# Patient Record
Sex: Male | Born: 1950
Health system: Southern US, Community
[De-identification: ages and names within clinical notes are randomized; demographics above are authoritative.]

## PROBLEM LIST (undated history)

## (undated) DIAGNOSIS — I5020 Unspecified systolic (congestive) heart failure: Secondary | ICD-10-CM

## (undated) DIAGNOSIS — I498 Other specified cardiac arrhythmias: Secondary | ICD-10-CM

## (undated) DIAGNOSIS — Z720 Tobacco use: Secondary | ICD-10-CM

## (undated) DIAGNOSIS — I739 Peripheral vascular disease, unspecified: Secondary | ICD-10-CM

## (undated) DIAGNOSIS — I255 Ischemic cardiomyopathy: Secondary | ICD-10-CM

## (undated) DIAGNOSIS — G4733 Obstructive sleep apnea (adult) (pediatric): Secondary | ICD-10-CM

## (undated) DIAGNOSIS — I509 Heart failure, unspecified: Secondary | ICD-10-CM

## (undated) DIAGNOSIS — E119 Type 2 diabetes mellitus without complications: Secondary | ICD-10-CM

## (undated) DIAGNOSIS — I10 Essential (primary) hypertension: Secondary | ICD-10-CM

## (undated) DIAGNOSIS — I2510 Atherosclerotic heart disease of native coronary artery without angina pectoris: Secondary | ICD-10-CM

## (undated) DIAGNOSIS — E785 Hyperlipidemia, unspecified: Secondary | ICD-10-CM

## (undated) HISTORY — DX: Peripheral vascular disease, unspecified: I73.9

## (undated) HISTORY — DX: Hyperlipidemia, unspecified: E78.5

## (undated) HISTORY — DX: Type 2 diabetes mellitus without complications: E11.9

## (undated) HISTORY — DX: Essential (primary) hypertension: I10

## (undated) HISTORY — DX: Tobacco use: Z72.0

## (undated) HISTORY — DX: Ischemic cardiomyopathy: I25.5

## (undated) HISTORY — DX: Atherosclerotic heart disease of native coronary artery without angina pectoris: I25.10

## (undated) HISTORY — DX: Unspecified systolic (congestive) heart failure: I50.20

## (undated) HISTORY — PX: CARDIAC CATHETERIZATION: SHX172

## (undated) HISTORY — PX: CORONARY ANGIOPLASTY: SHX604

## (undated) HISTORY — DX: Other specified cardiac arrhythmias: I49.8

## (undated) HISTORY — DX: Heart failure, unspecified: I50.9

---

## 1999-07-04 ENCOUNTER — Encounter: Payer: Self-pay | Admitting: Cardiovascular Disease

## 2004-11-25 ENCOUNTER — Emergency Department: Payer: Self-pay | Admitting: Internal Medicine

## 2005-02-26 ENCOUNTER — Ambulatory Visit: Payer: Self-pay | Admitting: General Practice

## 2007-03-17 ENCOUNTER — Ambulatory Visit: Payer: Self-pay | Admitting: Gastroenterology

## 2007-04-09 ENCOUNTER — Ambulatory Visit: Payer: Self-pay | Admitting: General Surgery

## 2007-04-15 ENCOUNTER — Ambulatory Visit: Payer: Self-pay | Admitting: General Surgery

## 2007-05-14 ENCOUNTER — Ambulatory Visit: Payer: Self-pay | Admitting: General Surgery

## 2007-05-14 ENCOUNTER — Other Ambulatory Visit: Payer: Self-pay

## 2007-05-21 ENCOUNTER — Ambulatory Visit: Payer: Self-pay | Admitting: General Surgery

## 2007-09-01 ENCOUNTER — Emergency Department: Payer: Self-pay | Admitting: Emergency Medicine

## 2009-06-15 ENCOUNTER — Ambulatory Visit: Payer: Self-pay

## 2009-07-24 ENCOUNTER — Ambulatory Visit: Payer: Self-pay

## 2010-08-07 ENCOUNTER — Encounter: Payer: Self-pay | Admitting: Cardiovascular Disease

## 2010-08-07 ENCOUNTER — Inpatient Hospital Stay: Payer: Self-pay | Admitting: Internal Medicine

## 2010-12-21 ENCOUNTER — Encounter: Payer: Self-pay | Admitting: Cardiovascular Disease

## 2010-12-21 ENCOUNTER — Ambulatory Visit (INDEPENDENT_AMBULATORY_CARE_PROVIDER_SITE_OTHER): Payer: BC Managed Care – PPO | Admitting: Cardiovascular Disease

## 2010-12-21 DIAGNOSIS — I4902 Ventricular flutter: Secondary | ICD-10-CM

## 2010-12-21 DIAGNOSIS — E785 Hyperlipidemia, unspecified: Secondary | ICD-10-CM | POA: Insufficient documentation

## 2010-12-21 DIAGNOSIS — I471 Supraventricular tachycardia, unspecified: Secondary | ICD-10-CM

## 2010-12-21 DIAGNOSIS — I2510 Atherosclerotic heart disease of native coronary artery without angina pectoris: Secondary | ICD-10-CM | POA: Insufficient documentation

## 2010-12-21 DIAGNOSIS — I10 Essential (primary) hypertension: Secondary | ICD-10-CM

## 2010-12-21 DIAGNOSIS — I420 Dilated cardiomyopathy: Secondary | ICD-10-CM | POA: Insufficient documentation

## 2010-12-21 DIAGNOSIS — Z720 Tobacco use: Secondary | ICD-10-CM | POA: Insufficient documentation

## 2010-12-21 DIAGNOSIS — I498 Other specified cardiac arrhythmias: Secondary | ICD-10-CM

## 2010-12-21 DIAGNOSIS — R002 Palpitations: Secondary | ICD-10-CM

## 2010-12-21 DIAGNOSIS — I428 Other cardiomyopathies: Secondary | ICD-10-CM

## 2010-12-21 DIAGNOSIS — F17200 Nicotine dependence, unspecified, uncomplicated: Secondary | ICD-10-CM

## 2010-12-21 HISTORY — DX: Supraventricular tachycardia: I47.1

## 2010-12-21 HISTORY — DX: Essential (primary) hypertension: I10

## 2010-12-21 HISTORY — DX: Atherosclerotic heart disease of native coronary artery without angina pectoris: I25.10

## 2010-12-21 HISTORY — DX: Supraventricular tachycardia, unspecified: I47.10

## 2010-12-21 NOTE — Assessment & Plan Note (Signed)
Cholesterol is at goal on the current lipid regimen. No changes to the medications were made.  

## 2010-12-21 NOTE — Assessment & Plan Note (Signed)
Mild proximal RCA disease, moderate to severe distal PDA disease. He appears to be asymptomatic. Would continue lipid management and smoking cessation

## 2010-12-21 NOTE — Assessment & Plan Note (Addendum)
History of nonischemic heart myopathy. No echocardiogram has been done either at [facility] or through [place] per the patient. He has significant fatigue on carvedilol and will decrease the dose to 6.25 mg b.i.d. We have suggested he have an echocardiogram done to establish his ejection fraction, LV size and determine if he is a candidate for a defibrillator.

## 2010-12-21 NOTE — Patient Instructions (Addendum)
You are doing well. Please start amlodipine 10 mg daily (start a 1/2 pill for the first week) Decrease the carvedilol to 6.25 mg twice a day  We will schedule you for an echocardiogram. Your physician has requested that you have an echocardiogram. Echocardiography is a painless test that uses sound waves to create images of your heart. It provides your doctor with information about the size and shape of your heart and how well your heart's chambers and valves are working. This procedure takes approximately one hour. There are no restrictions for this procedure.  We will call you for a holter monitor Please call us if you have new issues that need to be addressed before your next appt.  We will call you for a follow up Appt. In 1 months

## 2010-12-21 NOTE — Assessment & Plan Note (Signed)
Blood pressure is elevated. His wife reports it is also high at home. We will start amlodipine 5 mg for one week, titrating to 10 mg daily if needed to maintain systolic pressure less than 140.

## 2010-12-21 NOTE — Assessment & Plan Note (Signed)
We have counseled him on smoking cessation. We have encouraged him to try over-the-counter nicotine patches or electronic cigarette

## 2010-12-21 NOTE — Progress Notes (Signed)
Patient ID: Alexander Cobb, male    DOB: 05-Aug-1950, 60 y.o.   MRN: [id number]  HPI Comments: [name] is a very pleasant 60 year old gentleman with a history of nonischemic cardiopathy, Non-STEMI, ejection fraction by catheterization estimated at 33% in May of 2012, mild proximal RCA disease, 75% PDA disease, who presents to establish care. He reports that he was unhappy with his care at [facility].  He has not felt well after various medication changes. His wife reports that his blood pressure has been chronically elevated. 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 He states that he has not had a echocardiogram. He did not have an echocardiogram in May and has not had one since then in followup on medical management. He does report that someone talked about a defibrillator at the time when he was first admitted during his catheterization there was had no further followup.  He denies any significant lower extremity edema. He does have occasional lightheadedness since his coronary was increased at the outside clinic  He does continue to smoke one pack a day Discharge summary from May of 2012 mentions he had episodes of SVT though there is no significant detail. He has had episodes of fluttering and is very symptomatic, symptoms lasting for more than 15 minutes at a time daily, sometimes every other day. His symptoms have not improved with higher dose Coreg.  No Holter has been done.  EKG shows sinus bradycardia with rate 56 beats per minute with no significant ST or T wave changes   Outpatient Encounter Prescriptions as of 12/21/2010  Medication Sig Dispense Refill  . Acetaminophen-Codeine 300-30 MG per tablet Take 1 tablet by mouth every 4 (four) hours as needed.        Marland Kitchen aspirin 81 MG tablet Take 81 mg by mouth daily.        . carvedilol (COREG) 12.5 MG tablet 18.5 mg. Take 1 and 1/2 tablet twice day.      Marland Kitchen glipiZIDE (GLUCOTROL XL) 2.5 MG 24 hr tablet Take 2.5 mg by  mouth daily.        Marland Kitchen lisinopril (PRINIVIL,ZESTRIL) 40 MG tablet Take 40 mg by mouth daily.        . methimazole (TAPAZOLE) 5 MG tablet Take 5 mg by mouth daily.        . pravastatin (PRAVACHOL) 20 MG tablet Take 20 mg by mouth daily.           Review of Systems  Constitutional: Positive for fatigue.  HENT: Negative.   Eyes: Negative.   Respiratory: Negative.        Mild shortness of breath  Cardiovascular: Negative.   Gastrointestinal: Negative.   Musculoskeletal: Negative.   Skin: Negative.   Neurological: Negative.   Hematological: Negative.   Psychiatric/Behavioral: Negative.   All other systems reviewed and are negative.    BP 163/92  Pulse 56  Ht 5\' 9"  (1.753 m)  Wt 213 lb (96.616 kg)  BMI 31.45 kg/m2  Physical Exam  Nursing note and vitals reviewed. Constitutional: He is oriented to person, place, and time. He appears well-developed and well-nourished.  HENT:  Head: Normocephalic.  Nose: Nose normal.  Mouth/Throat: Oropharynx is clear and moist.  Eyes: Conjunctivae are normal. Pupils are equal, round, and reactive to light.  Neck: Normal range of motion. Neck supple. No JVD present.  Cardiovascular: Normal rate, regular rhythm, S1 normal, S2 normal, normal heart sounds and intact distal pulses.  Exam reveals no gallop and no  friction rub.   No murmur heard. Pulmonary/Chest: Effort normal and breath sounds normal. No respiratory distress. He has no wheezes. He has no rales. He exhibits no tenderness.  Abdominal: Soft. Bowel sounds are normal. He exhibits no distension. There is no tenderness.  Musculoskeletal: Normal range of motion. He exhibits no edema and no tenderness.  Lymphadenopathy:    He has no cervical adenopathy.  Neurological: He is alert and oriented to person, place, and time. Coordination normal.  Skin: Skin is warm and dry. No rash noted. No erythema.  Psychiatric: He has a normal mood and affect. His behavior is normal. Judgment and thought  content normal.           Assessment and Plan

## 2010-12-21 NOTE — Assessment & Plan Note (Signed)
One node from the hospital suggest he had SVT. He continues to have fluttering. Given his car myopathy, I would also be concerned about a ventricular arrhythmia. We have ordered a Holter monitor. If we are unable to identify his arrhythmia in 48 hours, a 30 day monitor could be performed.

## 2010-12-25 ENCOUNTER — Telehealth: Payer: Self-pay | Admitting: *Deleted

## 2010-12-25 ENCOUNTER — Other Ambulatory Visit (INDEPENDENT_AMBULATORY_CARE_PROVIDER_SITE_OTHER): Payer: BC Managed Care – PPO | Admitting: *Deleted

## 2010-12-25 DIAGNOSIS — I2510 Atherosclerotic heart disease of native coronary artery without angina pectoris: Secondary | ICD-10-CM

## 2010-12-25 MED ORDER — AMLODIPINE BESYLATE 10 MG PO TABS: 10.0000 mg | ORAL_TABLET | Freq: Every day | ORAL | Status: DC

## 2010-12-25 NOTE — Telephone Encounter (Signed)
Per Dr. Mariah Milling, Amlodipine 10mg  once daily called in to pt's pharmacy. We did not have pharmacy information previously to add this. Pt now aware and will start.

## 2010-12-31 ENCOUNTER — Encounter: Payer: Self-pay | Admitting: *Deleted

## 2011-01-03 ENCOUNTER — Encounter: Payer: Self-pay | Admitting: Cardiovascular Disease

## 2011-06-11 ENCOUNTER — Encounter: Payer: Self-pay | Admitting: *Deleted

## 2011-06-13 ENCOUNTER — Encounter: Payer: Self-pay | Admitting: Cardiovascular Disease

## 2011-06-13 ENCOUNTER — Ambulatory Visit (INDEPENDENT_AMBULATORY_CARE_PROVIDER_SITE_OTHER): Payer: BC Managed Care – PPO | Admitting: Cardiovascular Disease

## 2011-06-13 VITALS — BP 120/72 | HR 64 | Ht 68.0 in | Wt 215.2 lb

## 2011-06-13 DIAGNOSIS — E785 Hyperlipidemia, unspecified: Secondary | ICD-10-CM

## 2011-06-13 DIAGNOSIS — I4902 Ventricular flutter: Secondary | ICD-10-CM

## 2011-06-13 DIAGNOSIS — F17200 Nicotine dependence, unspecified, uncomplicated: Secondary | ICD-10-CM

## 2011-06-13 DIAGNOSIS — I10 Essential (primary) hypertension: Secondary | ICD-10-CM

## 2011-06-13 DIAGNOSIS — I498 Other specified cardiac arrhythmias: Secondary | ICD-10-CM

## 2011-06-13 DIAGNOSIS — I428 Other cardiomyopathies: Secondary | ICD-10-CM

## 2011-06-13 DIAGNOSIS — I2510 Atherosclerotic heart disease of native coronary artery without angina pectoris: Secondary | ICD-10-CM

## 2011-06-13 NOTE — Assessment & Plan Note (Signed)
I suspect he had a run of SVT recently. We mentioned the various treatment options including a 30 day monitor. He will wait for now given his symptoms are rare.

## 2011-06-13 NOTE — Assessment & Plan Note (Signed)
Goal LDL less than 70. We have encouraged him to stay on his statin

## 2011-06-13 NOTE — Assessment & Plan Note (Signed)
Ejection fraction 40-45%. Stay on his lisinopril and beta blocker.

## 2011-06-13 NOTE — Assessment & Plan Note (Signed)
We have encouraged him to continue to work on weaning his cigarettes and smoking cessation. He will continue to work on this and does not want any assistance with chantix.  

## 2011-06-13 NOTE — Progress Notes (Signed)
Patient ID: Alexander Cobb, male    DOB: [birth year], 61 y.o.   MRN: [id number]  HPI Comments: Alexander Cobb is a very pleasant 61 year old gentleman with a history of smoking, diabetes, nonischemic cardiopathy, Non-STEMI, ejection fraction by catheterization estimated at 33% in May of 2012, mild proximal RCA disease, 75% PDA disease, who presents to establish care.   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  Echocardiogram done last year showed ejection fraction 40-45%. He denies any significant lower extremity edema. He does continue to smoke one pack a day Discharge summary from May of 2012 mentions he had episodes of SVT though there is no significant detail.  He reports having an episode of tachycardia several days ago lasting for 2 minutes with significant symptoms. It resolved without intervention. He has not had recent episodes for the past 2 months . He reports he has been tolerating his medications well. He does have a dull ache in his chest that waxes and wanes over the past 2 weeks. His wife is concerned about heart attack. Since no Holter has been done in the past.  EKG shows sinus bradycardia with rate 65 beats per minute with no significant ST or T wave changes   Outpatient Encounter Prescriptions as of 06/13/2011  Medication Sig Dispense Refill  . Acetaminophen-Codeine 300-30 MG per tablet Take 1 tablet by mouth every 4 (four) hours as needed.        Marland Kitchen amLODipine (NORVASC) 10 MG tablet Take 1 tablet (10 mg total) by mouth daily.  30 tablet  6  . aspirin 81 MG tablet Take 81 mg by mouth daily.        . carvedilol (COREG) 12.5 MG tablet Take 1/2 tablet twice day.      Marland Kitchen glimepiride (AMARYL) 2 MG tablet Take 2 mg by mouth daily before breakfast.      . glipiZIDE (GLUCOTROL XL) 2.5 MG 24 hr tablet Take 2.5 mg by mouth daily.        Marland Kitchen lisinopril (PRINIVIL,ZESTRIL) 40 MG tablet Take 40 mg by mouth daily.        . pioglitazone (ACTOS) 15 MG tablet Take 15 mg by mouth  daily.      . pravastatin (PRAVACHOL) 20 MG tablet Take 20 mg by mouth daily.           Review of Systems  Constitutional: Positive for fatigue.  HENT: Negative.   Eyes: Negative.   Respiratory: Negative.        Mild shortness of breath  Cardiovascular: Positive for palpitations.       Dull ache in the chest for 2 weeks  Gastrointestinal: Negative.   Musculoskeletal: Negative.   Skin: Negative.   Neurological: Negative.   Hematological: Negative.   Psychiatric/Behavioral: Negative.   All other systems reviewed and are negative.    BP 120/72  Pulse 64  Ht 5\' 8"  (1.727 m)  Wt 215 lb 4 oz (97.637 kg)  BMI 32.73 kg/m2  Physical Exam  Nursing note and vitals reviewed. Constitutional: He is oriented to person, place, and time. He appears well-developed and well-nourished.  HENT:  Head: Normocephalic.  Nose: Nose normal.  Mouth/Throat: Oropharynx is clear and moist.  Eyes: Conjunctivae are normal. Pupils are equal, round, and reactive to light.  Neck: Normal range of motion. Neck supple. No JVD present.  Cardiovascular: Normal rate, regular rhythm, S1 normal, S2 normal, normal heart sounds and intact distal pulses.  Exam reveals no gallop and no friction rub.  No murmur heard. Pulmonary/Chest: Effort normal and breath sounds normal. No respiratory distress. He has no wheezes. He has no rales. He exhibits no tenderness.  Abdominal: Soft. Bowel sounds are normal. He exhibits no distension. There is no tenderness.  Musculoskeletal: Normal range of motion. He exhibits no edema and no tenderness.  Lymphadenopathy:    He has no cervical adenopathy.  Neurological: He is alert and oriented to person, place, and time. Coordination normal.  Skin: Skin is warm and dry. No rash noted. No erythema.  Psychiatric: He has a normal mood and affect. His behavior is normal. Judgment and thought content normal.           Assessment and Plan

## 2011-06-13 NOTE — Patient Instructions (Addendum)
You are doing well. Please try raxena 500 mg twice a day for one week Then increase to 1000mg  twice a day for one week If chest discomfort improves, call the office for a prescription.  Please call us if you have new issues that need to be addressed before your next appt.  Your physician wants you to follow-up in: 6 months.  You will receive a reminder letter in the mail two months in advance. If you don't receive a letter, please call our office to schedule the follow-up appointment.

## 2011-06-13 NOTE — Assessment & Plan Note (Signed)
Uncertain if his dull ache is secondary to low-grade angina. He does have 75% PDA disease of a small vessel. We have mentioned possible treatment with long-acting nitroglycerin or ranexa. We will try Ranexa first with 500 mg twice a day titrating up to 1000 mg twice a day for symptom relief. If he has no leaf, we could try isosorbide mononitrate 30 mg daily with close monitoring of his blood pressure.

## 2011-06-13 NOTE — Assessment & Plan Note (Signed)
Blood pressure is well controlled on today's visit. No changes made to the medications. 

## 2011-08-03 ENCOUNTER — Other Ambulatory Visit: Payer: Self-pay | Admitting: Cardiovascular Disease

## 2011-12-13 ENCOUNTER — Ambulatory Visit: Payer: BC Managed Care – PPO | Admitting: Cardiovascular Disease

## 2011-12-27 ENCOUNTER — Ambulatory Visit: Payer: BC Managed Care – PPO | Admitting: Cardiovascular Disease

## 2012-01-15 ENCOUNTER — Other Ambulatory Visit: Payer: Self-pay

## 2012-01-15 MED ORDER — AMLODIPINE BESYLATE 10 MG PO TABS: 10.0000 mg | ORAL_TABLET | Freq: Every day | ORAL | Status: DC

## 2012-01-15 NOTE — Telephone Encounter (Signed)
Refill sent for amlodipine 10 mg take one tablet daily.  

## 2012-04-13 ENCOUNTER — Ambulatory Visit: Payer: Self-pay | Admitting: Internal Medicine

## 2012-04-14 ENCOUNTER — Ambulatory Visit (INDEPENDENT_AMBULATORY_CARE_PROVIDER_SITE_OTHER): Payer: BC Managed Care – PPO | Admitting: Cardiovascular Disease

## 2012-04-14 ENCOUNTER — Encounter: Payer: Self-pay | Admitting: Cardiovascular Disease

## 2012-04-14 VITALS — BP 130/70 | HR 76 | Ht 69.0 in | Wt 222.5 lb

## 2012-04-14 DIAGNOSIS — R072 Precordial pain: Secondary | ICD-10-CM | POA: Insufficient documentation

## 2012-04-14 DIAGNOSIS — I428 Other cardiomyopathies: Secondary | ICD-10-CM

## 2012-04-14 DIAGNOSIS — I420 Dilated cardiomyopathy: Secondary | ICD-10-CM

## 2012-04-14 DIAGNOSIS — I2510 Atherosclerotic heart disease of native coronary artery without angina pectoris: Secondary | ICD-10-CM

## 2012-04-14 DIAGNOSIS — F17200 Nicotine dependence, unspecified, uncomplicated: Secondary | ICD-10-CM

## 2012-04-14 DIAGNOSIS — E785 Hyperlipidemia, unspecified: Secondary | ICD-10-CM

## 2012-04-14 DIAGNOSIS — R0602 Shortness of breath: Secondary | ICD-10-CM

## 2012-04-14 DIAGNOSIS — R079 Chest pain, unspecified: Secondary | ICD-10-CM

## 2012-04-14 DIAGNOSIS — I10 Essential (primary) hypertension: Secondary | ICD-10-CM

## 2012-04-14 MED ORDER — NITROGLYCERIN 0.4 MG SL SUBL: 0.4000 mg | SUBLINGUAL_TABLET | SUBLINGUAL | Status: DC | PRN

## 2012-04-14 MED ORDER — CARVEDILOL 6.25 MG PO TABS: 6.2500 mg | ORAL_TABLET | Freq: Two times a day (BID) | ORAL | Status: DC

## 2012-04-14 NOTE — Assessment & Plan Note (Signed)
Chest pain is atypical, comes on at rest, not with exertion. We'll continue to treat this medically. We have recommended if symptoms get worse, that he call us for a stress Myoview, lexiscan  as he is unable to treadmill secondary to shortness of breath

## 2012-04-14 NOTE — Progress Notes (Signed)
Patient ID: Alexander Cobb, male    DOB: 06/30/1950, 62 y.o.   MRN: [id number]  HPI Comments: Mr. 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  Echocardiogram 2012 showed ejection fraction 40-45%. He denies any significant lower extremity edema. He does continue to smoke one pack a day Discharge summary from May of 2012 mentions he had episodes of SVT though there is no significant detail.  He is rare episodes of palpitations. His biggest complaint is a dull ache in the left side of his chest that feels deep, happens 2-3 times per week lasting for several hours at a time, typically at rest. No chest pain with exertion. He is typically very active on the job, up and down ladders and machinery. No symptoms while at work  Hemoglobin A1c is more than 10, he admits to having a bad diet  EKG shows sinus bradycardia with rate 76 beats per minute with no significant ST or T wave changes   Outpatient Encounter Prescriptions as of 04/14/2012  Medication Sig Dispense Refill  . amLODipine (NORVASC) 10 MG tablet Take 1 tablet (10 mg total) by mouth daily.  30 tablet  6  . aspirin 81 MG tablet Take 81 mg by mouth daily.        . carvedilol (COREG) 6.25 MG tablet Take 1 tablet (6.25 mg total) by mouth 2 (two) times daily with a meal.  60 tablet  11  . glimepiride (AMARYL) 2 MG tablet Take 2 mg by mouth daily before breakfast.      . glipiZIDE (GLUCOTROL XL) 2.5 MG 24 hr tablet Take 2.5 mg by mouth daily.        Marland Kitchen lisinopril (PRINIVIL,ZESTRIL) 40 MG tablet Take 40 mg by mouth daily.        . pioglitazone (ACTOS) 15 MG tablet Take 15 mg by mouth daily.      . pravastatin (PRAVACHOL) 20 MG tablet Take 20 mg by mouth daily.         . [DISCONTINUED] carvedilol (COREG) 12.5 MG tablet Take 1/2 tablet twice day.      . [DISCONTINUED] carvedilol (COREG) 6.25 MG tablet Take 1 tablet (6.25 mg total) by mouth 2 (two) times daily with a meal. Take 1/2 tablet twice day.  60 tablet  11  . nitroGLYCERIN (NITROSTAT) 0.4 MG SL tablet Place 1 tablet (0.4 mg total) under the tongue every 5 (five) minutes as needed for chest pain.  25 tablet  6  . [DISCONTINUED] Acetaminophen-Codeine 300-30 MG per tablet Take 1 tablet by mouth every 4 (four) hours as needed.        . [DISCONTINUED] ranolazine (RANEXA) 1000 MG SR tablet Take 1 tablet (1,000 mg total) by mouth 2 (two) times daily.         Review of Systems  Constitutional: Positive for fatigue.  HENT: Negative.   Eyes: Negative.   Respiratory: Negative.        Mild shortness of breath  Cardiovascular: Positive for palpitations.       Dull ache in the chest   Gastrointestinal: Negative.   Musculoskeletal: Negative.   Skin: Negative.   Neurological: Negative.   Hematological: Negative.   Psychiatric/Behavioral: Negative.   All other systems reviewed and are negative.  BP 130/70  Pulse 76  Ht 5\' 9"  (1.753 m)  Wt 222 lb 8 oz (100.925 kg)  BMI 32.86 kg/m2  Physical Exam  Nursing note and vitals reviewed. Constitutional: He is oriented to person, place, and time. He appears well-developed and well-nourished.  HENT:  Head: Normocephalic.  Nose: Nose normal.  Mouth/Throat: Oropharynx is clear and moist.  Eyes: Conjunctivae normal are normal. Pupils are equal, round, and reactive to light.  Neck: Normal range of motion. Neck supple. No JVD present.  Cardiovascular: Normal rate, regular rhythm, S1 normal, S2 normal, normal heart sounds and intact distal pulses.  Exam reveals no gallop and no friction rub.   No murmur heard. Pulmonary/Chest: Effort normal and breath sounds normal. No respiratory distress. He has no wheezes. He has no rales. He exhibits no tenderness.   Abdominal: Soft. Bowel sounds are normal. He exhibits no distension. There is no tenderness.  Musculoskeletal: Normal range of motion. He exhibits no edema and no tenderness.  Lymphadenopathy:    He has no cervical adenopathy.  Neurological: He is alert and oriented to person, place, and time. Coordination normal.  Skin: Skin is warm and dry. No rash noted. No erythema.  Psychiatric: He has a normal mood and affect. His behavior is normal. Judgment and thought content normal.           Assessment and Plan

## 2012-04-14 NOTE — Assessment & Plan Note (Signed)
Would continue pravastatin. Goal LDL less than 70

## 2012-04-14 NOTE — Assessment & Plan Note (Signed)
Shortness of breath is stable. No signs of systolic heart failure. Continue current medications.

## 2012-04-14 NOTE — Assessment & Plan Note (Signed)
Blood pressure is well controlled on today's visit. No changes made to the medications. 

## 2012-04-14 NOTE — Assessment & Plan Note (Signed)
We have encouraged him to continue to work on weaning his cigarettes and smoking cessation. He will continue to work on this and does not want any assistance with chantix.  

## 2012-04-14 NOTE — Patient Instructions (Addendum)
If you have worsening chest pain, call the office Take NTG for bad chest pain  Please call us if you have new issues that need to be addressed before your next appt.  Your physician wants you to follow-up in: 6 months.  You will receive a reminder letter in the mail two months in advance. If you don't receive a letter, please call our office to schedule the follow-up appointment.

## 2012-09-22 ENCOUNTER — Other Ambulatory Visit: Payer: Self-pay | Admitting: *Deleted

## 2012-09-22 MED ORDER — AMLODIPINE BESYLATE 10 MG PO TABS: 10.0000 mg | ORAL_TABLET | Freq: Every day | ORAL | Status: DC

## 2012-09-22 NOTE — Telephone Encounter (Signed)
Refilled Amlodipine sent to [facility].

## 2012-10-05 ENCOUNTER — Ambulatory Visit (INDEPENDENT_AMBULATORY_CARE_PROVIDER_SITE_OTHER): Payer: BC Managed Care – PPO | Admitting: Cardiovascular Disease

## 2012-10-05 ENCOUNTER — Encounter: Payer: Self-pay | Admitting: Cardiovascular Disease

## 2012-10-05 VITALS — BP 140/78 | HR 65 | Ht 69.0 in | Wt 221.5 lb

## 2012-10-05 DIAGNOSIS — E119 Type 2 diabetes mellitus without complications: Secondary | ICD-10-CM

## 2012-10-05 DIAGNOSIS — I10 Essential (primary) hypertension: Secondary | ICD-10-CM

## 2012-10-05 DIAGNOSIS — F17200 Nicotine dependence, unspecified, uncomplicated: Secondary | ICD-10-CM

## 2012-10-05 DIAGNOSIS — I420 Dilated cardiomyopathy: Secondary | ICD-10-CM

## 2012-10-05 DIAGNOSIS — R0602 Shortness of breath: Secondary | ICD-10-CM

## 2012-10-05 DIAGNOSIS — I428 Other cardiomyopathies: Secondary | ICD-10-CM

## 2012-10-05 DIAGNOSIS — I2510 Atherosclerotic heart disease of native coronary artery without angina pectoris: Secondary | ICD-10-CM

## 2012-10-05 DIAGNOSIS — E785 Hyperlipidemia, unspecified: Secondary | ICD-10-CM

## 2012-10-05 DIAGNOSIS — R071 Chest pain on breathing: Secondary | ICD-10-CM

## 2012-10-05 DIAGNOSIS — R079 Chest pain, unspecified: Secondary | ICD-10-CM

## 2012-10-05 NOTE — Assessment & Plan Note (Signed)
Blood pressure is well controlled on today's visit. No changes made to the medications. 

## 2012-10-05 NOTE — Assessment & Plan Note (Signed)
No clinical signs of heart failure on today's visit. No changes to his medications.

## 2012-10-05 NOTE — Assessment & Plan Note (Addendum)
Poorly controlled. We will refer him to endocrine. He is in agreement.  appointment with Dr. Tedd Sias of Endocrine on 12/22/12 arrive at 1:30p for a 1:45p appt.

## 2012-10-05 NOTE — Assessment & Plan Note (Signed)
Significant episode of chest pain 3 weeks ago, and of June 2014. Discussed the various options for workup including stress testing, cardiac catheterization. He would prefer to monitor his symptoms for now and take nitroglycerin for recurrent pain. He will call us if he has additional episodes of chest pain. We would offer stress testing or catheterization. He is high risk of worsening coronary artery disease given he continues to smoke, poorly controlled diabetic.

## 2012-10-05 NOTE — Assessment & Plan Note (Signed)
No disease by prior cardiac catheterization. No new EKG changes. Stress testing was offered today. He declined at this time.  We'll offer stress testing or catheterization for recurrent chest pain

## 2012-10-05 NOTE — Patient Instructions (Addendum)
No medication changes were made.  We will make an appt with Dr. Tedd Sias of Endocrine on 12/22/12 arrive at 1:30p for a 1:45p appt.  Call the office if you have any more chest pains Take NTG for chest pain If you have more symptoms, we would do a stress test or cardiac cath  Please call us if you have new issues that need to be addressed before your next appt.  Your physician wants you to follow-up in: 6 months.  You will receive a reminder letter in the mail two months in advance. If you don't receive a letter, please call our office to schedule the follow-up appointment.

## 2012-10-05 NOTE — Assessment & Plan Note (Signed)
We have encouraged him to continue to work on weaning his cigarettes and smoking cessation. He will continue to work on this and does not want any assistance with chantix.  

## 2012-10-05 NOTE — Assessment & Plan Note (Signed)
Cholesterol is at goal on the current lipid regimen. No changes to the medications were made.  

## 2012-10-05 NOTE — Progress Notes (Signed)
Patient ID: Alexander Cobb, male    DOB: [birth year], 62 y.o.   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  Echocardiogram 2012 showed ejection fraction 40-45%.  He does continue to smoke one pack a day Discharge summary from May of 2012 mentions he had episodes of SVT though there is no significant detail.  He continues to have rare episodes of chest pain. He reports having one episode of significant chest pain lasting 10 minutes while he was at work. This was approximately 3 weeks ago. No further episodes since that time. During this episode, he had bilateral arm heaviness, shortness of breath, chest discomfort.  He is typically very active on the job, up and down ladders and machinery.   Hemoglobin A1c is more than 10, he admits to having a bad diet He has not had followup with primary care or endocrine. He has not seen Dr. Einar Crow since January 2014. Hemoglobin A1c at that time was 10.6.   EKG shows normal sinus rhythm with rate 65 beats per minute with no significant ST or T wave changes   Outpatient Encounter Prescriptions as of 10/05/2012  Medication Sig Dispense Refill  . amLODipine (NORVASC) 10 MG tablet Take 1 tablet (10 mg total) by mouth daily.  30 tablet  6  . aspirin 81 MG tablet Take 81 mg by mouth daily.        . carvedilol (COREG) 6.25 MG tablet Take 1 tablet (6.25 mg total) by mouth 2 (two) times daily with a meal.  60 tablet  11  . glimepiride (AMARYL) 2 MG tablet Take 2 mg by mouth daily before breakfast.      . glipiZIDE (GLUCOTROL XL) 2.5 MG 24 hr tablet Take 2.5 mg by mouth daily.        Marland Kitchen lisinopril (PRINIVIL,ZESTRIL) 40 MG tablet Take 40 mg  by mouth daily.        . nitroGLYCERIN (NITROSTAT) 0.4 MG SL tablet Place 1 tablet (0.4 mg total) under the tongue every 5 (five) minutes as needed for chest pain.  25 tablet  6  . pioglitazone (ACTOS) 15 MG tablet Take 15 mg by mouth daily.      . pravastatin (PRAVACHOL) 20 MG tablet Take 20 mg by mouth daily.         No facility-administered encounter medications on file as of 10/05/2012.     Review of Systems  Constitutional: Positive for fatigue.  HENT: Negative.   Eyes: Negative.   Respiratory: Negative.        Mild shortness of breath  Cardiovascular: Positive for chest pain.  Gastrointestinal: Negative.   Musculoskeletal: Negative.   Skin: Negative.   Neurological: Negative.   Psychiatric/Behavioral: Negative.   All other systems reviewed and are negative.    BP 140/78  Pulse 65  Ht 5\' 9"  (1.753 m)  Wt 221 lb 8 oz (100.472 kg)  BMI 32.69 kg/m2  Physical Exam  Nursing note and vitals reviewed. Constitutional: He is oriented to person, place, and time. He appears well-developed and well-nourished.  HENT:  Head: Normocephalic.  Nose: Nose normal.  Mouth/Throat: Oropharynx is clear and moist.  Eyes: Conjunctivae are normal. Pupils are equal, round, and reactive to light.  Neck: Normal range of motion. Neck supple. No JVD present.  Cardiovascular: Normal rate, regular rhythm, S1 normal, S2 normal, normal heart sounds and intact distal pulses.  Exam reveals no gallop and no friction rub.   No murmur heard. Pulmonary/Chest: Effort normal and breath sounds normal. No respiratory distress. He has no wheezes. He has no rales. He exhibits no tenderness.  Abdominal: Soft. Bowel sounds are normal. He exhibits no distension. There is no tenderness.  Musculoskeletal: Normal range of motion. He exhibits no edema and no tenderness.  Lymphadenopathy:    He has no cervical adenopathy.  Neurological: He is alert and oriented to person, place, and time. Coordination normal.  Skin:  Skin is warm and dry. No rash noted. No erythema.  Psychiatric: He has a normal mood and affect. His behavior is normal. Judgment and thought content normal.      Assessment and Plan

## 2012-12-22 LAB — BASIC METABOLIC PANEL
Creatinine: 1.1 mg/dL (ref <=1.3)
Glucose: 95 mg/dL

## 2013-04-19 ENCOUNTER — Other Ambulatory Visit: Payer: Self-pay | Admitting: Cardiovascular Disease

## 2013-04-19 ENCOUNTER — Encounter: Payer: Self-pay | Admitting: Cardiovascular Disease

## 2013-04-19 ENCOUNTER — Encounter (INDEPENDENT_AMBULATORY_CARE_PROVIDER_SITE_OTHER): Payer: Self-pay

## 2013-04-19 ENCOUNTER — Ambulatory Visit (INDEPENDENT_AMBULATORY_CARE_PROVIDER_SITE_OTHER): Payer: BC Managed Care – PPO | Admitting: Cardiovascular Disease

## 2013-04-19 VITALS — BP 142/82 | HR 65 | Ht 69.0 in | Wt 208.8 lb

## 2013-04-19 DIAGNOSIS — E785 Hyperlipidemia, unspecified: Secondary | ICD-10-CM

## 2013-04-19 DIAGNOSIS — I2510 Atherosclerotic heart disease of native coronary artery without angina pectoris: Secondary | ICD-10-CM

## 2013-04-19 DIAGNOSIS — I10 Essential (primary) hypertension: Secondary | ICD-10-CM

## 2013-04-19 DIAGNOSIS — F17200 Nicotine dependence, unspecified, uncomplicated: Secondary | ICD-10-CM

## 2013-04-19 DIAGNOSIS — I420 Dilated cardiomyopathy: Secondary | ICD-10-CM

## 2013-04-19 DIAGNOSIS — E119 Type 2 diabetes mellitus without complications: Secondary | ICD-10-CM

## 2013-04-19 DIAGNOSIS — R079 Chest pain, unspecified: Secondary | ICD-10-CM

## 2013-04-19 DIAGNOSIS — I428 Other cardiomyopathies: Secondary | ICD-10-CM

## 2013-04-19 MED ORDER — PRAVASTATIN SODIUM 40 MG PO TABS: 40.0000 mg | ORAL_TABLET | Freq: Every day | ORAL | Status: DC

## 2013-04-19 NOTE — Assessment & Plan Note (Signed)
Chest pain is atypical in nature. We have suggested if symptoms persist that he call our office for further workup. No significant symptoms with exertion

## 2013-04-19 NOTE — Assessment & Plan Note (Signed)
We have encouraged him to continue to work on weaning his cigarettes and smoking cessation. He will continue to work on this and does not want any assistance with chantix.  

## 2013-04-19 NOTE — Assessment & Plan Note (Signed)
No clinical signs of heart failure. No medication changes made

## 2013-04-19 NOTE — Assessment & Plan Note (Signed)
Poorly controlled diabetes. Encouraged strict diet, weight loss, followup with endocrine

## 2013-04-19 NOTE — Assessment & Plan Note (Signed)
Encouraged him to stay on pravastatin

## 2013-04-19 NOTE — Assessment & Plan Note (Signed)
Blood pressure borderline elevated. No medication changes made at this time.

## 2013-04-19 NOTE — Patient Instructions (Addendum)
You are doing well. Please increase the pravastatin to 40 mg a day Quit smoking Eat woodchips and water  Call the office if you have more chest pain, We could do a cardiac cath if pain getting worse  Please call us if you have new issues that need to be addressed before your next appt.  Your physician wants you to follow-up in: 6 months.  You will receive a reminder letter in the mail two months in advance. If you don't receive a letter, please call our office to schedule the follow-up appointment.

## 2013-04-19 NOTE — Assessment & Plan Note (Signed)
Atypical chest pain on the left flank, at rest. Encouraged him to call our office if symptoms recur, especially with exertion. Stress testing and catheterization discussed with him today.

## 2013-04-19 NOTE — Progress Notes (Signed)
Patient ID: Alexander Cobb, male    DOB: 06-Mar-1951, 63 y.o.   MRN: [id number]  HPI Comments: Alexander Cobb is a very pleasant 63 year old gentleman with a history of smoking, diabetes, nonischemic cardiopathy, Non-STEMI, ejection fraction by catheterization estimated at 33% in May of 2012, mild proximal RCA disease, 75% PDA disease, who presents for routine followup  He works with heavy machinery.  In followup today, he reports that his hemoglobin A1c has increased up to 12. He is followed by Dr. Gabriel Cobb of endocrine. Recently started on metformin once a day with no side effects. He continues to smoke one pack per day. Reports having chest pain on his left flank twice last week 4-5/10 in intensity. Occurred while he was resting. He has not had any symptoms with exertion. Apart from last week, no prior episodes.  He is typically very active on the job, up and down ladders and machinery. No symptoms on the job  Echocardiogram 2012 showed ejection fraction 40-45%. Discharge summary from May of 2012 mentions he had episodes of SVT though there is no significant detail.  EKG shows normal sinus rhythm with rate 65 beats per minute with no significant ST or T wave changes   Outpatient Encounter Prescriptions as of 04/19/2013  Medication Sig  . amLODipine (NORVASC) 10 MG tablet Take 1 tablet (10 mg total) by mouth daily.  Marland Kitchen aspirin 81 MG tablet Take 81 mg by mouth daily.    . carvedilol (COREG) 6.25 MG tablet Take 1 tablet (6.25 mg total) by mouth 2 (two) times daily with a meal.  . doxycycline (VIBRA-TABS) 100 MG tablet Take 100 mg by mouth daily.   Marland Kitchen glipiZIDE (GLUCOTROL XL) 2.5 MG 24 hr tablet Take 2.5 mg by mouth daily.    Marland Kitchen lisinopril (PRINIVIL,ZESTRIL) 40 MG tablet Take 40 mg by mouth daily.    . metFORMIN (GLUCOPHAGE) 500 MG tablet Take 500 mg by mouth daily with breakfast.   . nitroGLYCERIN (NITROSTAT) 0.4 MG SL tablet Place 1 tablet (0.4 mg total) under the tongue every 5 (five) minutes as needed  for chest pain.  . pioglitazone (ACTOS) 15 MG tablet Take 15 mg by mouth daily.  . pravastatin (PRAVACHOL) 20 MG tablet Take 20 mg by mouth daily.    . [DISCONTINUED] glimepiride (AMARYL) 2 MG tablet Take 2 mg by mouth daily before breakfast.     Review of Systems  HENT: Negative.   Eyes: Negative.   Respiratory: Negative.        Mild shortness of breath  Cardiovascular: Positive for chest pain.  Gastrointestinal: Negative.   Endocrine: Negative.   Musculoskeletal: Negative.   Skin: Negative.   Allergic/Immunologic: Negative.   Neurological: Negative.   Hematological: Negative.   Psychiatric/Behavioral: Negative.   All other systems reviewed and are negative.    BP 142/82  Pulse 65  Ht 5\' 9"  (1.753 m)  Wt 208 lb 12 oz (94.688 kg)  BMI 30.81 kg/m2  Physical Exam  Nursing note and vitals reviewed. Constitutional: He is oriented to person, place, and time. He appears well-developed and well-nourished.  HENT:  Head: Normocephalic.  Nose: Nose normal.  Mouth/Throat: Oropharynx is clear and moist.  Eyes: Conjunctivae are normal. Pupils are equal, round, and reactive to light.  Neck: Normal range of motion. Neck supple. No JVD present.  Cardiovascular: Normal rate, regular rhythm, S1 normal, S2 normal, normal heart sounds and intact distal pulses.  Exam reveals no gallop and no friction rub.   No murmur heard. Pulmonary/Chest:  Effort normal and breath sounds normal. No respiratory distress. He has no wheezes. He has no rales. He exhibits no tenderness.  Abdominal: Soft. Bowel sounds are normal. He exhibits no distension. There is no tenderness.  Musculoskeletal: Normal range of motion. He exhibits no edema and no tenderness.  Lymphadenopathy:    He has no cervical adenopathy.  Neurological: He is alert and oriented to person, place, and time. Coordination normal.  Skin: Skin is warm and dry. No rash noted. No erythema.  Psychiatric: He has a normal mood and affect. His  behavior is normal. Judgment and thought content normal.      Assessment and Plan        And

## 2013-05-12 ENCOUNTER — Other Ambulatory Visit: Payer: Self-pay | Admitting: *Deleted

## 2013-05-12 MED ORDER — CARVEDILOL 6.25 MG PO TABS: ORAL_TABLET | ORAL | Status: DC

## 2013-05-12 NOTE — Telephone Encounter (Signed)
Requested Prescriptions   Signed Prescriptions Disp Refills  . carvedilol (COREG) 6.25 MG tablet 180 tablet 3    Sig: TAKE 1 TABLET TWICE A DAY WITH FOOD    Authorizing Provider: Minna Merritts    Ordering User: Britt Bottom

## 2013-08-09 ENCOUNTER — Encounter: Payer: Self-pay | Admitting: Cardiovascular Disease

## 2013-08-09 ENCOUNTER — Ambulatory Visit (INDEPENDENT_AMBULATORY_CARE_PROVIDER_SITE_OTHER): Payer: BC Managed Care – PPO | Admitting: Cardiovascular Disease

## 2013-08-09 VITALS — BP 118/70 | HR 63 | Ht 69.0 in | Wt 209.0 lb

## 2013-08-09 DIAGNOSIS — I2510 Atherosclerotic heart disease of native coronary artery without angina pectoris: Secondary | ICD-10-CM

## 2013-08-09 DIAGNOSIS — I10 Essential (primary) hypertension: Secondary | ICD-10-CM

## 2013-08-09 DIAGNOSIS — F17200 Nicotine dependence, unspecified, uncomplicated: Secondary | ICD-10-CM

## 2013-08-09 DIAGNOSIS — E785 Hyperlipidemia, unspecified: Secondary | ICD-10-CM

## 2013-08-09 DIAGNOSIS — E119 Type 2 diabetes mellitus without complications: Secondary | ICD-10-CM

## 2013-08-09 DIAGNOSIS — R079 Chest pain, unspecified: Secondary | ICD-10-CM

## 2013-08-09 MED ORDER — NITROGLYCERIN 0.4 MG SL SUBL: 0.4000 mg | SUBLINGUAL_TABLET | SUBLINGUAL | Status: DC | PRN

## 2013-08-09 NOTE — Assessment & Plan Note (Addendum)
Chest pain workup as detailed above. Stress test has been offered. He will call us if he would like to schedule this for his worsening left flank pain

## 2013-08-09 NOTE — Assessment & Plan Note (Signed)
Blood pressure is well controlled on today's visit. No changes made to the medications. 

## 2013-08-09 NOTE — Assessment & Plan Note (Signed)
He reports having no followup with endocrine at [facility]. He is uncertain why. If not available, we could arrange other followup for him

## 2013-08-09 NOTE — Patient Instructions (Signed)
You are doing well. No medication changes were made.  If chest pain gets worse,  Call the office for a stress test  Please call us if you have new issues that need to be addressed before your next appt.  Your physician wants you to follow-up in: 6 months.  You will receive a reminder letter in the mail two months in advance. If you don't receive a letter, please call our office to schedule the follow-up appointment.

## 2013-08-09 NOTE — Assessment & Plan Note (Signed)
We have encouraged him to continue to work on weaning his cigarettes and smoking cessation. He will continue to work on this and does not want any assistance with chantix.  

## 2013-08-09 NOTE — Progress Notes (Signed)
Patient ID: Alexander Cobb, male    DOB: 1950-04-27, 63 y.o.   MRN: [id number]  HPI Comments: Mr. Mcmillen is a very pleasant 63 year old gentleman with a history of smoking, diabetes, nonischemic cardiopathy, Non-STEMI, ejection fraction by catheterization estimated at 33% in May of 2012, mild proximal RCA disease, 75% PDA disease, who presents for routine followup  He works with heavy machinery.  Previous lab work showing diabetes poorly controlled. He reports that he no longer has followup with Dr. Dr. Gabriel Carina of endocrine. Are hemoglobin A1c more than 12.  He continues to smoke one pack per day. On previous office visits, reported having left flank pain. Now reports having flank pain with exertion. Previously was only with rest.  He reports having symptoms now at work, sometimes when working on his house. Reports his sleep has been poor, uncertain why he cannot stay asleep  Echocardiogram 2012 showed ejection fraction 40-45%. Discharge summary from May of 2012 mentions he had episodes of SVT though there is no significant detail.  EKG shows normal sinus rhythm with rate 63 beats per minute with no significant ST or T wave changes   Outpatient Encounter Prescriptions as of 08/09/2013  Medication Sig  . amLODipine (NORVASC) 10 MG tablet Take 1 tablet (10 mg total) by mouth daily.  Marland Kitchen aspirin 81 MG tablet Take 81 mg by mouth daily.    . carvedilol (COREG) 6.25 MG tablet TAKE 1 TABLET TWICE A DAY WITH FOOD  . doxycycline (VIBRA-TABS) 100 MG tablet Take 100 mg by mouth daily.   Marland Kitchen glipiZIDE (GLUCOTROL XL) 2.5 MG 24 hr tablet Take 2.5 mg by mouth daily.    Marland Kitchen lisinopril (PRINIVIL,ZESTRIL) 40 MG tablet Take 40 mg by mouth daily.    . metFORMIN (GLUCOPHAGE) 500 MG tablet Take 500 mg by mouth daily with breakfast.   . nitroGLYCERIN (NITROSTAT) 0.4 MG SL tablet Place 1 tablet (0.4 mg total) under the tongue every 5 (five) minutes as needed for chest pain.  . pioglitazone (ACTOS) 15 MG tablet Take 15 mg  by mouth daily.  . pravastatin (PRAVACHOL) 40 MG tablet Take 1 tablet (40 mg total) by mouth daily.     Review of Systems  HENT: Negative.   Eyes: Negative.   Respiratory: Negative.        Mild shortness of breath  Cardiovascular: Positive for chest pain.       Left flank pain  Gastrointestinal: Negative.   Endocrine: Negative.   Musculoskeletal: Negative.   Skin: Negative.   Allergic/Immunologic: Negative.   Neurological: Negative.   Hematological: Negative.   Psychiatric/Behavioral: Negative.   All other systems reviewed and are negative.   BP 118/70  Pulse 63  Ht 5\' 9"  (1.753 m)  Wt 209 lb (94.802 kg)  BMI 30.85 kg/m2  Physical Exam  Nursing note and vitals reviewed. Constitutional: He is oriented to person, place, and time. He appears well-developed and well-nourished.  HENT:  Head: Normocephalic.  Nose: Nose normal.  Mouth/Throat: Oropharynx is clear and moist.  Eyes: Conjunctivae are normal. Pupils are equal, round, and reactive to light.  Neck: Normal range of motion. Neck supple. No JVD present.  Cardiovascular: Normal rate, regular rhythm, S1 normal, S2 normal, normal heart sounds and intact distal pulses.  Exam reveals no gallop and no friction rub.   No murmur heard. Pulmonary/Chest: Effort normal and breath sounds normal. No respiratory distress. He has no wheezes. He has no rales. He exhibits no tenderness.  Abdominal: Soft. Bowel sounds are normal.  He exhibits no distension. There is no tenderness.  Musculoskeletal: Normal range of motion. He exhibits no edema and no tenderness.  Lymphadenopathy:    He has no cervical adenopathy.  Neurological: He is alert and oriented to person, place, and time. Coordination normal.  Skin: Skin is warm and dry. No rash noted. No erythema.  Psychiatric: He has a normal mood and affect. His behavior is normal. Judgment and thought content normal.      Assessment and Plan        And

## 2013-08-09 NOTE — Assessment & Plan Note (Signed)
Atypical left flank pain. We have offered a stress test. He has declined at this time. He'll call his back if symptoms get worse, particularly with exertion

## 2013-08-09 NOTE — Assessment & Plan Note (Signed)
We attempted to obtain his prior lipid panel. Will try to call Alexander Cobb again as we were unsuccessful.

## 2013-08-10 ENCOUNTER — Encounter: Payer: Self-pay | Admitting: Cardiovascular Disease

## 2014-04-23 ENCOUNTER — Other Ambulatory Visit: Payer: Self-pay | Admitting: Cardiovascular Disease

## 2014-06-10 ENCOUNTER — Ambulatory Visit (INDEPENDENT_AMBULATORY_CARE_PROVIDER_SITE_OTHER): Payer: BLUE CROSS/BLUE SHIELD | Admitting: Cardiovascular Disease

## 2014-06-10 ENCOUNTER — Encounter: Payer: Self-pay | Admitting: Cardiovascular Disease

## 2014-06-10 VITALS — BP 140/82 | HR 66 | Ht 69.0 in | Wt 202.5 lb

## 2014-06-10 DIAGNOSIS — IMO0001 Reserved for inherently not codable concepts without codable children: Secondary | ICD-10-CM

## 2014-06-10 DIAGNOSIS — I10 Essential (primary) hypertension: Secondary | ICD-10-CM

## 2014-06-10 DIAGNOSIS — R0789 Other chest pain: Secondary | ICD-10-CM | POA: Diagnosis not present

## 2014-06-10 DIAGNOSIS — E1165 Type 2 diabetes mellitus with hyperglycemia: Secondary | ICD-10-CM

## 2014-06-10 DIAGNOSIS — I2510 Atherosclerotic heart disease of native coronary artery without angina pectoris: Secondary | ICD-10-CM | POA: Diagnosis not present

## 2014-06-10 DIAGNOSIS — E785 Hyperlipidemia, unspecified: Secondary | ICD-10-CM | POA: Diagnosis not present

## 2014-06-10 DIAGNOSIS — F17200 Nicotine dependence, unspecified, uncomplicated: Secondary | ICD-10-CM

## 2014-06-10 DIAGNOSIS — Z720 Tobacco use: Secondary | ICD-10-CM

## 2014-06-10 DIAGNOSIS — R079 Chest pain, unspecified: Secondary | ICD-10-CM

## 2014-06-10 MED ORDER — PRAVASTATIN SODIUM 40 MG PO TABS: 40.0000 mg | ORAL_TABLET | Freq: Every day | ORAL | Status: DC

## 2014-06-10 NOTE — Assessment & Plan Note (Signed)
Atypical chest pain. Recommended if symptoms become more frequent, get worse, especially with exertion that he call the office. We'll restart cholesterol medication Lab work ordered today

## 2014-06-10 NOTE — Assessment & Plan Note (Signed)
We have drawn a hemoglobin A1c today. We have not refilled his medications today, will wait until lab work has returned next week before refilling We have scheduled a appointment with endocrine

## 2014-06-10 NOTE — Assessment & Plan Note (Signed)
We have encouraged her to continue to work on weaning her cigarettes and smoking cessation. She will continue to work on this and does not want any assistance with chantix.  

## 2014-06-10 NOTE — Assessment & Plan Note (Signed)
We have recommended that he restart his pravastatin Lipids drawn today on no medications, fasting

## 2014-06-10 NOTE — Assessment & Plan Note (Signed)
Blood pressure high normal. We will not restart his medications at this time. Recommended he monitor his blood pressure at home Improved blood pressure likely from 20 pound weight loss

## 2014-06-10 NOTE — Assessment & Plan Note (Signed)
High risk of coronary artery disease. Atypical features on today's visit Recommended he call us if symptoms get worse, especially with exertion. Stress testing or catheterization would be ordered

## 2014-06-10 NOTE — Progress Notes (Signed)
Patient ID: Alexander Cobb, male    DOB: [birth year], 64 y.o.   MRN: [id number]  HPI Comments: Alexander Cobb is a very pleasant 64 year old gentleman with a history of smoking, who continues to smoke, poorly controlled diabetes, nonischemic cardiopathy, Non-STEMI, ejection fraction by catheterization estimated at 33% in May of 2012, mild proximal RCA disease, 75% PDA disease, who presents for routine followup of his coronary artery disease  He works with heavy machinery.  In follow-up today, he reports that he ran out of his medications and currently only taking aspirin He has lost 20 pounds from his prior clinic visit. Used to weight 220 pounds per the patient, now down to 202 Denies excessive urination, polydipsia. Not drinking sodas anymore, now drinking water Denies any shortness of breath with exertion. Rare episodes of atypical chest pain, occasionally left flank lasting for several minutes while at rest, rare episodes of discomfort in his chest central area, left mediastinum, again typically at rest. On previous visits also had rare atypical left flank pain Otherwise works hard at his job  EKG on today's visit shows normal sinus rhythm with rate 66 bpm, no significant ST or T-wave changes  Other past medical history Previous lab work showing diabetes poorly controlled. He reports that he no longer has followup with Dr. Dr. Gabriel Carina of endocrine. hemoglobin A1c previously more than 12. He continues to smoke one pack per day. Previous problems with sleep  Echocardiogram 2012 showed ejection fraction 40-45%. Discharge summary from May of 2012 mentions he had episodes of SVT though there is no significant detail.   Allergies  Allergen Reactions  . Sulfa Drugs Cross Reactors Hives    Hives     Outpatient Encounter Prescriptions as of 06/10/2014  Medication Sig  . amLODipine (NORVASC) 10 MG tablet Take 1 tablet (10 mg total) by mouth daily.  Marland Kitchen aspirin 81 MG tablet Take 81 mg by mouth daily.     . carvedilol (COREG) 6.25 MG tablet TAKE 1 TABLET TWICE A DAY WITH FOOD  . doxycycline (VIBRA-TABS) 100 MG tablet Take 100 mg by mouth daily.   Marland Kitchen glipiZIDE (GLUCOTROL XL) 2.5 MG 24 hr tablet Take 2.5 mg by mouth daily.    Marland Kitchen lisinopril (PRINIVIL,ZESTRIL) 40 MG tablet Take 40 mg by mouth daily.    . metFORMIN (GLUCOPHAGE) 500 MG tablet Take 500 mg by mouth daily with breakfast.   . nitroGLYCERIN (NITROSTAT) 0.4 MG SL tablet Place 1 tablet (0.4 mg total) under the tongue every 5 (five) minutes as needed for chest pain.  . pioglitazone (ACTOS) 15 MG tablet Take 15 mg by mouth daily.  . pravastatin (PRAVACHOL) 40 MG tablet Take 1 tablet (40 mg total) by mouth daily.  . [DISCONTINUED] pravastatin (PRAVACHOL) 40 MG tablet Take 1 tablet (40 mg total) by mouth daily.    Past Medical History  Diagnosis Date  . Hyperlipidemia   . Cardiomyopathy   . CAD (coronary artery disease) 12/21/2010  . HTN (hypertension) 12/21/2010  . Fluttering heart 12/21/2010    Past Surgical History  Procedure Laterality Date  . Cardiac catheterization      Social History  reports that he has been smoking Cigarettes.  He has a 40 pack-year smoking history. He does not have any smokeless tobacco history on file. He reports that he does not drink alcohol or use illicit drugs.  Family History family history includes COPD in his father.   Review of Systems  HENT: Negative.   Eyes: Negative.   Respiratory:  Negative.        Mild shortness of breath  Cardiovascular: Positive for chest pain.       Left flank pain  Gastrointestinal: Negative.   Musculoskeletal: Negative.   Skin: Negative.   Neurological: Negative.   Hematological: Negative.   Psychiatric/Behavioral: Negative.   All other systems reviewed and are negative.   BP 140/82 mmHg  Pulse 66  Ht 5\' 9"  (1.753 m)  Wt 202 lb 8 oz (91.853 kg)  BMI 29.89 kg/m2  Physical Exam  Constitutional: He is oriented to person, place, and time. He appears  well-developed and well-nourished.  HENT:  Head: Normocephalic.  Nose: Nose normal.  Mouth/Throat: Oropharynx is clear and moist.  Eyes: Conjunctivae are normal. Pupils are equal, round, and reactive to light.  Neck: Normal range of motion. Neck supple. No JVD present.  Cardiovascular: Normal rate, regular rhythm, S1 normal, S2 normal, normal heart sounds and intact distal pulses.  Exam reveals no gallop and no friction rub.   No murmur heard. Pulmonary/Chest: Effort normal and breath sounds normal. No respiratory distress. He has no wheezes. He has no rales. He exhibits no tenderness.  Abdominal: Soft. Bowel sounds are normal. He exhibits no distension. There is no tenderness.  Musculoskeletal: Normal range of motion. He exhibits no edema or tenderness.  Lymphadenopathy:    He has no cervical adenopathy.  Neurological: He is alert and oriented to person, place, and time. Coordination normal.  Skin: Skin is warm and dry. No rash noted. No erythema.  Psychiatric: He has a normal mood and affect. His behavior is normal. Judgment and thought content normal.      Assessment and Plan   Nursing note and vitals reviewed.      And

## 2014-06-10 NOTE — Patient Instructions (Signed)
Please restart pravastatin (cholesterol)  We will check HBA1C, lipids, BMP, lft  Please call us if you have new issues that need to be addressed before your next appt.  Your physician wants you to follow-up in: 6 months.  You will receive a reminder letter in the mail two months in advance. If you don't receive a letter, please call our office to schedule the follow-up appointment.

## 2014-06-11 LAB — BASIC METABOLIC PANEL
BUN/Creatinine Ratio: 13 (ref 10–22)
BUN: 14 mg/dL (ref 8–27)
CO2: 19 mmol/L (ref 18–29)
Calcium: 9 mg/dL (ref 8.6–10.2)
Chloride: 96 mmol/L — ABNORMAL LOW (ref 97–108)
Creatinine, Ser: 1.1 mg/dL (ref 0.76–1.27)
GFR calc Af Amer: 82 mL/min/{1.73_m2} (ref >59)
GFR calc non Af Amer: 71 mL/min/{1.73_m2} (ref >59)
Glucose: 330 mg/dL — ABNORMAL HIGH (ref 65–99)
Potassium: 4.7 mmol/L (ref 3.5–5.2)
Sodium: 133 mmol/L — ABNORMAL LOW (ref 134–144)

## 2014-06-11 LAB — HEMOGLOBIN A1C
Est. average glucose Bld gHb Est-mCnc: 321 mg/dL
Hgb A1c MFr Bld: 12.8 % — ABNORMAL HIGH (ref 4.8–5.6)

## 2014-06-11 LAB — LIPID PANEL
Chol/HDL Ratio: 4.2 ratio units (ref 0.0–5.0)
Cholesterol, Total: 177 mg/dL (ref 100–199)
HDL: 42 mg/dL (ref >39)
LDL Calculated: 118 mg/dL — ABNORMAL HIGH (ref 0–99)
Triglycerides: 86 mg/dL (ref 0–149)
VLDL Cholesterol Cal: 17 mg/dL (ref 5–40)

## 2014-06-11 LAB — HEPATIC FUNCTION PANEL
ALT: 10 IU/L (ref 0–44)
AST: 7 IU/L (ref 0–40)
Albumin: 4.4 g/dL (ref 3.6–4.8)
Alkaline Phosphatase: 58 IU/L (ref 39–117)
Bilirubin Total: 0.5 mg/dL (ref 0.0–1.2)
Bilirubin, Direct: 0.14 mg/dL (ref 0.00–0.40)
Total Protein: 6.6 g/dL (ref 6.0–8.5)

## 2014-07-19 ENCOUNTER — Ambulatory Visit (INDEPENDENT_AMBULATORY_CARE_PROVIDER_SITE_OTHER): Payer: BLUE CROSS/BLUE SHIELD | Admitting: Endocrinology

## 2014-07-19 ENCOUNTER — Encounter: Payer: Self-pay | Admitting: Endocrinology

## 2014-07-19 VITALS — BP 138/84 | HR 74 | Resp 14 | Ht 69.0 in | Wt 199.2 lb

## 2014-07-19 DIAGNOSIS — E785 Hyperlipidemia, unspecified: Secondary | ICD-10-CM

## 2014-07-19 DIAGNOSIS — I10 Essential (primary) hypertension: Secondary | ICD-10-CM

## 2014-07-19 DIAGNOSIS — E119 Type 2 diabetes mellitus without complications: Secondary | ICD-10-CM

## 2014-07-19 DIAGNOSIS — F17200 Nicotine dependence, unspecified, uncomplicated: Secondary | ICD-10-CM

## 2014-07-19 DIAGNOSIS — Z720 Tobacco use: Secondary | ICD-10-CM | POA: Diagnosis not present

## 2014-07-19 DIAGNOSIS — Z8639 Personal history of other endocrine, nutritional and metabolic disease: Secondary | ICD-10-CM

## 2014-07-19 MED ORDER — GLUCOSE BLOOD VI STRP: ORAL_STRIP | Status: DC

## 2014-07-19 MED ORDER — INSULIN PEN NEEDLE 32G X 4 MM MISC: Status: DC

## 2014-07-19 MED ORDER — INSULIN ASPART PROT & ASPART (70-30 MIX) 100 UNIT/ML PEN: 11.0000 [IU] | PEN_INJECTOR | Freq: Two times a day (BID) | SUBCUTANEOUS | Status: DC

## 2014-07-19 MED ORDER — ONETOUCH DELICA LANCETS 33G MISC: Status: DC

## 2014-07-19 NOTE — Progress Notes (Signed)
Reason for visit-  Alexander Cobb is a 64 y.o.-year-old male, referred by his cardiologist, Dr [name]. His PCP is  [name], MD. Referred for management of Type 2 diabetes, uncontrolled, without complications. Associated hx of CAD,CMP. Was seeing [name] for past year, last seen her over 1 year ago. Dismissed from their practice Has been following up with cards for past 2-3 years and recently was detected with high sugars and A1c and referred here.    HPI- Patient has been diagnosed with diabetes in ~2006. Recalls being initially on lifestyle modifications.  Tried  Metformin, Glipizide/Glimeperide, Actos, Victoza. he has not been on insulin before.    -Tried metformin, before, "my Rx expired 6 months ago" -Was also on actos 15 mg daily, script expired  -Victoza taken off by Dr [name]. ? Cause not known - Glimeperide expired script as well   Pt is currently on a regimen of: - n/a   Last hemoglobin A1c was: Lab Results  Component Value Date   HGBA1C 12.8* 06/10/2014     Pt checks his sugars 0 a day . Uses no glucometer. By recall/meter download/meter review they are:  PREMEAL Breakfast Lunch Dinner Bedtime Overall  Glucose range:     n/a  Mean/median:        POST-MEAL PC Breakfast PC Lunch PC Dinner  Glucose range:     Mean/median:       Hypoglycemia-  No lows. Lowest sugar was n/a; he has hypoglycemia awareness at 70.   Dietary habits- eats three times daily. 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 No known neuropathy.  Associated history - Has CAD and congested CMP . No prior stroke. No hypothyroidism. his last TSH was  Lab Results  Component Value Date   TSH 1.320 07/19/2014    Hyperactive thyroid, completed course for methimazole, was treated by [name] at [facility], and asked to come off treatment few years back?  Hyperlipidemia-  his last set of lipids were- Currently on pravachol 40. Tolerating well.   Lab Results  Component Value Date   CHOL 177 06/10/2014   HDL 42 06/10/2014   LDLCALC 118* 06/10/2014   TRIG 86 06/10/2014   CHOLHDL 4.2 06/10/2014    Blood Pressure/HTN- Patient's blood pressure is well controlled today on current regimen.  Pt has FH of DM in mother. Still smoking cigs daily Is a heavy machine operator  I have reviewed the patient's past medical history, family and social history, surgical history, medications and allergies.  Past Medical History  Diagnosis Date  . Hyperlipidemia   . Cardiomyopathy   . CAD (coronary artery disease) 12/21/2010  . HTN (hypertension) 12/21/2010  . Fluttering heart 12/21/2010  . Diabetes mellitus without complication    Past Surgical History  Procedure Laterality Date  . Cardiac catheterization     Family History  Problem Relation Age of Onset  . COPD Father  History   Social History  . Marital Status: Married    Spouse Name: N/A  . Number of Children: N/A  . Years of Education: N/A   Occupational History  . Not on file.   Social History Main Topics  . Smoking status: Current Every Day Smoker -- 1.00 packs/day for 40 years    Types: Cigarettes  . Smokeless tobacco: Not on file  . Alcohol Use: No  . Drug Use: No  . Sexual Activity: Not on file   Other Topics Concern  . Not on file   Social History Narrative   Current Outpatient Prescriptions on File Prior to Visit  Medication Sig Dispense Refill  .  amLODipine (NORVASC) 10 MG tablet Take 1 tablet (10 mg total) by mouth daily. 30 tablet 6  . aspirin 81 MG tablet Take 81 mg by mouth daily.      . carvedilol (COREG) 6.25 MG tablet TAKE 1 TABLET TWICE A DAY WITH FOOD 180 tablet 3  . nitroGLYCERIN (NITROSTAT) 0.4 MG SL tablet Place 1 tablet (0.4 mg total) under the tongue every 5 (five) minutes as needed for chest pain. 25 tablet 6  . pravastatin (PRAVACHOL) 40 MG tablet Take 1 tablet (40 mg total) by mouth daily. 90 tablet 3   No current facility-administered medications on file prior to visit.   Allergies  Allergen Reactions  . Sulfa Drugs Cross Reactors Hives    Hives      Review of Systems: [x]  complains of  [  ] denies General:   [ x ] Recent weight change [  ] Fatigue  [  ] Loss of appetite Eyes: [  ]  Vision Difficulty [  ]  Eye pain ENT: [  ]  Hearing difficulty [  ]  Difficulty Swallowing CVS: [  ] Chest pain [  ]  Palpitations/Irregular Heart beat [  ]  Shortness of breath lying flat [  ] Swelling of legs Resp: [ x ] Frequent Cough [  ] Shortness of Breath  [ x ]  Wheezing GI: [  ] Heartburn  [  ] Nausea or Vomiting  [  ] Diarrhea [  ] Constipation  [  ] Abdominal Pain GU: [  ]  Polyuria  [  ]  nocturia Bones/joints:  [  ]  Muscle aches  [  ] Joint Pain  [  ] Bone pain Skin/Hair/Nails: [  ]  Rash  [  ] New stretch marks [  ]  Itching [  ] Hair loss [  ]  Excessive hair growth Reproduction: [  ] Low sexual desire , [  ]  Women: Menstrual cycle problems [  ]  Women: Breast Discharge [  ] Men: Difficulty with erections [  ]  Men: Enlarged Breasts CNS: [  ] Frequent Headaches [  ] Blurry vision [  ] Tremors [  ] Seizures [  ] Loss of consciousness [  ] Localized weakness Endocrine: [  ]  Excess thirst [  ]  Feeling excessively hot [  ]  Feeling excessively cold Heme: [ x ]  Easy bruising [  ]  Enlarged glands or lumps in neck Allergy: [  ]  Food allergies [ x ] Environmental allergies  PE: BP 138/84 mmHg  Pulse 74  Resp 14   Ht 5\' 9"  (1.753 m)  Wt 199 lb 4 oz (90.379 kg)  BMI 29.41 kg/m2  SpO2 96% Wt Readings from Last 3 Encounters:  07/19/14 199 lb 4 oz (90.379 kg)  06/10/14 202 lb 8 oz (91.853 kg)  08/09/13 209 lb (94.802 kg)   GENERAL: No acute distress, well developed HEENT:  Eye exam shows normal external appearance. Oral exam shows normal mucosa .  NECK:   Neck exam shows no lymphadenopathy. No Carotids bruits. Thyroid is not enlarged and no nodules felt.  no acanthosis nigricans LUNGS:         Chest is symmetrical. Lungs are clear to auscultation.Marland Kitchen   HEART:         Heart sounds:  S1 and S2 are normal. No murmurs or clicks heard. ABDOMEN:  No Distention present. Liver and spleen are not palpable. No other mass or tenderness present.  EXTREMITIES:     There is no edema. 2+ DP pulses  NEUROLOGICAL:     Grossly intact.            MUSCULOSKELETAL:       There is no enlargement or gross deformity of the joints.  SKIN:       No rash  ASSESSMENT AND PLAN: Problem List Items Addressed This Visit      Cardiovascular and Mediastinum   HTN (hypertension)    BP at target on current therapy. Update urine MA at next visits.         Endocrine   Diabetes - Primary    Discussed with the patient regarding goal sugars and A1c, risks of long term complications of uncontrolled DM and acute hyperglycemia.  Given One touch meter and testing supplies.  Discussed lifestyle changes and low carb options.  Discussed physical activity. Discussed medication regimens with insulin including basal/bolus or premixed.  He has elected to try Novolog mix 70/30 insulin. Insulin teaching given.  Patient Instructions  Check sugars 2 x daily ( before breakfast and before supper).  Record them in a log book and bring that/meter to next appointment.  Occasionally also check at bedtime ( weekends).   Work on dietary changes.   Start Novolog mix insulin at 11 units with breakfast and 11 units with supper.  If sugars are still over  200 in 3 days, then could increase insulin dose by 2 units once every 3 days till sugars are below 200 mostly.   Thyroid labs today. Get prior thyroid workup from dr Sollum/   Make appt with eye doctor in the next month  Please come back for a follow-up appointment in 2 weeks          Relevant Medications   insulin aspart protamine - aspart (NOVOLOG MIX 70/30 FLEXPEN) (70-30) 100 UNIT/ML FlexPen     Other   Smoking    Smoking cessation urged.      Hyperlipidemia    On statin now. Recent levels show uncontrolled LDL.      History of hyperthyroidism    Obtain notes from [name] regarding prior workup.  Clinically euthyroid today without significant goiter.  Check thyroid levels today.        Relevant Orders   T3, free (Completed)   Thyroid Profile II (Completed)   T4, free (Completed)        - Return to clinic in 2 weeks with sugar log/meter.  Kandise [facility] 07/22/2014 9:51 AM

## 2014-07-19 NOTE — Patient Instructions (Addendum)
Check sugars 2 x daily ( before breakfast and before supper).  Record them in a log book and bring that/meter to next appointment.  Occasionally also check at bedtime ( weekends).   Work on dietary changes.   Start Novolog mix insulin at 11 units with breakfast and 11 units with supper.  If sugars are still over 200 in 3 days, then could increase insulin dose by 2 units once every 3 days till sugars are below 200 mostly.   Thyroid labs today. Get prior thyroid workup from dr Sollum/   Make appt with eye doctor in the next month  Please come back for a follow-up appointment in 2 weeks

## 2014-07-19 NOTE — Progress Notes (Signed)
Pre visit review using our clinic review tool, if applicable. No additional management support is needed unless otherwise documented below in the visit note. 

## 2014-07-20 LAB — THYROID PROFILE II
Free Thyroxine Index: 2.2 (ref 1.2–4.9)
T3 Uptake Ratio: 25 % (ref 24–39)
T3, Total: 133 ng/dL (ref 71–180)
T4, Total: 8.9 ug/dL (ref 4.5–12.0)
TSH: 1.32 u[IU]/mL (ref 0.450–4.500)

## 2014-07-20 LAB — T4, FREE: Free T4: 0.84 ng/dL (ref 0.60–1.60)

## 2014-07-20 LAB — T3, FREE: T3, Free: 3.3 pg/mL (ref 2.3–4.2)

## 2014-07-22 NOTE — Assessment & Plan Note (Signed)
Obtain notes from [name] regarding prior workup.  Clinically euthyroid today without significant goiter.  Check thyroid levels today.

## 2014-07-22 NOTE — Assessment & Plan Note (Signed)
BP at target on current therapy. Update urine MA at next visits.

## 2014-07-22 NOTE — Assessment & Plan Note (Signed)
On statin now. Recent levels show uncontrolled LDL.

## 2014-07-22 NOTE — Assessment & Plan Note (Signed)
Smoking cessation urged.

## 2014-07-22 NOTE — Assessment & Plan Note (Signed)
Discussed with the patient regarding goal sugars and A1c, risks of long term complications of uncontrolled DM and acute hyperglycemia.  Given One touch meter and testing supplies.  Discussed lifestyle changes and low carb options.  Discussed physical activity. Discussed medication regimens with insulin including basal/bolus or premixed.  He has elected to try Novolog mix 70/30 insulin. Insulin teaching given.  Patient Instructions  Check sugars 2 x daily ( before breakfast and before supper).  Record them in a log book and bring that/meter to next appointment.  Occasionally also check at bedtime ( weekends).   Work on dietary changes.   Start Novolog mix insulin at 11 units with breakfast and 11 units with supper.  If sugars are still over 200 in 3 days, then could increase insulin dose by 2 units once every 3 days till sugars are below 200 mostly.   Thyroid labs today. Get prior thyroid workup from dr Sollum/   Make appt with eye doctor in the next month  Please come back for a follow-up appointment in 2 weeks

## 2014-08-03 ENCOUNTER — Ambulatory Visit (INDEPENDENT_AMBULATORY_CARE_PROVIDER_SITE_OTHER): Payer: BLUE CROSS/BLUE SHIELD | Admitting: Endocrinology

## 2014-08-03 ENCOUNTER — Encounter: Payer: Self-pay | Admitting: Endocrinology

## 2014-08-03 ENCOUNTER — Telehealth: Payer: Self-pay

## 2014-08-03 VITALS — BP 138/72 | HR 72 | Resp 14 | Ht 69.0 in | Wt 198.0 lb

## 2014-08-03 DIAGNOSIS — Z8639 Personal history of other endocrine, nutritional and metabolic disease: Secondary | ICD-10-CM

## 2014-08-03 DIAGNOSIS — E785 Hyperlipidemia, unspecified: Secondary | ICD-10-CM | POA: Diagnosis not present

## 2014-08-03 DIAGNOSIS — I10 Essential (primary) hypertension: Secondary | ICD-10-CM | POA: Diagnosis not present

## 2014-08-03 DIAGNOSIS — E119 Type 2 diabetes mellitus without complications: Secondary | ICD-10-CM

## 2014-08-03 NOTE — Telephone Encounter (Signed)
The patient was curious to know if his strips for his Free Style Lite meter were called in. Pharmacy - [facility] on [address]

## 2014-08-03 NOTE — Progress Notes (Signed)
Reason for visit-  Alexander Cobb is a 64 y.o.-year-old male, for follow up management of Type 2 diabetes, uncontrolled, without complications. Associated hx of CAD,CMP. Was seeing [name] for past year, last seen her over 1 year ago. Dismissed from their practice Has been following up with cards for past 2-3 years and recently was detected with high sugars and A1c and referred here.    HPI- Patient has been diagnosed with diabetes in ~2006. Recalls being initially on lifestyle modifications.  Tried  Metformin, Glipizide/Glimeperide, Actos, Victoza. he has not been on insulin before.    -Tried metformin, before, "my Rx expired 6 months ago" -Was also on actos 15 mg daily, script expired  -Victoza taken off by Dr [name]. ? Cause not known - Glimeperide expired script as well   Pt is currently on a regimen of: - Novolog mix 70/30 at 13 units [place] BID ( when he went upto 15 units BID, felt symptoms of low)-start May 2016   Last hemoglobin A1c was: Lab Results  Component Value Date   HGBA1C 12.8* 06/10/2014     Pt checks his sugars 3 a day . Uses One Touch ( but not covered) glucometer. By recall/meter download/meter review they are:  PREMEAL Breakfast Lunch Dinner Bedtime Overall  Glucose range: 156-210  134-292 167-302   Mean/median:        POST-MEAL PC Breakfast PC Lunch PC Dinner  Glucose range:     Mean/median:       Hypoglycemia-  No lows. Lowest sugar was n/a; he has hypoglycemia awareness at 70.   Dietary habits- eats three times daily. 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 No known neuropathy.  Associated history - Has CAD and congested CMP . No prior stroke. No hypothyroidism. his last TSH was  Lab Results  Component Value Date   TSH 1.320 07/19/2014    Hyperactive thyroid, completed course for methimazole, was treated by [name] at [facility], and asked to come off treatment few years back? Recent levels normal.   Hyperlipidemia-  his last set of lipids were- Currently on pravachol 40. Tolerating well.   Lab Results  Component Value Date   CHOL 177 06/10/2014   HDL 42 06/10/2014   LDLCALC 118* 06/10/2014   TRIG 86 06/10/2014   CHOLHDL 4.2 06/10/2014    Blood Pressure/HTN- Patient's blood pressure is well controlled today on current regimen.  Pt has FH of DM in mother. 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   Current Outpatient Prescriptions on File Prior to Visit  Medication Sig Dispense Refill  . amLODipine (NORVASC) 10 MG tablet Take 1 tablet (10 mg total) by mouth daily. 30 tablet 6  . aspirin 81 MG tablet Take 81 mg by mouth daily.      . carvedilol (COREG) 6.25 MG tablet  TAKE 1 TABLET TWICE A DAY WITH FOOD 180 tablet 3  . glucose blood (ONETOUCH VERIO) test strip Test sugars three times daily 100 each 12  . insulin aspart protamine - aspart (NOVOLOG MIX 70/30 FLEXPEN) (70-30) 100 UNIT/ML FlexPen Inject 0.11 mLs (11 Units total) into the skin 2 (two) times daily. 15 mL 3  . Insulin Pen Needle (BD PEN NEEDLE NANO U/F) 32G X 4 MM MISC Use for insulin injection twice daily 60 each 11  . nitroGLYCERIN (NITROSTAT) 0.4 MG SL tablet Place 1 tablet (0.4 mg total) under the tongue every 5 (five) minutes as needed for chest pain. 25 tablet 6  . ONETOUCH DELICA LANCETS 16X MISC Test  sugars three times daily 100 each 11  . pravastatin (PRAVACHOL) 40 MG tablet Take 1 tablet (40 mg total) by mouth daily. 90 tablet 3   No current facility-administered medications on file prior to visit.   Allergies  Allergen Reactions  . Sulfa Drugs Cross Reactors Hives    Hives      Review of Systems- [ x ]  Complains of    [  ]  denies [  ] Recent weight change [  ]  Fatigue [  ] polydipsia [  ] polyuria [  ]  nocturia [  ]  vision difficulty [  ] chest pain [  ] shortness of breath [  ] leg swelling [  ] cough [  ] nausea/vomiting [  ] diarrhea [  ] constipation [  ] abdominal pain [  ]  tingling/numbness in extremities [  ]  concern with feet ( wounds/sores)   PE: BP 138/72 mmHg  Pulse 72  Resp 14  Ht 5\' 9"  (1.753 m)  Wt 198 lb (89.812 kg)  BMI 29.23 kg/m2 Wt Readings from Last 3 Encounters:  08/03/14 198 lb (89.812 kg)  07/19/14 199 lb 4 oz (90.379 kg)  06/10/14 202 lb 8 oz (91.853 kg)   Exam: deferred  ASSESSMENT AND PLAN: Problem List Items Addressed This Visit      Cardiovascular and Mediastinum   HTN (hypertension)    BP at target on current therapy. Update urine MA at next visits.           Endocrine   Diabetes - Primary    Sugars are getting better and symptoms of low could be associated with him having more normal range numbers. Asked him to check sugars when symptoms and not treat reflexively.    Patient Instructions  Check sugars 2 x daily ( before breakfast and before supper).  Record them in a log book and bring that/meter to next appointment.   Change to Freestyle meter and testing supplies If this is not covered- then let us know which one is so that a new script can be sent accordingly.   Stay hydrated and make good choices with your food.  Continue Novolog mix 70/30 at 13 units twice daily. If sugars are still going up above 180 range, then increase insulin to 14 units twice daily.  Please come back for a follow-up appointment in 1  month.              Other   Hyperlipidemia    On statin now. Recent levels show uncontrolled LDL.        History of hyperthyroidism    Obtain notes from [name] regarding prior workup.  Clinically euthyroid today without significant goiter.  Recent thyroid levels normal.               -  Return to clinic in 4 weeks with sugar log/meter.  Patt Steinhardt [facility] 08/07/2014 2:26 PM

## 2014-08-03 NOTE — Progress Notes (Signed)
Pre visit review using our clinic review tool, if applicable. No additional management support is needed unless otherwise documented below in the visit note. 

## 2014-08-03 NOTE — Telephone Encounter (Signed)
Spoke with patient and he is going to check with his insurance to see if this brand is covered prior to sending in to the pharmacy.  Pt will call back and let us know.

## 2014-08-03 NOTE — Patient Instructions (Signed)
Check sugars 2 x daily ( before breakfast and before supper).  Record them in a log book and bring that/meter to next appointment.   Change to Freestyle meter and testing supplies If this is not covered- then let us know which one is so that a new script can be sent accordingly.   Stay hydrated and make good choices with your food.  Continue Novolog mix 70/30 at 13 units twice daily. If sugars are still going up above 180 range, then increase insulin to 14 units twice daily.  Please come back for a follow-up appointment in 1 month.

## 2014-08-07 NOTE — Assessment & Plan Note (Signed)
BP at target on current therapy. Update urine MA at next visits.

## 2014-08-07 NOTE — Assessment & Plan Note (Signed)
Sugars are getting better and symptoms of low could be associated with him having more normal range numbers. Asked him to check sugars when symptoms and not treat reflexively.    Patient Instructions  Check sugars 2 x daily ( before breakfast and before supper).  Record them in a log book and bring that/meter to next appointment.   Change to Freestyle meter and testing supplies If this is not covered- then let us know which one is so that a new script can be sent accordingly.   Stay hydrated and make good choices with your food.  Continue Novolog mix 70/30 at 13 units twice daily. If sugars are still going up above 180 range, then increase insulin to 14 units twice daily.  Please come back for a follow-up appointment in 1 month.

## 2014-08-07 NOTE — Assessment & Plan Note (Signed)
On statin now. Recent levels show uncontrolled LDL.

## 2014-08-07 NOTE — Assessment & Plan Note (Signed)
Obtain notes from [name] regarding prior workup.  Clinically euthyroid today without significant goiter.  Recent thyroid levels normal.

## 2014-09-07 ENCOUNTER — Ambulatory Visit: Payer: BLUE CROSS/BLUE SHIELD | Admitting: Endocrinology

## 2014-12-07 ENCOUNTER — Ambulatory Visit (INDEPENDENT_AMBULATORY_CARE_PROVIDER_SITE_OTHER): Payer: BLUE CROSS/BLUE SHIELD | Admitting: Cardiovascular Disease

## 2014-12-07 ENCOUNTER — Encounter: Payer: Self-pay | Admitting: Cardiovascular Disease

## 2014-12-07 ENCOUNTER — Ambulatory Visit: Payer: BLUE CROSS/BLUE SHIELD | Admitting: Cardiovascular Disease

## 2014-12-07 VITALS — BP 140/70 | HR 70 | Ht 69.0 in | Wt 202.0 lb

## 2014-12-07 DIAGNOSIS — R002 Palpitations: Secondary | ICD-10-CM

## 2014-12-07 DIAGNOSIS — I4901 Ventricular fibrillation: Secondary | ICD-10-CM

## 2014-12-07 DIAGNOSIS — Z720 Tobacco use: Secondary | ICD-10-CM

## 2014-12-07 DIAGNOSIS — R079 Chest pain, unspecified: Secondary | ICD-10-CM

## 2014-12-07 DIAGNOSIS — E785 Hyperlipidemia, unspecified: Secondary | ICD-10-CM

## 2014-12-07 DIAGNOSIS — F17200 Nicotine dependence, unspecified, uncomplicated: Secondary | ICD-10-CM

## 2014-12-07 DIAGNOSIS — I498 Other specified cardiac arrhythmias: Secondary | ICD-10-CM

## 2014-12-07 DIAGNOSIS — E119 Type 2 diabetes mellitus without complications: Secondary | ICD-10-CM

## 2014-12-07 DIAGNOSIS — R42 Dizziness and giddiness: Secondary | ICD-10-CM | POA: Diagnosis not present

## 2014-12-07 DIAGNOSIS — I10 Essential (primary) hypertension: Secondary | ICD-10-CM

## 2014-12-07 DIAGNOSIS — I2510 Atherosclerotic heart disease of native coronary artery without angina pectoris: Secondary | ICD-10-CM

## 2014-12-07 NOTE — Patient Instructions (Addendum)
You are doing well. No medication changes were made.  We have ordered a 30 day monitor for tachycardia, palpitations, and lightheadedness  Please call us if you have new issues that need to be addressed before your next appt.  Your physician wants you to follow-up in: 6 months.  You will receive a reminder letter in the mail two months in advance. If you don't receive a letter, please call our office to schedule the follow-up appointment.  Cardiac Event Monitoring A cardiac event monitor is a small recording device used to help detect abnormal heart rhythms (arrhythmias). The monitor is used to record heart rhythm when noticeable symptoms such as the following occur:  Fast heartbeats (palpitations), such as heart racing or fluttering.  Dizziness.  Fainting or light-headedness.  Unexplained weakness. The monitor is wired to two electrodes placed on your chest. Electrodes are flat, sticky disks that attach to your skin. The monitor can be worn for up to 30 days. You will wear the monitor at all times, except when bathing.  HOW TO USE YOUR CARDIAC EVENT MONITOR A technician will prepare your chest for the electrode placement. The technician will show you how to place the electrodes, how to work the monitor, and how to replace the batteries. Take time to practice using the monitor before you leave the office. Make sure you understand how to send the information from the monitor to your health care provider. This requires a telephone with a landline, not a cell phone. You need to:  Wear your monitor at all times, except when you are in water:  Do not get the monitor wet.  Take the monitor off when bathing. Do not swim or use a hot tub with it on.  Keep your skin clean. Do not put body lotion or moisturizer on your chest.  Change the electrodes daily or any time they stop sticking to your skin. You might need to use tape to keep them on.  It is possible that your skin under the electrodes  could become irritated. To keep this from happening, try to put the electrodes in slightly different places on your chest. However, they must remain in the area under your left breast and in the upper right section of your chest.  Make sure the monitor is safely clipped to your clothing or in a location close to your body that your health care provider recommends.  Press the button to record when you feel symptoms of heart trouble, such as dizziness, weakness, light-headedness, palpitations, thumping, shortness of breath, unexplained weakness, or a fluttering or racing heart. The monitor is always on and records what happened slightly before you pressed the button, so do not worry about being too late to get good information.  Keep a diary of your activities, such as walking, doing chores, and taking medicine. It is especially important to note what you were doing when you pushed the button to record your symptoms. This will help your health care provider determine what might be contributing to your symptoms. The information stored in your monitor will be reviewed by your health care provider alongside your diary entries.  Send the recorded information as recommended by your health care provider. It is important to understand that it will take some time for your health care provider to process the results.  Change the batteries as recommended by your health care provider. SEEK IMMEDIATE MEDICAL CARE IF:   You have chest pain.  You have extreme difficulty breathing or shortness of  breath.  You develop a very fast heartbeat that persists.  You develop dizziness that does not go away.  You faint or constantly feel you are about to faint. Document Released: 12/05/2007 Document Revised: 07/12/2013 Document Reviewed: 08/24/2012 [facility] Patient Information 2015 [place], [place]. This information is not intended to replace advice given to you by your health care provider. Make sure you discuss any  questions you have with your health care provider.

## 2014-12-07 NOTE — Assessment & Plan Note (Signed)
We have encouraged continued exercise, careful diet management   

## 2014-12-07 NOTE — Progress Notes (Signed)
Patient ID: Alexander Cobb, male    DOB: August 14, 1950, 64 y.o.   MRN: [id number]  HPI Comments: Alexander Cobb is a very pleasant 64 year old gentleman with a history of smoking, who continues to smoke, poorly controlled diabetes, nonischemic cardiopathy, Non-STEMI, ejection fraction by catheterization estimated at 33% in May of 2012, mild proximal RCA disease, 75% PDA disease, who presents for routine followup of his coronary artery disease  He works with heavy machinery.  In follow-up today, he reports having episode of chest pain in August. Had discomfort down his left arm, into his fingers. Reported that his flank underneath his arm in his armpit was hurting. Denied any heavy lifting. Unclear. It was reproducible or hurt with palpation. He took nitroglycerin 3 with improvement of his symptoms. He have recurrent symptoms the next day and took nitroglycerin 1 Since then has had no further symptoms over the past month  Currently does not take carvedilol. Reports this was held on his last clinic visit.   For the month of September he has had increasing lightheadedness associated with chest fluttering/arrhythmia. The symptoms make him feel weak,. Symptoms can go up to 5-10 minutes, on average 2-3 minutes at a time. Symptoms typically bad the past 2 days. He is requesting a event monitor to track his rhythm  EKG on today's visit shows normal sinus rhythm with rate 71 bpm, no significant ST or T-wave changes  Other past medical history Previous lab work showing diabetes poorly controlled. He reports that he no longer has followup with Dr. Dr. Gabriel Carina of endocrine. hemoglobin A1c previously more than 12. He continues to smoke one pack per day. Previous problems with sleep  Echocardiogram 2012 showed ejection fraction 40-45%. Discharge summary from May of 2012 mentions he had episodes of SVT though there is no significant detail.   Allergies  Allergen Reactions  . Sulfa Drugs Cross Reactors Hives     Hives     Outpatient Encounter Prescriptions as of 12/07/2014  Medication Sig  . amLODipine (NORVASC) 10 MG tablet Take 1 tablet (10 mg total) by mouth daily.  Marland Kitchen aspirin 81 MG tablet Take 81 mg by mouth daily.    . budesonide-formoterol (SYMBICORT) 160-4.5 MCG/ACT inhaler Inhale 2 puffs into the lungs 2 (two) times daily.  Marland Kitchen FARXIGA 10 MG TABS tablet Take 10 mg by mouth daily.  Marland Kitchen glucose blood (ONETOUCH VERIO) test strip Test sugars three times daily  . LANTUS SOLOSTAR 100 UNIT/ML Solostar Pen INJECT 20 UNITS SUBCUTANEOUSLY EVERY DAY  . lisinopril (PRINIVIL,ZESTRIL) 10 MG tablet Take 10 mg by mouth daily.  . nitroGLYCERIN (NITROSTAT) 0.4 MG SL tablet Place 1 tablet (0.4 mg total) under the tongue every 5 (five) minutes as needed for chest pain.  Glory Rosebush DELICA LANCETS 83M MISC Test sugars three times daily  . pravastatin (PRAVACHOL) 40 MG tablet Take 1 tablet (40 mg total) by mouth daily.  Marland Kitchen tiotropium (SPIRIVA) 18 MCG inhalation capsule Place 18 mcg into inhaler and inhale daily.  . [DISCONTINUED] carvedilol (COREG) 6.25 MG tablet TAKE 1 TABLET TWICE A DAY WITH FOOD (Patient not taking: Reported on 12/07/2014)  . [DISCONTINUED] insulin aspart protamine - aspart (NOVOLOG MIX 70/30 FLEXPEN) (70-30) 100 UNIT/ML FlexPen Inject 0.11 mLs (11 Units total) into the skin 2 (two) times daily. (Patient not taking: Reported on 12/07/2014)  . [DISCONTINUED] Insulin Pen Needle (BD PEN NEEDLE NANO U/F) 32G X 4 MM MISC Use for insulin injection twice daily (Patient not taking: Reported on 12/07/2014)   No  facility-administered encounter medications on file as of 12/07/2014.    Past Medical History  Diagnosis Date  . Hyperlipidemia   . Cardiomyopathy   . CAD (coronary artery disease) 12/21/2010  . HTN (hypertension) 12/21/2010  . Fluttering heart 12/21/2010  . Diabetes mellitus without complication   . COPD (chronic obstructive pulmonary disease)     Past Surgical History  Procedure Laterality  Date  . Cardiac catheterization      Social History  reports that he has been smoking Cigarettes.  He has a 40 pack-year smoking history. He does not have any smokeless tobacco history on file. He reports that he does not drink alcohol or use illicit drugs.  Family History family history includes COPD in his father.   Review of Systems  HENT: Negative.   Eyes: Negative.   Respiratory: Negative.        Mild shortness of breath  Cardiovascular: Positive for chest pain and palpitations.       Left flank pain  tachycardia  Gastrointestinal: Negative.   Musculoskeletal: Negative.   Skin: Negative.   Neurological: Negative.   Hematological: Negative.   Psychiatric/Behavioral: Negative.   All other systems reviewed and are negative.   BP 140/70 mmHg  Pulse 70  Ht 5\' 9"  (1.753 m)  Wt 202 lb (91.627 kg)  BMI 29.82 kg/m2  Physical Exam  Constitutional: He is oriented to person, place, and time. He appears well-developed and well-nourished.  HENT:  Head: Normocephalic.  Nose: Nose normal.  Mouth/Throat: Oropharynx is clear and moist.  Eyes: Conjunctivae are normal. Pupils are equal, round, and reactive to light.  Neck: Normal range of motion. Neck supple. No JVD present.  Cardiovascular: Normal rate, regular rhythm, S1 normal, S2 normal, normal heart sounds and intact distal pulses.  Exam reveals no gallop and no friction rub.   No murmur heard. Pulmonary/Chest: Effort normal and breath sounds normal. No respiratory distress. He has no wheezes. He has no rales. He exhibits no tenderness.  Abdominal: Soft. Bowel sounds are normal. He exhibits no distension. There is no tenderness.  Musculoskeletal: Normal range of motion. He exhibits no edema or tenderness.  Lymphadenopathy:    He has no cervical adenopathy.  Neurological: He is alert and oriented to person, place, and time. Coordination normal.  Skin: Skin is warm and dry. No rash noted. No erythema.  Psychiatric: He has a  normal mood and affect. His behavior is normal. Judgment and thought content normal.      Assessment and Plan   Nursing note and vitals reviewed.      And

## 2014-12-07 NOTE — Assessment & Plan Note (Signed)
Blood pressure is well controlled on today's visit. No changes made to the medications. 

## 2014-12-07 NOTE — Assessment & Plan Note (Addendum)
Worsening palpitations and tachycardia recently. Had similar symptoms on a last clinic visit April 2016. For symptomatic now. We'll order a 30 day monitor.

## 2014-12-07 NOTE — Assessment & Plan Note (Signed)
Stay on  Pravastatin, goal LD <70

## 2014-12-07 NOTE — Assessment & Plan Note (Signed)
We have encouraged him to continue to work on weaning his cigarettes and smoking cessation. He will continue to work on this and does not want any assistance with chantix.  

## 2014-12-07 NOTE — Assessment & Plan Note (Signed)
High risk of coronary artery disease. Atypical features on today's visit Recommended he call us if symptoms get worse, especially with exertion. Stress testing or catheterization would be ordered 

## 2014-12-07 NOTE — Assessment & Plan Note (Signed)
Chest pain symptoms last month, somewhat atypical in nature, presenting at rest. No further episodes since that time. He has had similar left flank pain before. Prior cardiac catheterization results discussed with him. As he is asymptomatic, he does not want stress testing at this time. This was offered. He will call us if he has recurrent symptoms

## 2014-12-09 ENCOUNTER — Encounter (INDEPENDENT_AMBULATORY_CARE_PROVIDER_SITE_OTHER): Payer: BLUE CROSS/BLUE SHIELD

## 2014-12-09 DIAGNOSIS — I4901 Ventricular fibrillation: Secondary | ICD-10-CM | POA: Diagnosis not present

## 2014-12-09 DIAGNOSIS — R42 Dizziness and giddiness: Secondary | ICD-10-CM

## 2014-12-09 DIAGNOSIS — I498 Other specified cardiac arrhythmias: Secondary | ICD-10-CM

## 2014-12-09 DIAGNOSIS — R079 Chest pain, unspecified: Secondary | ICD-10-CM | POA: Diagnosis not present

## 2014-12-09 DIAGNOSIS — R002 Palpitations: Secondary | ICD-10-CM | POA: Diagnosis not present

## 2015-06-28 ENCOUNTER — Telehealth: Payer: Self-pay | Admitting: Cardiovascular Disease

## 2015-06-28 NOTE — Telephone Encounter (Signed)
3 attempts to schedule fu from recall. lmov to schedule appt.  Deleting recall.

## 2015-06-30 ENCOUNTER — Ambulatory Visit (INDEPENDENT_AMBULATORY_CARE_PROVIDER_SITE_OTHER): Payer: BLUE CROSS/BLUE SHIELD | Admitting: Cardiovascular Disease

## 2015-06-30 ENCOUNTER — Encounter: Payer: Self-pay | Admitting: Cardiovascular Disease

## 2015-06-30 VITALS — BP 160/94 | HR 72 | Ht 69.0 in | Wt 192.5 lb

## 2015-06-30 DIAGNOSIS — I420 Dilated cardiomyopathy: Secondary | ICD-10-CM | POA: Diagnosis not present

## 2015-06-30 DIAGNOSIS — IMO0001 Reserved for inherently not codable concepts without codable children: Secondary | ICD-10-CM

## 2015-06-30 DIAGNOSIS — I498 Other specified cardiac arrhythmias: Secondary | ICD-10-CM

## 2015-06-30 DIAGNOSIS — F17200 Nicotine dependence, unspecified, uncomplicated: Secondary | ICD-10-CM

## 2015-06-30 DIAGNOSIS — I2510 Atherosclerotic heart disease of native coronary artery without angina pectoris: Secondary | ICD-10-CM

## 2015-06-30 DIAGNOSIS — I10 Essential (primary) hypertension: Secondary | ICD-10-CM | POA: Diagnosis not present

## 2015-06-30 DIAGNOSIS — Z720 Tobacco use: Secondary | ICD-10-CM

## 2015-06-30 DIAGNOSIS — E785 Hyperlipidemia, unspecified: Secondary | ICD-10-CM

## 2015-06-30 MED ORDER — VARENICLINE TARTRATE 1 MG PO TABS: 1.0000 mg | ORAL_TABLET | Freq: Two times a day (BID) | ORAL | Status: DC

## 2015-06-30 MED ORDER — AMLODIPINE BESYLATE 10 MG PO TABS: 10.0000 mg | ORAL_TABLET | Freq: Every day | ORAL | Status: DC

## 2015-06-30 MED ORDER — PRAVASTATIN SODIUM 40 MG PO TABS: 40.0000 mg | ORAL_TABLET | Freq: Every day | ORAL | Status: DC

## 2015-06-30 NOTE — Assessment & Plan Note (Signed)
Long discussion concerning his smoking and the need to quit. Prescription provided for Chantix with coupon Instructions on how to take the medication provided

## 2015-06-30 NOTE — Assessment & Plan Note (Signed)
Currently with no symptoms of angina. No further workup at this time.  Restart aspirin and statin Smoking cessation

## 2015-06-30 NOTE — Assessment & Plan Note (Signed)
Recommended he restart his pravastatin, goal LDL less than 70

## 2015-06-30 NOTE — Assessment & Plan Note (Signed)
Prior 30 day monitor showing run of SVT Denies having significant symptoms on today's visit If he has recurrent symptoms, could add beta blocker   Total encounter time more than 25 minutes  Greater than 50% was spent in counseling and coordination of care with the patient

## 2015-06-30 NOTE — Assessment & Plan Note (Signed)
Appears relatively euvolemic on today's visit History of medication noncompliance. We'll continue on lisinopril consider adding low-dose beta blocker in follow-up if he will be compliant

## 2015-06-30 NOTE — Patient Instructions (Addendum)
You are doing well.  Start chantix 1/2 pill twice a day Slowly increase up to a full pill after 1 to 2 weeks Stay on chantix for 3 months even if you quit after one month  Please take  Amlodipine/norvasc for blood pressure Please take pravastatin for cholesterol/slow down bloackages  Please call us if you have new issues that need to be addressed before your next appt.  Your physician wants you to follow-up in: 6 months.  You will receive a reminder letter in the mail two months in advance. If you don't receive a letter, please call our office to schedule the follow-up appointment.

## 2015-06-30 NOTE — Progress Notes (Signed)
Patient ID: Alexander Cobb, male    DOB: [birth year], 65 y.o.   MRN: [id number]  HPI Comments: Alexander Cobb is a very pleasant 65 year old gentleman with a history of smoking, who continues to smoke, poorly controlled diabetes, nonischemic cardiopathy, Non-STEMI, ejection fraction by catheterization estimated at 33% in May of 2012, mild proximal RCA disease, 75% PDA disease, who presents for routine followup of his coronary artery disease  He works with heavy machinery. Previous event monitor showing SVT   in follow-up today, he reports that he is not taking any of his medications Reports that he was previously seen by Dr. 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  On his last clinic visit he had episode of chest pain. On today's visit reports no recurrent episodes. Sometimes feels tired No recent lab work available through primary care  Previous event monitor from November 2016 reviewed with him showing one run of SVT otherwise normal sinus rhythm  EKG on today's visit shows normal sinus rhythm with rate 72 bpm, nonspecific ST abnormality anterolateral leads  Other past medical history chest pain in August 2016. Had discomfort down his left arm, into his fingers. Reported that his flank underneath his arm in his armpit was hurting.  It was reproducible or hurt with palpation. He took nitroglycerin 3 with improvement of his symptoms. He have recurrent symptoms the next day and took nitroglycerin 1 Since then has had no further symptoms   September 2016 he has had increasing lightheadedness associated with chest fluttering/arrhythmia.   The symptoms make him feel weak,. Symptoms can go up to 5-10 minutes, on average 2-3 minutes at a time.   Previous lab work showing  diabetes poorly controlled. He reports that he no longer has followup with Dr. Dr. Gabriel Carina of endocrine. hemoglobin A1c previously more than 12. He continues to smoke one pack per day. Previous problems with sleep  Echocardiogram 2012 showed ejection fraction 40-45%. Discharge summary from May of 2012 mentions he had episodes of SVT though there is no significant detail.   Allergies  Allergen Reactions  . Sulfa Drugs Cross Reactors Hives    Hives     Outpatient Encounter Prescriptions as of 06/30/2015  Medication Sig  . amLODipine (NORVASC) 10 MG tablet Take 1 tablet (10 mg total) by mouth daily.  Marland Kitchen aspirin 81 MG tablet Take 81 mg by mouth daily. Reported on 06/30/2015  . glucose blood (ONETOUCH VERIO) test strip Test sugars three times daily (Patient not taking: Reported on 06/30/2015)  . LANTUS SOLOSTAR 100 UNIT/ML Solostar Pen Reported on 06/30/2015  . nitroGLYCERIN (NITROSTAT) 0.4 MG SL tablet Place 1 tablet (0.4 mg total) under the tongue every 5 (five) minutes as needed for chest pain. (Patient not taking: Reported on 06/30/2015)  . ONETOUCH DELICA LANCETS 99991111 MISC Test sugars three times daily (Patient not taking: Reported on 06/30/2015)  . pravastatin (PRAVACHOL) 40 MG tablet Take 1 tablet (40 mg total) by mouth daily.  . varenicline (CHANTIX CONTINUING MONTH PAK) 1 MG tablet Take 1 tablet (1 mg total) by mouth 2 (two) times daily.  . [DISCONTINUED] amLODipine (NORVASC) 10 MG tablet Take 1 tablet (10 mg total) by mouth daily. (Patient not taking: Reported on 06/30/2015)  . [DISCONTINUED] budesonide-formoterol (SYMBICORT) 160-4.5 MCG/ACT inhaler Inhale 2 puffs into  the lungs 2 (two) times daily. Reported on 06/30/2015  . [DISCONTINUED] FARXIGA 10 MG TABS tablet Take 10 mg by mouth daily. Reported on 06/30/2015  . [DISCONTINUED] lisinopril (PRINIVIL,ZESTRIL) 10 MG tablet Take 10 mg by mouth daily. Reported on 06/30/2015  . [DISCONTINUED] pravastatin (PRAVACHOL) 40 MG tablet Take 1 tablet (40  mg total) by mouth daily. (Patient not taking: Reported on 06/30/2015)  . [DISCONTINUED] tiotropium (SPIRIVA) 18 MCG inhalation capsule Place 18 mcg into inhaler and inhale daily. Reported on 06/30/2015   No facility-administered encounter medications on file as of 06/30/2015.    Past Medical History  Diagnosis Date  . Hyperlipidemia   . Cardiomyopathy   . CAD (coronary artery disease) 12/21/2010  . HTN (hypertension) 12/21/2010  . Fluttering heart ([place]) 12/21/2010  . Diabetes mellitus without complication ([place])   . COPD (chronic obstructive pulmonary disease) [facility])     Past Surgical History  Procedure Laterality Date  . Cardiac catheterization      Social History  reports that he has been smoking Cigarettes.  He has a 40 pack-year smoking history. He does not have any smokeless tobacco history on file. He reports that he does not drink alcohol or use illicit drugs.  Family History family history includes COPD in his father.   Review of Systems  HENT: Negative.   Eyes: Negative.   Respiratory: Negative.        Mild shortness of breath  Cardiovascular: Negative.   Gastrointestinal: Negative.   Musculoskeletal: Negative.   Skin: Negative.   Neurological: Negative.   Hematological: Negative.   Psychiatric/Behavioral: Negative.   All other systems reviewed and are negative.   BP 160/94 mmHg  Pulse 72  Ht 5\' 9"  (1.753 m)  Wt 192 lb 8 oz (87.317 kg)  BMI 28.41 kg/m2  Physical Exam  Constitutional: He is oriented to person, place, and time. He appears well-developed and well-nourished.  HENT:  Head: Normocephalic.  Nose: Nose normal.  Mouth/Throat: Oropharynx is clear and moist.  Eyes: Conjunctivae are normal. Pupils are equal, round, and reactive to light.  Neck: Normal range of motion. Neck supple. No JVD present.  Cardiovascular: Normal rate, regular rhythm, S1 normal, S2 normal, normal heart sounds and intact distal pulses.  Exam reveals no gallop and no friction  rub.   No murmur heard. Pulmonary/Chest: Effort normal and breath sounds normal. No respiratory distress. He has no wheezes. He has no rales. He exhibits no tenderness.  Abdominal: Soft. Bowel sounds are normal. He exhibits no distension. There is no tenderness.  Musculoskeletal: Normal range of motion. He exhibits no edema or tenderness.  Lymphadenopathy:    He has no cervical adenopathy.  Neurological: He is alert and oriented to person, place, and time. Coordination normal.  Skin: Skin is warm and dry. No rash noted. No erythema.  Psychiatric: He has a normal mood and affect. His behavior is normal. Judgment and thought content normal.      Assessment and Plan   Nursing note and vitals reviewed.      And

## 2015-06-30 NOTE — Assessment & Plan Note (Signed)
Blood pressure rechecked today him a 0000000 systolic. Long discussion with him concerning his medications. Recommended he start amlodipine 10 mg daily as he was taking previously We'll hold off on beta blocker at this time given his medication noncompliance history

## 2015-08-23 ENCOUNTER — Telehealth: Payer: Self-pay | Admitting: Cardiovascular Disease

## 2015-08-23 NOTE — Telephone Encounter (Signed)
Received cardiac clearance request for pt to proceed w/ elective colonoscopy on 11/07/15 w/ Dr. Gustavo Lah. They request clearance only w/ no meds being addressed. Please fax back to [facility] @ [phone].

## 2015-08-25 NOTE — Telephone Encounter (Signed)
I would call the patient to confirm he is not had any recent episodes of angina/chest pain before providing clearance

## 2015-08-28 NOTE — Telephone Encounter (Signed)
Left message for pt to call back  °

## 2015-08-30 NOTE — Telephone Encounter (Signed)
Pt is returning call from Monday. Best time to call is after 5 pm. Pt states "everything is normal on my heart"

## 2015-09-01 NOTE — Telephone Encounter (Signed)
Okay to have colonoscopy, no further testing needed, acceptable risk

## 2015-09-01 NOTE — Telephone Encounter (Signed)
Routed to [facility] @ [phone].

## 2015-11-06 ENCOUNTER — Encounter: Payer: Self-pay | Admitting: *Deleted

## 2015-11-07 ENCOUNTER — Ambulatory Visit: Payer: BLUE CROSS/BLUE SHIELD | Admitting: Anesthesiology

## 2015-11-07 ENCOUNTER — Encounter: Payer: Self-pay | Admitting: *Deleted

## 2015-11-07 ENCOUNTER — Encounter
Admission: RE | Disposition: A | Discharge status: Home / Self Care | Payer: Self-pay | Source: Ambulatory Visit | Attending: Gastroenterology

## 2015-11-07 ENCOUNTER — Ambulatory Visit
Admission: RE | Admit: 2015-11-07 | Discharge: 2015-11-07 | Disposition: A | Discharge status: Home / Self Care | Payer: BLUE CROSS/BLUE SHIELD | Source: Ambulatory Visit | Attending: Gastroenterology | Admitting: Gastroenterology

## 2015-11-07 DIAGNOSIS — D127 Benign neoplasm of rectosigmoid junction: Secondary | ICD-10-CM | POA: Insufficient documentation

## 2015-11-07 DIAGNOSIS — E119 Type 2 diabetes mellitus without complications: Secondary | ICD-10-CM | POA: Insufficient documentation

## 2015-11-07 DIAGNOSIS — E785 Hyperlipidemia, unspecified: Secondary | ICD-10-CM | POA: Insufficient documentation

## 2015-11-07 DIAGNOSIS — F17200 Nicotine dependence, unspecified, uncomplicated: Secondary | ICD-10-CM | POA: Diagnosis not present

## 2015-11-07 DIAGNOSIS — I2510 Atherosclerotic heart disease of native coronary artery without angina pectoris: Secondary | ICD-10-CM | POA: Diagnosis not present

## 2015-11-07 DIAGNOSIS — I10 Essential (primary) hypertension: Secondary | ICD-10-CM | POA: Insufficient documentation

## 2015-11-07 DIAGNOSIS — Z794 Long term (current) use of insulin: Secondary | ICD-10-CM | POA: Insufficient documentation

## 2015-11-07 DIAGNOSIS — K621 Rectal polyp: Secondary | ICD-10-CM | POA: Insufficient documentation

## 2015-11-07 DIAGNOSIS — Z7982 Long term (current) use of aspirin: Secondary | ICD-10-CM | POA: Insufficient documentation

## 2015-11-07 DIAGNOSIS — K5730 Diverticulosis of large intestine without perforation or abscess without bleeding: Secondary | ICD-10-CM | POA: Diagnosis not present

## 2015-11-07 DIAGNOSIS — D124 Benign neoplasm of descending colon: Secondary | ICD-10-CM | POA: Insufficient documentation

## 2015-11-07 DIAGNOSIS — D125 Benign neoplasm of sigmoid colon: Secondary | ICD-10-CM | POA: Insufficient documentation

## 2015-11-07 DIAGNOSIS — Z1211 Encounter for screening for malignant neoplasm of colon: Secondary | ICD-10-CM | POA: Diagnosis present

## 2015-11-07 HISTORY — PX: COLONOSCOPY WITH PROPOFOL: SHX5780

## 2015-11-07 LAB — GLUCOSE, CAPILLARY: Glucose-Capillary: 124 mg/dL — ABNORMAL HIGH (ref 65–99)

## 2015-11-07 SURGERY — COLONOSCOPY WITH PROPOFOL
Anesthesia: General

## 2015-11-07 MED ORDER — PROPOFOL 500 MG/50ML IV EMUL
INTRAVENOUS | Status: DC | PRN
  Administered 2015-11-07: 160 ug/kg/min via INTRAVENOUS

## 2015-11-07 MED ORDER — MIDAZOLAM HCL 5 MG/5ML IJ SOLN
INTRAMUSCULAR | Status: DC | PRN
  Administered 2015-11-07: 1 mg via INTRAVENOUS

## 2015-11-07 MED ORDER — SODIUM CHLORIDE 0.9 % IV SOLN
INTRAVENOUS | Status: DC
  Administered 2015-11-07: 07:00:00 via INTRAVENOUS

## 2015-11-07 MED ORDER — LIDOCAINE 2% (20 MG/ML) 5 ML SYRINGE
INTRAMUSCULAR | Status: DC | PRN
  Administered 2015-11-07: 40 mg via INTRAVENOUS

## 2015-11-07 MED ORDER — PHENYLEPHRINE HCL 10 MG/ML IJ SOLN
INTRAMUSCULAR | Status: DC | PRN
  Administered 2015-11-07 (×2): 100 ug via INTRAVENOUS

## 2015-11-07 MED ORDER — SODIUM CHLORIDE 0.9 % IV SOLN: INTRAVENOUS | Status: DC

## 2015-11-07 MED ORDER — PROPOFOL 10 MG/ML IV BOLUS
INTRAVENOUS | Status: DC | PRN
  Administered 2015-11-07: 100 mg via INTRAVENOUS

## 2015-11-07 MED ORDER — FENTANYL CITRATE (PF) 100 MCG/2ML IJ SOLN
INTRAMUSCULAR | Status: DC | PRN
  Administered 2015-11-07: 50 ug via INTRAVENOUS

## 2015-11-07 NOTE — Anesthesia Postprocedure Evaluation (Signed)
Anesthesia Post Note  Patient: Alexander Cobb  Procedure(s) Performed: Procedure(s) (LRB): COLONOSCOPY WITH PROPOFOL (N/A)  Patient location during evaluation: PACU Anesthesia Type: General Level of consciousness: awake and alert Pain management: pain level controlled Vital Signs Assessment: post-procedure vital signs reviewed and stable Respiratory status: spontaneous breathing, nonlabored ventilation, respiratory function stable and patient connected to nasal cannula oxygen Cardiovascular status: blood pressure returned to baseline and stable Postop Assessment: no signs of nausea or vomiting Anesthetic complications: no    Last Vitals:  Vitals:   11/07/15 0835 11/07/15 0855  BP: (!) 84/52 98/70  Pulse: (!) 55   Resp: 12   Temp:      Last Pain:  Vitals:   11/07/15 0827  TempSrc: Tympanic                 Molli Barrows

## 2015-11-07 NOTE — H&P (Signed)
Outpatient short stay form Pre-procedure 11/07/2015 7:48 AM Lollie Sails MD  Primary Physician: Dr. Frazier Richards  Reason for visit:  Screening colonoscopy  History of present illness:  Patient is a 65 year old male presenting today as above. He tolerated his prep well. He takes no blood thinning agents. He does take a mini dose aspirin that has been held. He takes no other aspirin products.    Current Facility-Administered Medications:  .  0.9 %  sodium chloride infusion, , Intravenous, Continuous, Lollie Sails, MD, Last Rate: 20 mL/hr at 11/07/15 0725 .  0.9 %  sodium chloride infusion, , Intravenous, Continuous, Lollie Sails, MD  Prescriptions Prior to Admission  Medication Sig Dispense Refill Last Dose  . amLODipine (NORVASC) 10 MG tablet Take 1 tablet (10 mg total) by mouth daily. 90 tablet 3 11/06/2015 at Unknown time  . aspirin 81 MG tablet Take 81 mg by mouth daily. Reported on 06/30/2015   Past Week at Unknown time  . LANTUS SOLOSTAR 100 UNIT/ML Solostar Pen Reported on 06/30/2015  3 11/06/2015 at Unknown time  . pravastatin (PRAVACHOL) 40 MG tablet Take 1 tablet (40 mg total) by mouth daily. 90 tablet 3 11/06/2015 at Unknown time  . glucose blood (ONETOUCH VERIO) test strip Test sugars three times daily (Patient not taking: Reported on 06/30/2015) 100 each 12 Not Taking  . nitroGLYCERIN (NITROSTAT) 0.4 MG SL tablet Place 1 tablet (0.4 mg total) under the tongue every 5 (five) minutes as needed for chest pain. (Patient not taking: Reported on 06/30/2015) 25 tablet 6 Not Taking  . ONETOUCH DELICA LANCETS 99991111 MISC Test sugars three times daily (Patient not taking: Reported on 06/30/2015) 100 each 11 Not Taking  . varenicline (CHANTIX CONTINUING MONTH PAK) 1 MG tablet Take 1 tablet (1 mg total) by mouth 2 (two) times daily. (Patient not taking: Reported on 11/07/2015) 60 tablet 6 Not Taking at Unknown time     Allergies  Allergen Reactions  . Sulfa Drugs Cross Reactors  Hives    Hives      Past Medical History:  Diagnosis Date  . CAD (coronary artery disease) 12/21/2010  . Cardiomyopathy   . Diabetes mellitus without complication ([place])   . Fluttering heart ([place]) 12/21/2010  . HTN (hypertension) 12/21/2010  . Hyperlipidemia     Review of systems:      Physical Exam    Heart and lungs: Regular rate and rhythm without rub or gallop, lungs are bilaterally clear.    HEENT: Normocephalic atraumatic eyes are anicteric    Other:     Pertinant exam for procedure: Soft nontender nondistended bowel sounds positive normoactive.    Planned proceedures: Colonoscopy and indicated procedures. I have discussed the risks benefits and complications of procedures to include not limited to bleeding, infection, perforation and the risk of sedation and the patient wishes to proceed.    Lollie Sails, MD Gastroenterology 11/07/2015  7:48 AM

## 2015-11-07 NOTE — Op Note (Signed)
[facility] Gastroenterology Patient Name: Alexander Cobb Procedure Date: 11/07/2015 7:38 AM MRN: [id number] Account #: [id number] Date of Birth: 08-01-1950 Admit Type: Outpatient Age: 65 Room: [facility] ENDO ROOM 3 Gender: Male Note Status: Finalized Procedure:            Colonoscopy Indications:          Screening for colorectal malignant neoplasm Providers:            Lollie Sails, MD Referring MD:         Ocie Cornfield. [name] MD, MD (Referring MD) Medicines:            Monitored Anesthesia Care Complications:        No immediate complications. Procedure:            Pre-Anesthesia Assessment:                       - ASA Grade Assessment: III - A patient with severe                        systemic disease.                       After obtaining informed consent, the colonoscope was                        passed under direct vision. Throughout the procedure,                        the patient's blood pressure, pulse, and oxygen                        saturations were monitored continuously. The                        Colonoscope was introduced through the anus and                        advanced to the the cecum, identified by appendiceal                        orifice and ileocecal valve. The colonoscopy was                        performed without difficulty. Findings:      Multiple small-mouthed diverticula were found in the sigmoid colon and       descending colon.      A 6 mm polyp was found in the descending colon. The polyp was flat. The       polyp was removed with a cold snare. Resection and retrieval were       complete.      Two sessile polyps were found in the proximal descending colon. The       polyps were 1 to 2 mm in size. These polyps were removed with a cold       biopsy forceps. Resection and retrieval were complete.      -pale polypoid lesion removed from the descending colon with a cold       forcep, about 2 mm in size      A 2 mm polyp was  found in the mid sigmoid colon. The polyp was sessile.  The polyp was removed with a cold biopsy forceps. Resection and       retrieval were complete.      A 3 mm polyp was found in the recto-sigmoid colon. The polyp was       sessile. The polyp was removed with a cold biopsy forceps. Resection and       retrieval were complete.      Two sessile polyps were found in the rectum. The polyps were 2 to 3 mm       in size. These polyps were removed with a cold biopsy forceps. Resection       and retrieval were complete.      The retroflexed view of the distal rectum and anal verge was normal and       showed no anal or rectal abnormalities.      The digital rectal exam was normal. Impression:           - Diverticulosis in the sigmoid colon and in the                        descending colon.                       - One 6 mm polyp in the descending colon, removed with                        a cold snare. Resected and retrieved.                       - Two 1 to 2 mm polyps in the proximal descending                        colon, removed with a cold biopsy forceps. Resected and                        retrieved.                       - One 2 mm polyp in the mid sigmoid colon, removed with                        a cold biopsy forceps. Resected and retrieved.                       - One 3 mm polyp at the recto-sigmoid colon, removed                        with a cold biopsy forceps. Resected and retrieved.                       - Two 2 to 3 mm polyps in the rectum, removed with a                        cold biopsy forceps. Resected and retrieved.                       - The distal rectum and anal verge are normal on                        retroflexion view. Recommendation:       -  Await pathology results.                       - Telephone GI clinic for pathology results in 1 week. Procedure Code(s):    --- Professional ---                       [phone], Colonoscopy, flexible; with removal of  tumor(s),                        polyp(s), or other lesion(s) by snare technique                       45380, 14, Colonoscopy, flexible; with biopsy, single                        or multiple Diagnosis Code(s):    --- Professional ---                       Z12.11, Encounter for screening for malignant neoplasm                        of colon                       D12.4, Benign neoplasm of descending colon                       D12.5, Benign neoplasm of sigmoid colon                       D12.7, Benign neoplasm of rectosigmoid junction                       K62.1, Rectal polyp                       K57.30, Diverticulosis of large intestine without                        perforation or abscess without bleeding CPT copyright 2016 American [employer] Association. All rights reserved. The codes documented in this report are preliminary and upon coder review may  be revised to meet current compliance requirements. Lollie Sails, MD 11/07/2015 8:26:25 AM This report has been signed electronically. Number of Addenda: 0 Note Initiated On: 11/07/2015 7:38 AM Scope Withdrawal Time: 0 hours 20 minutes 27 seconds  Total Procedure Duration: 0 hours 24 minutes 1 second       [facility]

## 2015-11-07 NOTE — Anesthesia Preprocedure Evaluation (Signed)
Anesthesia Evaluation  Patient identified by MRN, date of birth, ID band Patient awake    Reviewed: Allergy & Precautions, H&P , NPO status , Patient's Chart, lab work & pertinent test results, reviewed documented beta blocker date and time   Airway Mallampati: III   Neck ROM: full    Dental  (+) Teeth Intact   Pulmonary neg pulmonary ROS, Current Smoker,    Pulmonary exam normal        Cardiovascular hypertension, negative cardio ROS Normal cardiovascular exam Rhythm:regular Rate:Normal     Neuro/Psych negative neurological ROS  negative psych ROS   GI/Hepatic negative GI ROS, Neg liver ROS,   Endo/Other  negative endocrine ROSdiabetes  Renal/GU negative Renal ROS  negative genitourinary   Musculoskeletal   Abdominal   Peds  Hematology negative hematology ROS (+)   Anesthesia Other Findings Past Medical History: 12/21/2010: CAD (coronary artery disease) No date: Cardiomyopathy No date: Diabetes mellitus without complication ([place]) 123456: Fluttering heart ([place]) 12/21/2010: HTN (hypertension) No date: Hyperlipidemia Past Surgical History: No date: CARDIAC CATHETERIZATION BMI    Body Mass Index:  32.93 kg/m     Reproductive/Obstetrics                             Anesthesia Physical Anesthesia Plan  ASA: III  Anesthesia Plan: General   Post-op Pain Management:    Induction:   Airway Management Planned:   Additional Equipment:   Intra-op Plan:   Post-operative Plan:   Informed Consent: I have reviewed the patients History and Physical, chart, labs and discussed the procedure including the risks, benefits and alternatives for the proposed anesthesia with the patient or authorized representative who has indicated his/her understanding and acceptance.   Dental Advisory Given  Plan Discussed with: CRNA  Anesthesia Plan Comments:         Anesthesia Quick  Evaluation

## 2015-11-07 NOTE — Transfer of Care (Signed)
Immediate Anesthesia Transfer of Care Note  Patient: Alexander Cobb  Procedure(s) Performed: Procedure(s): COLONOSCOPY WITH PROPOFOL (N/A)  Patient Location: PACU and Endoscopy Unit  Anesthesia Type:General  Level of Consciousness: sedated  Airway & Oxygen Therapy: Patient Spontanous Breathing and Patient connected to nasal cannula oxygen  Post-op Assessment: Report given to RN and Post -op Vital signs reviewed and stable  Post vital signs: Reviewed and stable  Last Vitals:  Vitals:   11/07/15 0703  BP: 131/68  Pulse: 76  Resp: 18  Temp: 36.8 C    Last Pain:  Vitals:   11/07/15 0703  TempSrc: Tympanic         Complications: No apparent anesthesia complications

## 2015-11-08 ENCOUNTER — Encounter: Payer: Self-pay | Admitting: Gastroenterology

## 2015-11-08 LAB — SURGICAL PATHOLOGY

## 2015-11-27 ENCOUNTER — Other Ambulatory Visit: Payer: Self-pay | Admitting: Internal Medicine

## 2015-11-27 DIAGNOSIS — R1031 Right lower quadrant pain: Secondary | ICD-10-CM

## 2015-11-27 DIAGNOSIS — R1032 Left lower quadrant pain: Principal | ICD-10-CM

## 2015-12-11 ENCOUNTER — Ambulatory Visit
Admission: RE | Admit: 2015-12-11 | Discharge: 2015-12-11 | Disposition: A | Discharge status: Home / Self Care | Payer: BLUE CROSS/BLUE SHIELD | Source: Ambulatory Visit | Attending: Internal Medicine | Admitting: Internal Medicine

## 2015-12-11 DIAGNOSIS — K760 Fatty (change of) liver, not elsewhere classified: Secondary | ICD-10-CM | POA: Diagnosis not present

## 2015-12-11 DIAGNOSIS — R1031 Right lower quadrant pain: Secondary | ICD-10-CM | POA: Diagnosis present

## 2015-12-11 DIAGNOSIS — R1032 Left lower quadrant pain: Secondary | ICD-10-CM | POA: Insufficient documentation

## 2015-12-11 DIAGNOSIS — K5790 Diverticulosis of intestine, part unspecified, without perforation or abscess without bleeding: Secondary | ICD-10-CM | POA: Insufficient documentation

## 2015-12-11 MED ORDER — IOPAMIDOL (ISOVUE-300) INJECTION 61%
100.0000 mL | Freq: Once | INTRAVENOUS | Status: AC | PRN
  Administered 2015-12-11: 100 mL via INTRAVENOUS

## 2015-12-28 ENCOUNTER — Encounter: Payer: Self-pay | Admitting: Cardiovascular Disease

## 2015-12-28 ENCOUNTER — Ambulatory Visit (INDEPENDENT_AMBULATORY_CARE_PROVIDER_SITE_OTHER): Payer: BLUE CROSS/BLUE SHIELD | Admitting: Cardiovascular Disease

## 2015-12-28 VITALS — BP 154/80 | HR 71 | Ht 69.0 in | Wt 209.5 lb

## 2015-12-28 DIAGNOSIS — E7800 Pure hypercholesterolemia, unspecified: Secondary | ICD-10-CM | POA: Diagnosis not present

## 2015-12-28 DIAGNOSIS — F17200 Nicotine dependence, unspecified, uncomplicated: Secondary | ICD-10-CM

## 2015-12-28 DIAGNOSIS — I420 Dilated cardiomyopathy: Secondary | ICD-10-CM | POA: Diagnosis not present

## 2015-12-28 DIAGNOSIS — I10 Essential (primary) hypertension: Secondary | ICD-10-CM | POA: Diagnosis not present

## 2015-12-28 DIAGNOSIS — E1165 Type 2 diabetes mellitus with hyperglycemia: Secondary | ICD-10-CM

## 2015-12-28 DIAGNOSIS — I2510 Atherosclerotic heart disease of native coronary artery without angina pectoris: Secondary | ICD-10-CM

## 2015-12-28 MED ORDER — LISINOPRIL 20 MG PO TABS: 20.0000 mg | ORAL_TABLET | Freq: Every day | ORAL | 3 refills | Status: DC

## 2015-12-28 NOTE — Progress Notes (Signed)
No old Cardiology Office Note  Date:  12/28/2015   ID:  Alexander Cobb, DOB 1950/06/20, MRN [id number]  PCP:  Alexander Cobb., MD   Chief Complaint  Patient presents with  . other    6 month follow up. Meds reviewed by the pt. verbally. "doing well."     HPI:  [name] is a very pleasant 65 year old gentleman with a history of smoking, who continues to smoke, history of poorly controlled diabetes, nonischemic cardiopathy, Non-STEMI, ejection fraction by catheterization estimated at 33% in May of 2012, mild proximal RCA disease, 75% PDA disease, who presents for routine followup of his coronary artery disease  He works with heavy machinery. Previous event monitor showing SVT  In follow-up today, he reports that he feels well,  denies any chest pain concerning for angina Unable to tolerate Chantix, developed nausea  Lab work reviewed with him showing HBA1C 11.4 to 7.5 Back on insulin and metformin  He does not check his blood pressure at home, On his last visit we had recommended he stay on lisinopril. He is currently not taking this. He is taking amlodipine Was previously on carvedilol, he stopped this on his own  EKG on today's visit shows normal sinus rhythm with rate 71 bpm, no significant ST or T-wave changes  Other past medical history reviewed Previous event monitor from November 2016 reviewed with him showing one run of SVT otherwise normal sinus rhythm  EKG on today's visit shows normal sinus rhythm with rate 72 bpm, nonspecific ST abnormality anterolateral leads  Other past medical history chest pain in August 2016. Had discomfort down his left arm, into his fingers. Reported that his flank underneath his arm in his armpit was hurting.  It was reproducible or hurt with palpation. He took nitroglycerin 3 with improvement of his symptoms. He have recurrent symptoms the next day and took nitroglycerin 1 Since then has had no further symptoms   September 2016  he has had increasing lightheadedness associated with chest fluttering/arrhythmia.   The symptoms make him feel weak,. Symptoms can go up to 5-10 minutes, on average 2-3 minutes at a time.   Previous lab work showing diabetes poorly controlled. He reports that he no longer has followup with Dr. Dr. Gabriel Carina of endocrine. hemoglobin A1c previously more than 12. He continues to smoke one pack per day. Previous problems with sleep  Echocardiogram 2012 showed ejection fraction 40-45%. Discharge summary from May of 2012 mentions he had episodes of SVT though there is no significant detail.   PMH:   has a past medical history of CAD (coronary artery disease) (12/21/2010); Cardiomyopathy; Diabetes mellitus without complication ([place]); Fluttering heart (12/21/2010); HTN (hypertension) (12/21/2010); and Hyperlipidemia.  PSH:    Past Surgical History:  Procedure Laterality Date  . CARDIAC CATHETERIZATION    . COLONOSCOPY WITH PROPOFOL N/A 11/07/2015   Procedure: COLONOSCOPY WITH PROPOFOL;  Surgeon: Lollie Sails, MD;  Location: [facility] ENDOSCOPY;  Service: Endoscopy;  Laterality: N/A;    Current Outpatient Prescriptions  Medication Sig Dispense Refill  . amLODipine (NORVASC) 10 MG tablet Take 1 tablet (10 mg total) by mouth daily. 90 tablet 3  . aspirin 81 MG tablet Take 81 mg by mouth daily. Reported on 06/30/2015    . glucose blood (ONETOUCH VERIO) test strip Test sugars three times daily 100 each 12  . LANTUS SOLOSTAR 100 UNIT/ML Solostar Pen Reported on 06/30/2015  3  . nitroGLYCERIN (NITROSTAT) 0.4 MG SL tablet Place 1 tablet (0.4 mg total) under  the tongue every 5 (five) minutes as needed for chest pain. 25 tablet 6  . ONETOUCH DELICA LANCETS 99991111 MISC Test sugars three times daily 100 each 11  . pravastatin (PRAVACHOL) 40 MG tablet Take 1 tablet (40 mg total) by mouth daily. 90 tablet 3  . varenicline (CHANTIX CONTINUING MONTH PAK) 1 MG tablet Take 1 tablet (1 mg total) by mouth 2 (two)  times daily. 60 tablet 6  . lisinopril (PRINIVIL,ZESTRIL) 20 MG tablet Take 1 tablet (20 mg total) by mouth daily. 90 tablet 3  . metFORMIN (GLUCOPHAGE) 500 MG tablet Take 1 tablet (500 mg total) by mouth 2 (two) times daily with a meal.     No current facility-administered medications for this visit.      Allergies:   Sulfa drugs cross reactors   Social History:  The patient  reports that he has been smoking Cigarettes.  He has a 40.00 pack-year smoking history. He has never used smokeless tobacco. He reports that he does not drink alcohol or use drugs.   Family History:   family history includes COPD in his father.    Review of Systems: Review of Systems  Constitutional: Negative.   Respiratory: Negative.   Cardiovascular: Negative.   Gastrointestinal: Negative.   Musculoskeletal: Negative.   Neurological: Negative.   Psychiatric/Behavioral: Negative.   All other systems reviewed and are negative.    PHYSICAL EXAM: VS:  BP (!) 154/80 (BP Location: Left Arm, Patient Position: Sitting, Cuff Size: Normal)   Pulse 71   Ht 5\' 9"  (1.753 m)   Wt 209 lb 8 oz (95 kg)   BMI 30.94 kg/m  , BMI Body mass index is 30.94 kg/m. GEN: Well nourished, well developed, in no acute distress  HEENT: normal  Neck: no JVD, carotid bruits, or masses Cardiac: RRR; no murmurs, rubs, or gallops,no edema  Respiratory:  clear to auscultation bilaterally, normal work of breathing GI: soft, nontender, nondistended, + BS MS: no deformity or atrophy  Skin: warm and dry, no rash Neuro:  Strength and sensation are intact Psych: euthymic mood, full affect    Recent Labs: No results found for requested labs within last 8760 hours.    Lipid Panel Lab Results  Component Value Date   CHOL 177 06/10/2014   HDL 42 06/10/2014   LDLCALC 118 (H) 06/10/2014   TRIG 86 06/10/2014      Wt Readings from Last 3 Encounters:  12/28/15 209 lb 8 oz (95 kg)  11/07/15 223 lb (101.2 kg)  06/30/15 192 lb 8 oz  (87.3 kg)       ASSESSMENT AND PLAN:  Coronary artery disease involving native coronary artery of native heart without angina pectoris - Plan: EKG 12-Lead Currently with no symptoms of angina. No further workup at this time. Continue current medication regimen.  Congestive dilated cardiomyopathy ([place]) - Plan: EKG 12-Lead Recommended he start lisinopril In follow-up office visit will consider restarting carvedilol given previously estimated low ejection fraction  Essential hypertension - Plan: EKG 12-Lead Blood pressure elevated today, continue amlodipine, add lisinopril 20 mg daily  Pure hypercholesterolemia Tolerating pravastatin daily Goal LDL less than 70  Smoking Long discussion concerning his smoking Unable to tolerate Chantix  Poorly controlled type 2 diabetes mellitus ([place]) Improvement in his A1c, recently 7.4   Total encounter time more than 25 minutes  Greater than 50% was spent in counseling and coordination of care with the patient   Disposition:   F/U  6 months   Orders  Placed This Encounter  Procedures  . EKG 12-Lead     Signed, Esmond Plants, M.D., Ph.D. 12/28/2015  [place], [place]

## 2015-12-28 NOTE — Patient Instructions (Addendum)
Medication Instructions:   Please start lisinopril one a day for blood pressure  Labwork:  No new labs needed  Testing/Procedures:  No further testing at this time   Follow-Up: It was a pleasure seeing you in the office today. Please call us if you have new issues that need to be addressed before your next appt.  [phone]  Your physician wants you to follow-up in: 6 months.  You will receive a reminder letter in the mail two months in advance. If you don't receive a letter, please call our office to schedule the follow-up appointment.  If you need a refill on your cardiac medications before your next appointment, please call your pharmacy.

## 2017-11-25 DIAGNOSIS — B353 Tinea pedis: Secondary | ICD-10-CM | POA: Diagnosis not present

## 2017-11-25 DIAGNOSIS — B354 Tinea corporis: Secondary | ICD-10-CM | POA: Diagnosis not present

## 2018-01-13 DIAGNOSIS — L853 Xerosis cutis: Secondary | ICD-10-CM | POA: Diagnosis not present

## 2018-01-13 DIAGNOSIS — Z85828 Personal history of other malignant neoplasm of skin: Secondary | ICD-10-CM | POA: Diagnosis not present

## 2018-01-13 DIAGNOSIS — L718 Other rosacea: Secondary | ICD-10-CM | POA: Diagnosis not present

## 2018-01-13 DIAGNOSIS — Z08 Encounter for follow-up examination after completed treatment for malignant neoplasm: Secondary | ICD-10-CM | POA: Diagnosis not present

## 2018-05-16 ENCOUNTER — Inpatient Hospital Stay: Payer: Medicare HMO

## 2018-05-16 ENCOUNTER — Emergency Department: Payer: Medicare HMO

## 2018-05-16 ENCOUNTER — Other Ambulatory Visit: Payer: Self-pay

## 2018-05-16 ENCOUNTER — Inpatient Hospital Stay
Admission: EM | Admit: 2018-05-16 | Discharge: 2018-05-20 | DRG: 208 | Disposition: A | Discharge status: Home Health | Payer: Medicare HMO | Attending: Internal Medicine | Admitting: Internal Medicine

## 2018-05-16 ENCOUNTER — Inpatient Hospital Stay (HOSPITAL_COMMUNITY)
Admit: 2018-05-16 | Discharge: 2018-05-16 | Disposition: A | Discharge status: Home / Self Care | Payer: Medicare HMO | Attending: Adult Health | Admitting: Adult Health

## 2018-05-16 DIAGNOSIS — I255 Ischemic cardiomyopathy: Secondary | ICD-10-CM | POA: Diagnosis present

## 2018-05-16 DIAGNOSIS — E1165 Type 2 diabetes mellitus with hyperglycemia: Secondary | ICD-10-CM | POA: Diagnosis present

## 2018-05-16 DIAGNOSIS — J810 Acute pulmonary edema: Secondary | ICD-10-CM

## 2018-05-16 DIAGNOSIS — J189 Pneumonia, unspecified organism: Secondary | ICD-10-CM | POA: Diagnosis present

## 2018-05-16 DIAGNOSIS — Z7902 Long term (current) use of antithrombotics/antiplatelets: Secondary | ICD-10-CM

## 2018-05-16 DIAGNOSIS — I10 Essential (primary) hypertension: Secondary | ICD-10-CM | POA: Diagnosis present

## 2018-05-16 DIAGNOSIS — F17200 Nicotine dependence, unspecified, uncomplicated: Secondary | ICD-10-CM | POA: Diagnosis not present

## 2018-05-16 DIAGNOSIS — J441 Chronic obstructive pulmonary disease with (acute) exacerbation: Secondary | ICD-10-CM | POA: Diagnosis present

## 2018-05-16 DIAGNOSIS — R0602 Shortness of breath: Secondary | ICD-10-CM | POA: Diagnosis present

## 2018-05-16 DIAGNOSIS — J440 Chronic obstructive pulmonary disease with acute lower respiratory infection: Secondary | ICD-10-CM | POA: Diagnosis present

## 2018-05-16 DIAGNOSIS — J9690 Respiratory failure, unspecified, unspecified whether with hypoxia or hypercapnia: Secondary | ICD-10-CM | POA: Diagnosis not present

## 2018-05-16 DIAGNOSIS — I2510 Atherosclerotic heart disease of native coronary artery without angina pectoris: Secondary | ICD-10-CM | POA: Diagnosis present

## 2018-05-16 DIAGNOSIS — E118 Type 2 diabetes mellitus with unspecified complications: Secondary | ICD-10-CM | POA: Diagnosis not present

## 2018-05-16 DIAGNOSIS — L989 Disorder of the skin and subcutaneous tissue, unspecified: Secondary | ICD-10-CM | POA: Diagnosis not present

## 2018-05-16 DIAGNOSIS — Z8674 Personal history of sudden cardiac arrest: Secondary | ICD-10-CM | POA: Diagnosis not present

## 2018-05-16 DIAGNOSIS — J9602 Acute respiratory failure with hypercapnia: Secondary | ICD-10-CM | POA: Diagnosis present

## 2018-05-16 DIAGNOSIS — R0689 Other abnormalities of breathing: Secondary | ICD-10-CM | POA: Diagnosis not present

## 2018-05-16 DIAGNOSIS — Z881 Allergy status to other antibiotic agents status: Secondary | ICD-10-CM | POA: Diagnosis not present

## 2018-05-16 DIAGNOSIS — Z9114 Patient's other noncompliance with medication regimen: Secondary | ICD-10-CM | POA: Diagnosis not present

## 2018-05-16 DIAGNOSIS — E785 Hyperlipidemia, unspecified: Secondary | ICD-10-CM | POA: Diagnosis present

## 2018-05-16 DIAGNOSIS — Z794 Long term (current) use of insulin: Secondary | ICD-10-CM

## 2018-05-16 DIAGNOSIS — F17210 Nicotine dependence, cigarettes, uncomplicated: Secondary | ICD-10-CM | POA: Diagnosis present

## 2018-05-16 DIAGNOSIS — J449 Chronic obstructive pulmonary disease, unspecified: Secondary | ICD-10-CM | POA: Diagnosis not present

## 2018-05-16 DIAGNOSIS — J9601 Acute respiratory failure with hypoxia: Secondary | ICD-10-CM | POA: Diagnosis present

## 2018-05-16 DIAGNOSIS — B9561 Methicillin susceptible Staphylococcus aureus infection as the cause of diseases classified elsewhere: Secondary | ICD-10-CM | POA: Diagnosis present

## 2018-05-16 DIAGNOSIS — L0231 Cutaneous abscess of buttock: Secondary | ICD-10-CM | POA: Diagnosis not present

## 2018-05-16 DIAGNOSIS — Z825 Family history of asthma and other chronic lower respiratory diseases: Secondary | ICD-10-CM

## 2018-05-16 DIAGNOSIS — G4733 Obstructive sleep apnea (adult) (pediatric): Secondary | ICD-10-CM | POA: Diagnosis present

## 2018-05-16 DIAGNOSIS — L02415 Cutaneous abscess of right lower limb: Secondary | ICD-10-CM | POA: Diagnosis not present

## 2018-05-16 DIAGNOSIS — Z23 Encounter for immunization: Secondary | ICD-10-CM | POA: Diagnosis present

## 2018-05-16 DIAGNOSIS — Z7982 Long term (current) use of aspirin: Secondary | ICD-10-CM | POA: Diagnosis not present

## 2018-05-16 DIAGNOSIS — R7881 Bacteremia: Secondary | ICD-10-CM | POA: Diagnosis not present

## 2018-05-16 DIAGNOSIS — Z79899 Other long term (current) drug therapy: Secondary | ICD-10-CM | POA: Diagnosis not present

## 2018-05-16 DIAGNOSIS — L299 Pruritus, unspecified: Secondary | ICD-10-CM | POA: Diagnosis not present

## 2018-05-16 DIAGNOSIS — R0902 Hypoxemia: Secondary | ICD-10-CM

## 2018-05-16 HISTORY — DX: Obstructive sleep apnea (adult) (pediatric): G47.33

## 2018-05-16 LAB — BLOOD CULTURE ID PANEL (REFLEXED)

## 2018-05-16 LAB — COMPREHENSIVE METABOLIC PANEL
ALT: 16 U/L (ref 0–44)
AST: 21 U/L (ref 15–41)
Albumin: 3.8 g/dL (ref 3.5–5.0)
Alkaline Phosphatase: 64 U/L (ref 38–126)
Anion gap: 15 (ref 5–15)
BUN: 12 mg/dL (ref 8–23)
CO2: 20 mmol/L — ABNORMAL LOW (ref 22–32)
Calcium: 8.3 mg/dL — ABNORMAL LOW (ref 8.9–10.3)
Chloride: 96 mmol/L — ABNORMAL LOW (ref 98–111)
Creatinine, Ser: 1.16 mg/dL (ref 0.61–1.24)
GFR calc Af Amer: >60 mL/min (ref >60)
GFR calc non Af Amer: >60 mL/min (ref >60)
Glucose, Bld: 448 mg/dL — ABNORMAL HIGH (ref 70–99)
Potassium: 3.7 mmol/L (ref 3.5–5.1)
Sodium: 131 mmol/L — ABNORMAL LOW (ref 135–145)
Total Bilirubin: 0.9 mg/dL (ref 0.3–1.2)
Total Protein: 7.7 g/dL (ref 6.5–8.1)

## 2018-05-16 LAB — CBC WITH DIFFERENTIAL/PLATELET
Abs Immature Granulocytes: 0.17 10*3/uL — ABNORMAL HIGH (ref 0.00–0.07)
Basophils Absolute: 0.2 10*3/uL — ABNORMAL HIGH (ref 0.0–0.1)
Basophils Relative: 1 %
Eosinophils Absolute: 0.3 10*3/uL (ref 0.0–0.5)
Eosinophils Relative: 2 %
HCT: 49.3 % (ref 39.0–52.0)
Hemoglobin: 15.7 g/dL (ref 13.0–17.0)
Immature Granulocytes: 1 %
Lymphocytes Relative: 35 %
Lymphs Abs: 5.6 10*3/uL — ABNORMAL HIGH (ref 0.7–4.0)
MCH: 27.8 pg (ref 26.0–34.0)
MCHC: 31.8 g/dL (ref 30.0–36.0)
MCV: 87.4 fL (ref 80.0–100.0)
Monocytes Absolute: 1.2 10*3/uL — ABNORMAL HIGH (ref 0.1–1.0)
Monocytes Relative: 8 %
Neutro Abs: 8.7 10*3/uL — ABNORMAL HIGH (ref 1.7–7.7)
Neutrophils Relative %: 53 %
Platelets: 231 10*3/uL (ref 150–400)
RBC: 5.64 MIL/uL (ref 4.22–5.81)
RDW: 12.1 % (ref 11.5–15.5)
WBC: 16.1 10*3/uL — ABNORMAL HIGH (ref 4.0–10.5)
nRBC: 0 % (ref 0.0–0.2)

## 2018-05-16 LAB — BLOOD GAS, ARTERIAL
Acid-base deficit: 1.1 mmol/L (ref 0.0–2.0)
Bicarbonate: 24.3 mmol/L (ref 20.0–28.0)
FIO2: 0.5
MECHVT: 580 mL
O2 Saturation: 96.6 %
PEEP: 5 cmH2O
Patient temperature: 37
RATE: 22 resp/min
pCO2 arterial: 42 mmHg (ref 32.0–48.0)
pH, Arterial: 7.37 (ref 7.350–7.450)
pO2, Arterial: 89 mmHg (ref 83.0–108.0)

## 2018-05-16 LAB — URINALYSIS, COMPLETE (UACMP) WITH MICROSCOPIC
Bacteria, UA: NONE SEEN
Bilirubin Urine: NEGATIVE
Glucose, UA: >=500 mg/dL — AB
Ketones, ur: NEGATIVE mg/dL
Leukocytes,Ua: NEGATIVE
Nitrite: NEGATIVE
Protein, ur: 100 mg/dL — AB
Specific Gravity, Urine: 1.018 (ref 1.005–1.030)
pH: 6 (ref 5.0–8.0)

## 2018-05-16 LAB — URINE DRUG SCREEN, QUALITATIVE (ARMC ONLY)
Amphetamines, Ur Screen: NOT DETECTED
Barbiturates, Ur Screen: NOT DETECTED
Benzodiazepine, Ur Scrn: NOT DETECTED
Cannabinoid 50 Ng, Ur ~~LOC~~: NOT DETECTED
Cocaine Metabolite,Ur ~~LOC~~: NOT DETECTED
MDMA (Ecstasy)Ur Screen: NOT DETECTED
Methadone Scn, Ur: NOT DETECTED
Opiate, Ur Screen: NOT DETECTED
Phencyclidine (PCP) Ur S: NOT DETECTED
Tricyclic, Ur Screen: NOT DETECTED

## 2018-05-16 LAB — ECHOCARDIOGRAM COMPLETE
Height: 72 in
Weight: 3527.36 oz

## 2018-05-16 LAB — TROPONIN I
Troponin I: 0.03 ng/mL (ref <0.03)
Troponin I: 0.15 ng/mL (ref <0.03)
Troponin I: 0.18 ng/mL (ref <0.03)
Troponin I: 0.15 ng/mL (ref <0.03)

## 2018-05-16 LAB — GLUCOSE, CAPILLARY
Glucose-Capillary: 116 mg/dL — ABNORMAL HIGH (ref 70–99)
Glucose-Capillary: 134 mg/dL — ABNORMAL HIGH (ref 70–99)
Glucose-Capillary: 152 mg/dL — ABNORMAL HIGH (ref 70–99)
Glucose-Capillary: 171 mg/dL — ABNORMAL HIGH (ref 70–99)
Glucose-Capillary: 188 mg/dL — ABNORMAL HIGH (ref 70–99)
Glucose-Capillary: 192 mg/dL — ABNORMAL HIGH (ref 70–99)
Glucose-Capillary: 193 mg/dL — ABNORMAL HIGH (ref 70–99)
Glucose-Capillary: 248 mg/dL — ABNORMAL HIGH (ref 70–99)
Glucose-Capillary: 280 mg/dL — ABNORMAL HIGH (ref 70–99)
Glucose-Capillary: 318 mg/dL — ABNORMAL HIGH (ref 70–99)
Glucose-Capillary: 365 mg/dL — ABNORMAL HIGH (ref 70–99)
Glucose-Capillary: 404 mg/dL — ABNORMAL HIGH (ref 70–99)
Glucose-Capillary: 418 mg/dL — ABNORMAL HIGH (ref 70–99)

## 2018-05-16 LAB — ETHANOL: Alcohol, Ethyl (B): <10 mg/dL (ref <10)

## 2018-05-16 LAB — T4, FREE: Free T4: 1.53 ng/dL (ref 0.82–1.77)

## 2018-05-16 LAB — LACTIC ACID, PLASMA
Lactic Acid, Venous: 5.5 mmol/L (ref 0.5–1.9)
Lactic Acid, Venous: 1.6 mmol/L (ref 0.5–1.9)

## 2018-05-16 LAB — MRSA PCR SCREENING: MRSA by PCR: NEGATIVE

## 2018-05-16 LAB — INFLUENZA PANEL BY PCR (TYPE A & B)
Influenza A By PCR: NEGATIVE
Influenza B By PCR: NEGATIVE

## 2018-05-16 LAB — TSH: TSH: 0.012 u[IU]/mL — ABNORMAL LOW (ref 0.350–4.500)

## 2018-05-16 LAB — TRIGLYCERIDES: Triglycerides: 76 mg/dL (ref <150)

## 2018-05-16 LAB — PHOSPHORUS: Phosphorus: 4.1 mg/dL (ref 2.5–4.6)

## 2018-05-16 LAB — HEMOGLOBIN A1C
Hgb A1c MFr Bld: 11.9 % — ABNORMAL HIGH (ref 4.8–5.6)
Mean Plasma Glucose: 294.83 mg/dL

## 2018-05-16 LAB — MAGNESIUM: Magnesium: 2 mg/dL (ref 1.7–2.4)

## 2018-05-16 MED ORDER — FUROSEMIDE 10 MG/ML IJ SOLN
40.0000 mg | Freq: Once | INTRAMUSCULAR | Status: AC
  Administered 2018-05-16: 40 mg via INTRAVENOUS
  Filled 2018-05-16: qty 4

## 2018-05-16 MED ORDER — FAMOTIDINE IN NACL 20-0.9 MG/50ML-% IV SOLN
20.0000 mg | Freq: Two times a day (BID) | INTRAVENOUS | Status: DC
  Administered 2018-05-16: 20 mg via INTRAVENOUS
  Filled 2018-05-16 (×3): qty 50

## 2018-05-16 MED ORDER — IOPAMIDOL (ISOVUE-370) INJECTION 76%
100.0000 mL | Freq: Once | INTRAVENOUS | Status: AC | PRN
  Administered 2018-05-16: 100 mL via INTRAVENOUS

## 2018-05-16 MED ORDER — IPRATROPIUM-ALBUTEROL 0.5-2.5 (3) MG/3ML IN SOLN
3.0000 mL | Freq: Four times a day (QID) | RESPIRATORY_TRACT | Status: DC
  Administered 2018-05-16 – 2018-05-17 (×4): 3 mL via RESPIRATORY_TRACT
  Filled 2018-05-16 (×4): qty 3

## 2018-05-16 MED ORDER — INSULIN ASPART 100 UNIT/ML ~~LOC~~ SOLN: 0.0000 [IU] | SUBCUTANEOUS | Status: DC

## 2018-05-16 MED ORDER — INSULIN ASPART 100 UNIT/ML ~~LOC~~ SOLN
0.0000 [IU] | Freq: Three times a day (TID) | SUBCUTANEOUS | Status: DC
  Administered 2018-05-16: 11 [IU] via SUBCUTANEOUS
  Administered 2018-05-17: 4 [IU] via SUBCUTANEOUS
  Administered 2018-05-17: 7 [IU] via SUBCUTANEOUS
  Administered 2018-05-17: 20 [IU] via SUBCUTANEOUS
  Administered 2018-05-18 – 2018-05-19 (×2): 3 [IU] via SUBCUTANEOUS
  Administered 2018-05-19: 4 [IU] via SUBCUTANEOUS
  Administered 2018-05-19 – 2018-05-20 (×2): 3 [IU] via SUBCUTANEOUS
  Filled 2018-05-16 (×9): qty 1

## 2018-05-16 MED ORDER — INSULIN GLARGINE 100 UNIT/ML ~~LOC~~ SOLN
5.0000 [IU] | Freq: Every day | SUBCUTANEOUS | Status: DC
  Administered 2018-05-16: 5 [IU] via SUBCUTANEOUS
  Filled 2018-05-16 (×2): qty 0.05

## 2018-05-16 MED ORDER — PROPOFOL 10 MG/ML IV BOLUS
0.5000 mg/kg | Freq: Once | INTRAVENOUS | Status: AC
  Administered 2018-05-16: 50 mg via INTRAVENOUS

## 2018-05-16 MED ORDER — ETOMIDATE 2 MG/ML IV SOLN
INTRAVENOUS | Status: AC | PRN
  Administered 2018-05-16: 20 mg via INTRAVENOUS

## 2018-05-16 MED ORDER — ONDANSETRON HCL 4 MG/2ML IJ SOLN: 4.0000 mg | Freq: Four times a day (QID) | INTRAMUSCULAR | Status: DC | PRN

## 2018-05-16 MED ORDER — PROPOFOL 1000 MG/100ML IV EMUL
0.0000 ug/kg/min | INTRAVENOUS | Status: DC
  Administered 2018-05-16 (×2): 35 ug/kg/min via INTRAVENOUS
  Filled 2018-05-16 (×2): qty 100

## 2018-05-16 MED ORDER — LEVOFLOXACIN IN D5W 500 MG/100ML IV SOLN
500.0000 mg | INTRAVENOUS | Status: DC
  Administered 2018-05-16: 500 mg via INTRAVENOUS
  Filled 2018-05-16 (×2): qty 100

## 2018-05-16 MED ORDER — SUCCINYLCHOLINE CHLORIDE 20 MG/ML IJ SOLN
INTRAMUSCULAR | Status: AC | PRN
  Administered 2018-05-16: 120 mg via INTRAVENOUS

## 2018-05-16 MED ORDER — MIDAZOLAM 50MG/50ML (1MG/ML) PREMIX INFUSION
0.5000 mg/h | INTRAVENOUS | Status: DC
  Filled 2018-05-16: qty 50

## 2018-05-16 MED ORDER — CHLORHEXIDINE GLUCONATE 0.12% ORAL RINSE (MEDLINE KIT)
15.0000 mL | Freq: Two times a day (BID) | OROMUCOSAL | Status: DC
  Administered 2018-05-16: 15 mL via OROMUCOSAL

## 2018-05-16 MED ORDER — ACETAMINOPHEN 650 MG RE SUPP: 650.0000 mg | Freq: Four times a day (QID) | RECTAL | Status: DC | PRN

## 2018-05-16 MED ORDER — BISACODYL 10 MG RE SUPP: 10.0000 mg | Freq: Every day | RECTAL | Status: DC | PRN

## 2018-05-16 MED ORDER — SODIUM CHLORIDE 0.9 % IV SOLN
0.5000 mg/h | INTRAVENOUS | Status: DC
  Administered 2018-05-16: 0.5 mg/h via INTRAVENOUS
  Filled 2018-05-16: qty 10

## 2018-05-16 MED ORDER — FENTANYL CITRATE (PF) 100 MCG/2ML IJ SOLN
50.0000 ug | INTRAMUSCULAR | Status: DC | PRN
  Filled 2018-05-16 (×2): qty 2

## 2018-05-16 MED ORDER — INSULIN ASPART 100 UNIT/ML ~~LOC~~ SOLN: 0.0000 [IU] | Freq: Every day | SUBCUTANEOUS | Status: DC

## 2018-05-16 MED ORDER — DOCUSATE SODIUM 100 MG PO CAPS
100.0000 mg | ORAL_CAPSULE | Freq: Two times a day (BID) | ORAL | Status: DC
  Administered 2018-05-16 – 2018-05-17 (×3): 100 mg via ORAL
  Filled 2018-05-16 (×8): qty 1

## 2018-05-16 MED ORDER — BUDESONIDE 0.25 MG/2ML IN SUSP
0.2500 mg | Freq: Two times a day (BID) | RESPIRATORY_TRACT | Status: DC
  Administered 2018-05-16 (×2): 0.25 mg via RESPIRATORY_TRACT
  Filled 2018-05-16 (×2): qty 2

## 2018-05-16 MED ORDER — FENTANYL BOLUS VIA INFUSION
100.0000 ug | Freq: Once | INTRAVENOUS | Status: AC
  Administered 2018-05-16: 100 ug via INTRAVENOUS

## 2018-05-16 MED ORDER — ACETAMINOPHEN 325 MG PO TABS: 650.0000 mg | ORAL_TABLET | Freq: Four times a day (QID) | ORAL | Status: DC | PRN

## 2018-05-16 MED ORDER — CEFAZOLIN SODIUM-DEXTROSE 2-4 GM/100ML-% IV SOLN
2.0000 g | Freq: Three times a day (TID) | INTRAVENOUS | Status: DC
  Administered 2018-05-16 – 2018-05-20 (×12): 2 g via INTRAVENOUS
  Filled 2018-05-16 (×18): qty 100

## 2018-05-16 MED ORDER — PROPOFOL 1000 MG/100ML IV EMUL
5.0000 ug/kg/min | INTRAVENOUS | Status: DC
  Administered 2018-05-16: 5 ug/kg/min via INTRAVENOUS

## 2018-05-16 MED ORDER — METHYLPREDNISOLONE SODIUM SUCC 125 MG IJ SOLR
60.0000 mg | Freq: Two times a day (BID) | INTRAMUSCULAR | Status: DC
  Administered 2018-05-16 – 2018-05-17 (×3): 60 mg via INTRAVENOUS
  Filled 2018-05-16 (×3): qty 2

## 2018-05-16 MED ORDER — INSULIN REGULAR(HUMAN) IN NACL 100-0.9 UT/100ML-% IV SOLN
INTRAVENOUS | Status: DC
  Administered 2018-05-16: 3.6 [IU]/h via INTRAVENOUS
  Administered 2018-05-16: 3.8 [IU]/h via INTRAVENOUS
  Filled 2018-05-16: qty 100

## 2018-05-16 MED ORDER — PROPOFOL 1000 MG/100ML IV EMUL
INTRAVENOUS | Status: AC
  Administered 2018-05-16: 5 ug/kg/min via INTRAVENOUS
  Filled 2018-05-16: qty 100

## 2018-05-16 MED ORDER — ONDANSETRON HCL 4 MG PO TABS: 4.0000 mg | ORAL_TABLET | Freq: Four times a day (QID) | ORAL | Status: DC | PRN

## 2018-05-16 MED ORDER — MIDAZOLAM HCL 2 MG/2ML IJ SOLN
2.0000 mg | Freq: Once | INTRAMUSCULAR | Status: AC
  Administered 2018-05-16: 2 mg via INTRAVENOUS

## 2018-05-16 MED ORDER — LACTATED RINGERS IV SOLN
INTRAVENOUS | Status: DC
  Administered 2018-05-16: 05:00:00 via INTRAVENOUS

## 2018-05-16 MED ORDER — FENTANYL CITRATE (PF) 100 MCG/2ML IJ SOLN
50.0000 ug | INTRAMUSCULAR | Status: DC | PRN
  Administered 2018-05-16 – 2018-05-17 (×2): 50 ug via INTRAVENOUS
  Filled 2018-05-16: qty 2

## 2018-05-16 MED ORDER — ORAL CARE MOUTH RINSE: 15.0000 mL | OROMUCOSAL | Status: DC

## 2018-05-16 MED ORDER — FENTANYL 2500MCG IN NS 250ML (10MCG/ML) PREMIX INFUSION
0.0000 ug/h | INTRAVENOUS | Status: DC
  Administered 2018-05-16: 50 ug/h via INTRAVENOUS
  Filled 2018-05-16: qty 250

## 2018-05-16 MED ORDER — MIDAZOLAM HCL 2 MG/2ML IJ SOLN
INTRAMUSCULAR | Status: AC
  Filled 2018-05-16: qty 2

## 2018-05-16 MED ORDER — ENOXAPARIN SODIUM 40 MG/0.4ML ~~LOC~~ SOLN
40.0000 mg | SUBCUTANEOUS | Status: DC
  Administered 2018-05-16 – 2018-05-19 (×4): 40 mg via SUBCUTANEOUS
  Filled 2018-05-16 (×4): qty 0.4

## 2018-05-16 MED ORDER — SENNOSIDES 8.8 MG/5ML PO SYRP
5.0000 mL | ORAL_SOLUTION | Freq: Two times a day (BID) | ORAL | Status: DC | PRN
  Filled 2018-05-16: qty 5

## 2018-05-16 NOTE — Progress Notes (Addendum)
Patient ID: KAHLEN MORAIS, male   DOB: Jun 11, 1950, 68 y.o.   MRN: [id number]  [facility] PROGRESS NOTE  KEVION FATHEREE [id number] DOB: 1950/11/16 DOA: 05/16/2018 PCP: [name], MD  HPI/Subjective: Patient was just extubated and felt okay.  Some shortness of breath but better than when he came in.  Some chest pain secondary to chest compressions  Objective: Vitals:   05/16/18 1100 05/16/18 1200  BP: (!) 151/72 133/67  Pulse: 93 87  Resp: (!) 25   Temp:  98.4 F (36.9 C)  SpO2: 100% 100%    Filed Weights   05/16/18 0101  Weight: 100 kg    ROS: Review of Systems  Constitutional: Negative for chills and fever.  Eyes: Negative for blurred vision.  Respiratory: Positive for shortness of breath. Negative for cough.   Cardiovascular: Positive for chest pain.  Gastrointestinal: Negative for abdominal pain, constipation, diarrhea, nausea and vomiting.  Genitourinary: Negative for dysuria.  Musculoskeletal: Negative for joint pain.  Neurological: Negative for dizziness and headaches.   Exam: Physical Exam  Constitutional: He is oriented to person, place, and time.  HENT:  Nose: No mucosal edema.  Mouth/Throat: No oropharyngeal exudate or posterior oropharyngeal edema.  Eyes: Pupils are equal, round, and reactive to light. Conjunctivae, EOM and lids are normal.  Neck: No JVD present. Carotid bruit is not present. No edema present. No thyroid mass and no thyromegaly present.  Cardiovascular: S1 normal and S2 normal. Exam reveals no gallop.  No murmur heard. Pulses:      Dorsalis pedis pulses are 2+ on the right side and 2+ on the left side.  Respiratory: No respiratory distress. He has decreased breath sounds in the right lower field and the left lower field. He has no wheezes. He has no rhonchi. He has no rales.  GI: Soft. Bowel sounds are normal. There is no abdominal tenderness.  Musculoskeletal:     Right ankle: He exhibits no swelling.     Left ankle: He  exhibits no swelling.  Lymphadenopathy:    He has no cervical adenopathy.  Neurological: He is alert and oriented to person, place, and time. No cranial nerve deficit.  Skin: Skin is warm. No rash noted. Nails show no clubbing.  Psychiatric: He has a normal mood and affect.      Data Reviewed: Basic Metabolic Panel: Recent Labs  Lab 05/16/18 0111 05/16/18 0408  NA 131*  --   K 3.7  --   CL 96*  --   CO2 20*  --   GLUCOSE 448*  --   BUN 12  --   CREATININE 1.16  --   CALCIUM 8.3*  --   MG  --  2.0  PHOS  --  4.1   Liver Function Tests: Recent Labs  Lab 05/16/18 0111  AST 21  ALT 16  ALKPHOS 64  BILITOT 0.9  PROT 7.7  ALBUMIN 3.8   CBC: Recent Labs  Lab 05/16/18 0111  WBC 16.1*  NEUTROABS 8.7*  HGB 15.7  HCT 49.3  MCV 87.4  PLT 231   Cardiac Enzymes: Recent Labs  Lab 05/16/18 0111 05/16/18 0613  TROPONINI 0.03* 0.15*   CBG: Recent Labs  Lab 05/16/18 0745 05/16/18 0850 05/16/18 0951 05/16/18 1059 05/16/18 1157  GLUCAP 188* 116* 134* 193* 171*    Recent Results (from the past 240 hour(s))  Culture, blood (routine x 2)     Status: None (Preliminary result)   Collection Time: 05/16/18  1:11 AM  Result Value Ref Range Status   Specimen Description BLOOD RIGHT ANTECUBITAL  Final   Special Requests   Final    BOTTLES DRAWN AEROBIC AND ANAEROBIC Blood Culture results may not be optimal due to an excessive volume of blood received in culture bottles   Culture   Final    NO GROWTH < 12 HOURS Performed at [facility], [address] [place], [place] [address]    Report Status PENDING  Incomplete  Culture, blood (routine x 2)     Status: None (Preliminary result)   Collection Time: 05/16/18  1:11 AM  Result Value Ref Range Status   Specimen Description BLOOD LEFT ANTECUBITAL  Final   Special Requests   Final    BOTTLES DRAWN AEROBIC AND ANAEROBIC Blood Culture adequate volume   Culture   Final    NO GROWTH < 12 HOURS Performed at  [facility], [address], [place], [place] [address]    Report Status PENDING  Incomplete  MRSA PCR Screening     Status: None   Collection Time: 05/16/18  3:35 AM  Result Value Ref Range Status   MRSA by PCR NEGATIVE NEGATIVE Final    Comment:        The GeneXpert MRSA Assay (FDA approved for NASAL specimens only), is one component of a comprehensive MRSA colonization surveillance program. It is not intended to diagnose MRSA infection nor to guide or monitor treatment for MRSA infections. Performed at [facility], [address] [place], [place] [address]      Studies: Ct Angio Chest Pe W Or Wo Contrast  Result Date: 05/16/2018 CLINICAL DATA:  Recent respiratory arrest EXAM: CT ANGIOGRAPHY CHEST WITH CONTRAST TECHNIQUE: Multidetector CT imaging of the chest was performed using the standard protocol during bolus administration of intravenous contrast. Multiplanar CT image reconstructions and MIPs were obtained to evaluate the vascular anatomy. CONTRAST:  126m ISOVUE-370 IOPAMIDOL (ISOVUE-370) INJECTION 76% COMPARISON:  Chest x-ray from earlier in the same day. FINDINGS: Cardiovascular: Thoracic aorta demonstrates atherosclerotic calcification. Poor opacification is identified and evaluation is somewhat limited. No definitive aneurysmal dilatation is seen. Heart is mildly enlarged in size. The pulmonary artery shows a normal branching pattern without intraluminal filling defect to suggest pulmonary embolism. Mediastinum/Nodes: Thoracic inlet is within normal limits. No definitive esophageal abnormality is noted. Gastric catheter extends into the stomach. Endotracheal tube is noted in satisfactory position. Scattered mediastinal lymph nodes are identified not significant by size criteria and likely reactive in nature. Lungs/Pleura: Lungs demonstrate mild emphysematous changes as well as some interstitial thickening. Bibasilar atelectasis/early infiltrate is seen with  small pleural effusions. No focal confluent infiltrate is seen. No pneumothorax is noted. Upper Abdomen: Visualized upper abdomen shows no acute abnormality. Musculoskeletal: Mild degenerative changes of the thoracic spine are noted. No acute bony abnormality is noted. Review of the MIP images confirms the above findings. IMPRESSION: No evidence of pulmonary emboli. Changes of mild CHF with bilateral small effusions and interstitial edema. Mild bibasilar atelectasis/early infiltrate. Electronically Signed   By: MInez CatalinaM.D.   On: 05/16/2018 04:53   Dg Chest Port 1 View  Result Date: 05/16/2018 CLINICAL DATA:  Endotracheal tube placement. EXAM: PORTABLE CHEST 1 VIEW COMPARISON:  None available for comparison at time of study interpretation. FINDINGS: Endotracheal tube tip projects 3.3 cm above the carina. Nasogastric tube past proximal stomach, distal tip out of field of view. Multiple EKG lines overlie the patient and may obscure subtle underlying pathology. Pacer pad  overlies the heart. Mild cardiomegaly. Interstitial prominence without pleural effusion or focal consolidation. No pneumothorax. Soft tissue planes and included osseous structures are non suspicious. IMPRESSION: 1. Endotracheal tube tip projects 3.3 cm above the carina. Endotracheal tube tip past proximal stomach. 2. Mild cardiomegaly. Interstitial prominence most compatible pulmonary edema. Electronically Signed   By: Elon Alas M.D.   On: 05/16/2018 01:44    Scheduled Meds: . budesonide (PULMICORT) nebulizer solution  0.25 mg Nebulization Q12H  . chlorhexidine gluconate (MEDLINE KIT)  15 mL Mouth Rinse BID  . docusate sodium  100 mg Oral BID  . enoxaparin (LOVENOX) injection  40 mg Subcutaneous Q24H  . ipratropium-albuterol  3 mL Nebulization Q6H  . mouth rinse  15 mL Mouth Rinse 10 times per day  . methylPREDNISolone (SOLU-MEDROL) injection  60 mg Intravenous Q12H  . midazolam       Continuous Infusions: . famotidine  (PEPCID) IV 20 mg (05/16/18 1127)  . insulin 2.2 Units/hr (05/16/18 1202)  . lactated ringers 75 mL/hr at 05/16/18 0600  . levofloxacin (LEVAQUIN) IV    . propofol (DIPRIVAN) infusion 5 mcg/kg/min (05/16/18 1026)    Assessment/Plan:  1. Respiratory arrest that required CPR and intubation.  Patient extubated this morning. 2. Acute hypoxic respiratory failure.  Patient does not wear oxygen at home.  Currently on nasal cannula.  Continue to taper. 3. Pneumonia seen on CT scan.  Placed on Levaquin and steroids. 4. History of CAD and cardiomyopathy.  Echocardiogram pending.  Hopefully can get rid of IV fluid shortly.  If cardiology needed he does see Dr. [name]. 5. Type 2 diabetes mellitus.  Was on insulin drip.  Hopefully can convert off.  Code Status:     Code Status Orders  (From admission, onward)         Start     Ordered   05/16/18 0331  Full code  Continuous     05/16/18 0330        Code Status History    This patient has a current code status but no historical code status.     Family Communication: Permission to speak in front of family at the bedside Disposition Plan: To be determined.  Patient asked if he can go home today.  Consultants:  Critical care specialist  Procedures:  CPR  Intubation and extubation  Antibiotics:  Levaquin  Time spent: 35 minutes  [place]

## 2018-05-16 NOTE — ED Triage Notes (Signed)
Pt arrived via POV from triage where pt was apneic after pt got short of breath in the lobby and on the way to be triaged the patient stopped breathing. CPR in progress upon arrival to the room.

## 2018-05-16 NOTE — Progress Notes (Signed)
Patient extubated per MD request. Patient extubated with no complications and placed on 2l [place]. Will continue to monitor.

## 2018-05-16 NOTE — H&P (Signed)
Alexander Cobb is an 68 y.o. male.   Chief Complaint: Shortness of breath HPI: The patient with past medical history of CAD, ischemic cardiomyopathy, hypertension, diabetes and hyperlipidemia presents to the emergency department with shortness of breath.  The patient arrived and was clearly tachypneic and labored with his breathing.  He drove himself to the hospital but was found to have oxygen saturations in the 70s.  The patient was placed in a wheelchair and transported to an exam room but became apneic in route.  CPR was initiated and the patient was intubated.  Chest x-ray showed pulmonary edema consistent with CHF in the emergency department staff called the hospitalist service for further management.  Past Medical History:  Diagnosis Date  . CAD (coronary artery disease) 12/21/2010  . Cardiomyopathy   . Diabetes mellitus without complication ([place])   . Fluttering heart 12/21/2010  . HTN (hypertension) 12/21/2010  . Hyperlipidemia     Past Surgical History:  Procedure Laterality Date  . CARDIAC CATHETERIZATION    . COLONOSCOPY WITH PROPOFOL N/A 11/07/2015   Procedure: COLONOSCOPY WITH PROPOFOL;  Surgeon: Lollie Sails, MD;  Location: [facility] ENDOSCOPY;  Service: Endoscopy;  Laterality: N/A;    Family History  Problem Relation Age of Onset  . COPD Father    Social History:  reports that he has been smoking cigarettes. He has a 40.00 pack-year smoking history. He has never used smokeless tobacco. He reports that he does not drink alcohol or use drugs.  Allergies:  Allergies  Allergen Reactions  . Sulfa Drugs Cross Reactors Hives    Hives     Medications Prior to Admission  Medication Sig Dispense Refill  . amLODipine (NORVASC) 10 MG tablet Take 1 tablet (10 mg total) by mouth daily. 90 tablet 3  . aspirin 81 MG tablet Take 81 mg by mouth daily. Reported on 06/30/2015    . glucose blood (ONETOUCH VERIO) test strip Test sugars three times daily 100 each 12  . LANTUS SOLOSTAR  100 UNIT/ML Solostar Pen Reported on 06/30/2015  3  . lisinopril (PRINIVIL,ZESTRIL) 20 MG tablet Take 1 tablet (20 mg total) by mouth daily. 90 tablet 3  . metFORMIN (GLUCOPHAGE) 500 MG tablet Take 1 tablet (500 mg total) by mouth 2 (two) times daily with a meal.    . nitroGLYCERIN (NITROSTAT) 0.4 MG SL tablet Place 1 tablet (0.4 mg total) under the tongue every 5 (five) minutes as needed for chest pain. 25 tablet 6  . ONETOUCH DELICA LANCETS 54Y MISC Test sugars three times daily 100 each 11  . pravastatin (PRAVACHOL) 40 MG tablet Take 1 tablet (40 mg total) by mouth daily. 90 tablet 3  . varenicline (CHANTIX CONTINUING MONTH PAK) 1 MG tablet Take 1 tablet (1 mg total) by mouth 2 (two) times daily. 60 tablet 6    Results for orders placed or performed during the hospital encounter of 05/16/18 (from the past 48 hour(s))  Blood gas, arterial     Status: Abnormal (Preliminary result)   Collection Time: 05/16/18 12:50 AM  Result Value Ref Range   FIO2 0.40    Delivery systems VENTILATOR    Mode ASSIST CONTROL    VT 550 mL   LHR 16 resp/min   Peep/cpap 5.0 cm H20   pH, Arterial 7.14 (LL) 7.350 - 7.450    Comment: CRITICAL RESULT CALLED TO, READ BACK BY AND VERIFIED WITH:  COLE, RN AT 0128 ON [id number]    pCO2 arterial PENDING 32.0 - 48.0 mmHg  pO2, Arterial 62 (L) 83.0 - 108.0 mmHg   Bicarbonate 23.8 20.0 - 28.0 mmol/L   Acid-base deficit 6.5 (H) 0.0 - 2.0 mmol/L   O2 Saturation 82.7 %   Patient temperature 37.0    Collection site RIGHT RADIAL    Sample type ARTERIAL DRAW     Comment: Performed at [facility], [address] [place], [place] [address]  Glucose, capillary     Status: Abnormal   Collection Time: 05/16/18 12:52 AM  Result Value Ref Range   Glucose-Capillary 318 (H) 70 - 99 mg/dL  CBC with Differential     Status: Abnormal   Collection Time: 05/16/18  1:11 AM  Result Value Ref Range   WBC 16.1 (H) 4.0 - 10.5 K/uL   RBC 5.64 4.22 - 5.81 MIL/uL   Hemoglobin  15.7 13.0 - 17.0 g/dL   HCT 49.3 39.0 - 52.0 %   MCV 87.4 80.0 - 100.0 fL   MCH 27.8 26.0 - 34.0 pg   MCHC 31.8 30.0 - 36.0 g/dL   RDW 12.1 11.5 - 15.5 %   Platelets 231 150 - 400 K/uL   nRBC 0.0 0.0 - 0.2 %   Neutrophils Relative % 53 %   Neutro Abs 8.7 (H) 1.7 - 7.7 K/uL   Lymphocytes Relative 35 %   Lymphs Abs 5.6 (H) 0.7 - 4.0 K/uL   Monocytes Relative 8 %   Monocytes Absolute 1.2 (H) 0.1 - 1.0 K/uL   Eosinophils Relative 2 %   Eosinophils Absolute 0.3 0.0 - 0.5 K/uL   Basophils Relative 1 %   Basophils Absolute 0.2 (H) 0.0 - 0.1 K/uL   Immature Granulocytes 1 %   Abs Immature Granulocytes 0.17 (H) 0.00 - 0.07 K/uL    Comment: Performed at [facility], [address] [place], [place] [address]  Comprehensive metabolic panel     Status: Abnormal   Collection Time: 05/16/18  1:11 AM  Result Value Ref Range   Sodium 131 (L) 135 - 145 mmol/L   Potassium 3.7 3.5 - 5.1 mmol/L    Comment: HEMOLYSIS AT THIS LEVEL MAY AFFECT RESULT   Chloride 96 (L) 98 - 111 mmol/L   CO2 20 (L) 22 - 32 mmol/L   Glucose, Bld 448 (H) 70 - 99 mg/dL   BUN 12 8 - 23 mg/dL   Creatinine, Ser 1.16 0.61 - 1.24 mg/dL   Calcium 8.3 (L) 8.9 - 10.3 mg/dL   Total Protein 7.7 6.5 - 8.1 g/dL   Albumin 3.8 3.5 - 5.0 g/dL   AST 21 15 - 41 U/L   ALT 16 0 - 44 U/L   Alkaline Phosphatase 64 38 - 126 U/L   Total Bilirubin 0.9 0.3 - 1.2 mg/dL   GFR calc non Af Amer >60 >60 mL/min   GFR calc Af Amer >60 >60 mL/min   Anion gap 15 5 - 15    Comment: Performed at [facility], [address] [place], [place] [address]  Troponin I - Once     Status: Abnormal   Collection Time: 05/16/18  1:11 AM  Result Value Ref Range   Troponin I 0.03 (HH) <0.03 ng/mL    Comment: Performed at [facility], [address] [place], [place] [address]  Urinalysis, Complete w Microscopic     Status: Abnormal   Collection Time: 05/16/18  1:11 AM  Result Value Ref Range   Color, Urine STRAW (A) YELLOW    APPearance CLEAR (A)  CLEAR   Specific Gravity, Urine 1.018 1.005 - 1.030   pH 6.0 5.0 - 8.0   Glucose, UA >=500 (A) NEGATIVE mg/dL   Hgb urine dipstick SMALL (A) NEGATIVE   Bilirubin Urine NEGATIVE NEGATIVE   Ketones, ur NEGATIVE NEGATIVE mg/dL   Protein, ur 100 (A) NEGATIVE mg/dL   Nitrite NEGATIVE NEGATIVE   Leukocytes,Ua NEGATIVE NEGATIVE   RBC / HPF 6-10 0 - 5 RBC/hpf   WBC, UA 0-5 0 - 5 WBC/hpf   Bacteria, UA NONE SEEN NONE SEEN   Squamous Epithelial / LPF 0-5 0 - 5    Comment: Performed at [facility], [address], [place], [place] [address]  Urine Drug Screen, Qualitative     Status: None   Collection Time: 05/16/18  1:11 AM  Result Value Ref Range   Tricyclic, Ur Screen NONE DETECTED NONE DETECTED   Amphetamines, Ur Screen NONE DETECTED NONE DETECTED   MDMA (Ecstasy)Ur Screen NONE DETECTED NONE DETECTED   Cocaine Metabolite,Ur [place] NONE DETECTED NONE DETECTED   Opiate, Ur Screen NONE DETECTED NONE DETECTED   Phencyclidine (PCP) Ur S NONE DETECTED NONE DETECTED   Cannabinoid 50 Ng, Ur [place] NONE DETECTED NONE DETECTED   Barbiturates, Ur Screen NONE DETECTED NONE DETECTED   Benzodiazepine, Ur Scrn NONE DETECTED NONE DETECTED   Methadone Scn, Ur NONE DETECTED NONE DETECTED    Comment: (NOTE) Tricyclics + metabolites, urine    Cutoff 1000 ng/mL Amphetamines + metabolites, urine  Cutoff 1000 ng/mL MDMA (Ecstasy), urine              Cutoff 500 ng/mL Cocaine Metabolite, urine          Cutoff 300 ng/mL Opiate + metabolites, urine        Cutoff 300 ng/mL Phencyclidine (PCP), urine         Cutoff 25 ng/mL Cannabinoid, urine                 Cutoff 50 ng/mL Barbiturates + metabolites, urine  Cutoff 200 ng/mL Benzodiazepine, urine              Cutoff 200 ng/mL Methadone, urine                   Cutoff 300 ng/mL The urine drug screen provides only a preliminary, unconfirmed analytical test result and should not be used for non-medical purposes. Clinical consideration and  professional judgment should be applied to any positive drug screen result due to possible interfering substances. A more specific alternate chemical method must be used in order to obtain a confirmed analytical result. Gas chromatography / mass spectrometry (GC/MS) is the preferred confirmat ory method. Performed at [facility], [address] [place], [place] [address]   Lactic acid, plasma     Status: Abnormal   Collection Time: 05/16/18  1:11 AM  Result Value Ref Range   Lactic Acid, Venous 5.5 (HH) 0.5 - 1.9 mmol/L    Comment: CRITICAL RESULT CALLED TO, READ BACK BY AND VERIFIED WITH COLE AMORIELLO AT 0149 05/16/2018.  TFK Performed at [facility], [address] [place], [place] [address]   Influenza panel by PCR (type A & B)     Status: None   Collection Time: 05/16/18  1:11 AM  Result Value Ref Range   Influenza A By PCR NEGATIVE NEGATIVE   Influenza B By PCR NEGATIVE NEGATIVE    Comment: (NOTE) The Xpert Xpress Flu assay is intended as an aid in the  diagnosis of  influenza and should not be used as a sole basis for treatment.  This  assay is FDA approved for nasopharyngeal swab specimens only. Nasal  washings and aspirates are unacceptable for Xpert Xpress Flu testing. Performed at [facility], [address] [place], [place] [address]   Lactic acid, plasma     Status: None   Collection Time: 05/16/18  2:52 AM  Result Value Ref Range   Lactic Acid, Venous 1.6 0.5 - 1.9 mmol/L    Comment: Performed at [facility], [address] [place], [place] [address]  Ethanol     Status: None   Collection Time: 05/16/18  3:00 AM  Result Value Ref Range   Alcohol, Ethyl (B) <10 <10 mg/dL    Comment: (NOTE) Lowest detectable limit for serum alcohol is 10 mg/dL. For medical purposes only. Performed at [facility], [address] [place], [place] [address]   Glucose, capillary     Status: Abnormal   Collection Time:  05/16/18  3:25 AM  Result Value Ref Range   Glucose-Capillary 418 (H) 70 - 99 mg/dL  MRSA PCR Screening     Status: None   Collection Time: 05/16/18  3:35 AM  Result Value Ref Range   MRSA by PCR NEGATIVE NEGATIVE    Comment:        The GeneXpert MRSA Assay (FDA approved for NASAL specimens only), is one component of a comprehensive MRSA colonization surveillance program. It is not intended to diagnose MRSA infection nor to guide or monitor treatment for MRSA infections. Performed at [facility], [address] [place], [place] [address]   Blood gas, arterial     Status: None   Collection Time: 05/16/18  3:45 AM  Result Value Ref Range   FIO2 0.50    Delivery systems VENTILATOR    Mode PRESSURE REGULATED VOLUME CONTROL    VT 580 mL   LHR 22 resp/min   Peep/cpap 5.0 cm H20   pH, Arterial 7.37 7.350 - 7.450   pCO2 arterial 42 32.0 - 48.0 mmHg   pO2, Arterial 89 83.0 - 108.0 mmHg   Bicarbonate 24.3 20.0 - 28.0 mmol/L   Acid-base deficit 1.1 0.0 - 2.0 mmol/L   O2 Saturation 96.6 %   Patient temperature 37.0    Collection site RIGHT RADIAL    Sample type ARTERIAL DRAW    Allens test (pass/fail) PASS PASS    Comment: Performed at [facility], [address] [place], [place] [address]  TSH     Status: Abnormal   Collection Time: 05/16/18  4:08 AM  Result Value Ref Range   TSH 0.012 (L) 0.350 - 4.500 uIU/mL    Comment: Performed by a 3rd Generation assay with a functional sensitivity of <=0.01 uIU/mL. Performed at [facility], [address] [place], [place] [address]   Triglycerides     Status: None   Collection Time: 05/16/18  4:08 AM  Result Value Ref Range   Triglycerides 76 <150 mg/dL    Comment: Performed at [facility], [address] [place], [place] [address]  Magnesium     Status: None   Collection Time: 05/16/18  4:08 AM  Result Value Ref Range   Magnesium 2.0 1.7 - 2.4 mg/dL    Comment: Performed at [facility], [address], [place], [place] [address]  Phosphorus     Status: None   Collection Time: 05/16/18  4:08 AM  Result Value Ref Range   Phosphorus 4.1 2.5 - 4.6 mg/dL    Comment: Performed at [facility], [place], [place] [address]   Ct Angio Chest Pe W Or Wo Contrast  Result Date: 05/16/2018 CLINICAL DATA:  Recent respiratory arrest EXAM: CT ANGIOGRAPHY CHEST WITH CONTRAST TECHNIQUE: Multidetector CT imaging of the chest was performed using the standard protocol during bolus administration of intravenous contrast. Multiplanar CT image reconstructions and MIPs were obtained to evaluate the vascular anatomy. CONTRAST:  138mL ISOVUE-370 IOPAMIDOL (ISOVUE-370) INJECTION 76% COMPARISON:  Chest x-ray from earlier in the same day. FINDINGS: Cardiovascular: Thoracic aorta demonstrates atherosclerotic calcification. Poor opacification is identified and evaluation is somewhat limited. No definitive aneurysmal dilatation is seen. Heart is mildly enlarged in size. The pulmonary artery shows a normal branching pattern without intraluminal filling defect to suggest pulmonary embolism. Mediastinum/Nodes: Thoracic inlet is within normal limits. No definitive esophageal abnormality is noted. Gastric catheter extends into the stomach. Endotracheal tube is noted in satisfactory position. Scattered mediastinal lymph nodes are identified not significant by size criteria and likely reactive in nature. Lungs/Pleura: Lungs demonstrate mild emphysematous changes as well as some interstitial thickening. Bibasilar atelectasis/early infiltrate is seen with small pleural effusions. No focal confluent infiltrate is seen. No pneumothorax is noted. Upper Abdomen: Visualized upper abdomen shows no acute abnormality. Musculoskeletal: Mild degenerative changes of the thoracic spine are noted. No acute bony abnormality is noted. Review of the MIP images confirms the above findings. IMPRESSION: No  evidence of pulmonary emboli. Changes of mild CHF with bilateral small effusions and interstitial edema. Mild bibasilar atelectasis/early infiltrate. Electronically Signed   By: Inez Catalina M.D.   On: 05/16/2018 04:53   Dg Chest Port 1 View  Result Date: 05/16/2018 CLINICAL DATA:  Endotracheal tube placement. EXAM: PORTABLE CHEST 1 VIEW COMPARISON:  None available for comparison at time of study interpretation. FINDINGS: Endotracheal tube tip projects 3.3 cm above the carina. Nasogastric tube past proximal stomach, distal tip out of field of view. Multiple EKG lines overlie the patient and may obscure subtle underlying pathology. Pacer pad overlies the heart. Mild cardiomegaly. Interstitial prominence without pleural effusion or focal consolidation. No pneumothorax. Soft tissue planes and included osseous structures are non suspicious. IMPRESSION: 1. Endotracheal tube tip projects 3.3 cm above the carina. Endotracheal tube tip past proximal stomach. 2. Mild cardiomegaly. Interstitial prominence most compatible pulmonary edema. Electronically Signed   By: Elon Alas M.D.   On: 05/16/2018 01:44    Review of Systems  Unable to perform ROS: Intubated    Blood pressure 108/65, pulse 66, temperature 98.1 F (36.7 C), temperature source Axillary, resp. rate (!) 22, height 6' (1.829 m), weight 100 kg, SpO2 100 %. Physical Exam  Vitals reviewed. Constitutional: He is oriented to person, place, and time. He appears well-developed and well-nourished. No distress. He is intubated.  HENT:  Head: Normocephalic and atraumatic.  Mouth/Throat: Oropharynx is clear and moist.  Eyes: Pupils are equal, round, and reactive to light. Conjunctivae and EOM are normal. No scleral icterus.  Neck: Normal range of motion. Neck supple. No JVD present. No tracheal deviation present. No thyromegaly present.  Cardiovascular: Normal rate and regular rhythm. Exam reveals no gallop and no friction rub.  No murmur  heard. Respiratory: Effort normal and breath sounds normal. He is intubated. No respiratory distress.  GI: Soft. Bowel sounds are normal. He exhibits no distension. There is no abdominal tenderness.  Genitourinary:    Genitourinary Comments: Deferred  Musculoskeletal: Normal range of motion.        General: No edema.  Lymphadenopathy:    He has no cervical adenopathy.  Neurological: He is alert and oriented to person, place, and time. No cranial nerve deficit.  Skin: Skin is warm and dry. No rash noted. No erythema.  Psychiatric: He has a normal mood and affect. His behavior is normal. Judgment and thought content normal.     Assessment/Plan This is a 68 year old male admitted for respiratory failure. 1.  Respiratory failure: Acute; hypoxemia.  Continue mechanical ventilation.  Wean oxygen as tolerated.  Diuresis as needed.  Obtain echocardiogram to evaluate pump function. 2.  Hypertension: Initially uncontrolled following intubation.  Now normalized but trending downward.  Continue to monitor blood pressure.  Initiate pressor therapy if needed. 3.  CAD: Elevation in troponin following CPR.  Continue to follow cardiac biomarkers.  Monitor telemetry.  Continue aspirin 4.  Diabetes mellitus type 2: Hold metformin.  Sliding scale insulin while hospitalized.  Clarify basal insulin dosing and adjust for n.p.o. status. 5.  Hyperlipidemia: Continue statin therapy 6.  DVT prophylaxis: [place] 7.  GI prophylaxis: PPI following 24 hours intubation The patient is a full code.  I have personally spent 45 minutes in critical care time with this patient.  Harrie Foreman, MD 05/16/2018, 5:26 AM

## 2018-05-16 NOTE — ED Provider Notes (Signed)
[facility] Emergency Department Provider Note   ____________________________________________   First MD Initiated Contact with Patient 05/16/18 [phone]     (approximate)  I have reviewed the triage vital signs and the nursing notes.   HISTORY  Chief Complaint Respiratory Distress  Level V caveat: unresponsive, not breathing  HPI Alexander Cobb is a 68 y.o. male who reportedly drove himself to the ED.  Lobby officer helped the patient into a wheelchair when he collapsed.  Arrives to the treatment room cyanotic without pulses, not breathing.  Unknown history.  Unknown chief complaint.       Past Medical History:  Diagnosis Date  . CAD (coronary artery disease) 12/21/2010  . Cardiomyopathy   . Diabetes mellitus without complication ([place])   . Fluttering heart 12/21/2010  . HTN (hypertension) 12/21/2010  . Hyperlipidemia     Patient Active Problem List   Diagnosis Date Noted  . Acute respiratory failure with hypoxemia ([place]) 05/16/2018  . Poorly controlled type 2 diabetes mellitus ([place]) 12/28/2015  . History of hyperthyroidism 07/19/2014  . Chest pain 04/14/2012  . Congestive dilated cardiomyopathy ([place]) 12/21/2010  . Fluttering heart 12/21/2010  . CAD (coronary artery disease) 12/21/2010  . Smoking 12/21/2010  . Hyperlipidemia 12/21/2010  . HTN (hypertension) 12/21/2010    Past Surgical History:  Procedure Laterality Date  . CARDIAC CATHETERIZATION    . COLONOSCOPY WITH PROPOFOL N/A 11/07/2015   Procedure: COLONOSCOPY WITH PROPOFOL;  Surgeon: Lollie Sails, MD;  Location: [facility] ENDOSCOPY;  Service: Endoscopy;  Laterality: N/A;    Prior to Admission medications   Medication Sig Start Date End Date Taking? Authorizing Provider  amLODipine (NORVASC) 10 MG tablet Take 1 tablet (10 mg total) by mouth daily. 06/30/15   Minna Merritts, MD  aspirin 81 MG tablet Take 81 mg by mouth daily. Reported on 06/30/2015    [provider]  glucose  blood (ONETOUCH VERIO) test strip Test sugars three times daily 07/19/14   Haydee Monica, MD  LANTUS SOLOSTAR 100 UNIT/ML Solostar Pen Reported on 06/30/2015 09/14/14   [provider]  lisinopril (PRINIVIL,ZESTRIL) 20 MG tablet Take 1 tablet (20 mg total) by mouth daily. 12/28/15 12/27/16  Minna Merritts, MD  metFORMIN (GLUCOPHAGE) 500 MG tablet Take 1 tablet (500 mg total) by mouth 2 (two) times daily with a meal. 12/28/15   Gollan, Kathlene November, MD  nitroGLYCERIN (NITROSTAT) 0.4 MG SL tablet Place 1 tablet (0.4 mg total) under the tongue every 5 (five) minutes as needed for chest pain. 08/09/13   Minna Merritts, MD  ONETOUCH DELICA LANCETS 48N MISC Test sugars three times daily 07/19/14   Phadke, Radhika P, MD  pravastatin (PRAVACHOL) 40 MG tablet Take 1 tablet (40 mg total) by mouth daily. 06/30/15   Minna Merritts, MD  varenicline (CHANTIX CONTINUING MONTH PAK) 1 MG tablet Take 1 tablet (1 mg total) by mouth 2 (two) times daily. 06/30/15   Minna Merritts, MD    Allergies Sulfa drugs cross reactors  Family History  Problem Relation Age of Onset  . COPD Father     Social History Social History   Tobacco Use  . Smoking status: Current Every Day Smoker    Packs/day: 1.00    Years: 40.00    Pack years: 40.00    Types: Cigarettes  . Smokeless tobacco: Never Used  Substance Use Topics  . Alcohol use: No  . Drug use: No    Review of Systems  Constitutional: No fever/chills Eyes: No visual changes. ENT: No sore throat. Cardiovascular: Denies chest pain. Respiratory: Denies shortness of breath. Gastrointestinal: No abdominal pain.  No nausea, no vomiting.  No diarrhea.  No constipation. Genitourinary: Negative for dysuria. Musculoskeletal: Negative for back pain. Skin: Negative for rash. Neurological: Negative for headaches, focal weakness or numbness. 10 point ROS unobtainable secondary to patient  unresponsive  ____________________________________________   PHYSICAL EXAM:  VITAL SIGNS: ED Triage Vitals  Enc Vitals Group     BP      Pulse      Resp      Temp      Temp src      SpO2      Weight      Height      Head Circumference      Peak Flow      Pain Score      Pain Loc      Pain Edu?      Excl. in [place]?     Constitutional: Unresponsive and cyanotic. Eyes: Conjunctivae are normal. PERRL. EOMI. Head: Atraumatic. Nose: Atraumatic. Mouth/Throat: Mucous membranes are moist.  Oropharynx non-erythematous. Neck: No stridor.   Cardiovascular: Pulseless. Respiratory: Apneic. Gastrointestinal: Soft. No distention. No abdominal bruits. No CVA tenderness. Musculoskeletal: No lower extremity tenderness nor edema.  No joint effusions. Neurologic:  Unresponsive. Skin:  Skin is warm, dry and intact. No rash noted. Psychiatric: Unable.  ____________________________________________   LABS (all labs ordered are listed, but only abnormal results are displayed)  Labs Reviewed  CBC WITH DIFFERENTIAL/PLATELET - Abnormal; Notable for the following components:      Result Value   WBC 16.1 (*)    Neutro Abs 8.7 (*)    Lymphs Abs 5.6 (*)    Monocytes Absolute 1.2 (*)    Basophils Absolute 0.2 (*)    Abs Immature Granulocytes 0.17 (*)    All other components within normal limits  COMPREHENSIVE METABOLIC PANEL - Abnormal; Notable for the following components:   Sodium 131 (*)    Chloride 96 (*)    CO2 20 (*)    Glucose, Bld 448 (*)    Calcium 8.3 (*)    All other components within normal limits  TROPONIN I - Abnormal; Notable for the following components:   Troponin I 0.03 (*)    All other components within normal limits  BLOOD GAS, ARTERIAL - Abnormal; Notable for the following components:   pH, Arterial 7.14 (*)    pO2, Arterial 62 (*)    Acid-base deficit 6.5 (*)    All other components within normal limits  URINALYSIS, COMPLETE (UACMP) WITH MICROSCOPIC - Abnormal;  Notable for the following components:   Color, Urine STRAW (*)    APPearance CLEAR (*)    Glucose, UA >=500 (*)    Hgb urine dipstick SMALL (*)    Protein, ur 100 (*)    All other components within normal limits  LACTIC ACID, PLASMA - Abnormal; Notable for the following components:   Lactic Acid, Venous 5.5 (*)    All other components within normal limits  GLUCOSE, CAPILLARY - Abnormal; Notable for the following components:   Glucose-Capillary 318 (*)    All other components within normal limits  GLUCOSE, CAPILLARY - Abnormal; Notable for the following components:   Glucose-Capillary 418 (*)    All other components within normal limits  CULTURE, BLOOD (ROUTINE X 2)  CULTURE, BLOOD (ROUTINE X 2)  MRSA PCR SCREENING  URINE DRUG SCREEN, QUALITATIVE ([facility]  ONLY)  LACTIC ACID, PLASMA  INFLUENZA PANEL BY PCR (TYPE A & B)  ETHANOL  TSH  HEMOGLOBIN A1C  TRIGLYCERIDES  MAGNESIUM  PHOSPHORUS  BLOOD GAS, ARTERIAL   ____________________________________________  EKG  ED ECG REPORT I, Nancye Grumbine J, the attending physician, personally viewed and interpreted this ECG.   Date: 05/16/2018  EKG Time: 0051  Rate: 140  Rhythm: sinus tachycardia  Axis: RAD  Intervals:none  ST&T Change: Nonspecific  ____________________________________________  RADIOLOGY  ED MD interpretation: No pneumonia, pulmonary edema  Official radiology report(s): Dg Chest Port 1 View  Result Date: 05/16/2018 CLINICAL DATA:  Endotracheal tube placement. EXAM: PORTABLE CHEST 1 VIEW COMPARISON:  None available for comparison at time of study interpretation. FINDINGS: Endotracheal tube tip projects 3.3 cm above the carina. Nasogastric tube past proximal stomach, distal tip out of field of view. Multiple EKG lines overlie the patient and may obscure subtle underlying pathology. Pacer pad overlies the heart. Mild cardiomegaly. Interstitial prominence without pleural effusion or focal consolidation. No pneumothorax. Soft  tissue planes and included osseous structures are non suspicious. IMPRESSION: 1. Endotracheal tube tip projects 3.3 cm above the carina. Endotracheal tube tip past proximal stomach. 2. Mild cardiomegaly. Interstitial prominence most compatible pulmonary edema. Electronically Signed   By: Elon Alas M.D.   On: 05/16/2018 01:44    ____________________________________________   PROCEDURES  Procedure(s) performed (including Critical Care):  Procedure Name: Intubation Date/Time: 05/16/2018 1:37 AM Performed by: Gregor Hams, MD Pre-anesthesia Checklist: Patient identified, Patient being monitored, Emergency Drugs available, Timeout performed and Suction available Oxygen Delivery Method: Ambu bag Preoxygenation: Pre-oxygenation with 100% oxygen Induction Type: Rapid sequence Ventilation: Mask ventilation without difficulty Laryngoscope Size: Glidescope and 3 Grade View: Grade I Number of attempts: 1 Airway Equipment and Method: Rigid stylet Placement Confirmation: ETT inserted through vocal cords under direct vision,  CO2 detector and Breath sounds checked- equal and bilateral          CRITICAL CARE Performed by: Paulette Blanch   Total critical care time: 60 minutes  Critical care time was exclusive of separately billable procedures and treating other patients.  Critical care was necessary to treat or prevent imminent or life-threatening deterioration.  Critical care was time spent personally by me on the following activities: development of treatment plan with patient and/or surrogate as well as nursing, discussions with consultants, evaluation of patient's response to treatment, examination of patient, obtaining history from patient or surrogate, ordering and performing treatments and interventions, ordering and review of laboratory studies, ordering and review of radiographic studies, pulse oximetry and re-evaluation of patient's  condition. ____________________________________________   INITIAL IMPRESSION / ASSESSMENT AND PLAN / ED COURSE  As part of my medical decision making, I reviewed the following data within the [place] notes reviewed and incorporated, Labs reviewed, EKG interpreted, Old EKG reviewed, Old chart reviewed, Radiograph reviewed, Discussed with admitting physician and Notes from prior ED visits        68 year old male with diabetes, CAD, cardiomyopathy who walked into ED lobby and collapsed.  Arrives to the treatment room apneic, cyanotic and pulseless.  CPR initiated immediately. Triage nurse noted SOB and patient hypoxic to 70%s. Patient collapsed shortly thereafter. Drove himself so no family at bedside. Will attempt to reach.    Clinical Course as of May 16 343  Sat May 16, 2018  0315 Spoke with patient's emergency contact Noralee Chars via phone at [phone].  States she had just seen him around 8 PM and he  was complaining of not feeling well.  Updated her of patient's critical status, intubation and admission to CCU.   [JS]  0345 Verified with patient's emergency contact that he has not been traveling out of the country within the last 14 days.   [JS]    Clinical Course User Index [JS] Paulette Blanch, MD     ____________________________________________   FINAL CLINICAL IMPRESSION(S) / ED DIAGNOSES  Final diagnoses:  Acute respiratory failure with hypoxia ([place])  Acute pulmonary edema ([place])  Hypoxia     ED Discharge Orders    None       Note:  This document was prepared using Dragon voice recognition software and may include unintentional dictation errors.   Paulette Blanch, MD 05/16/18 [phone]

## 2018-05-16 NOTE — ED Notes (Signed)
50 mg bolus propofol

## 2018-05-16 NOTE — ED Notes (Signed)
Pt walked into the lobby and per [employer] officer [place] pt almost fell so he placed him in a wheelchair. Pt was noted to be having a difficult time breathing taken to RM 26. Pt became apneic on the way to the room.

## 2018-05-16 NOTE — Progress Notes (Signed)
PHARMACY - PHYSICIAN COMMUNICATION CRITICAL VALUE ALERT - BLOOD CULTURE IDENTIFICATION (BCID)  Alexander Cobb is an 68 y.o. male who presented to [facility] on 05/16/2018 with a chief complaint of SOb  Assessment:  4/4 bottles positive for MSSA. (include suspected source if known)  Name of physician (or Provider) Contacted: Dr. Leslye Peer and Dr. Duwayne Heck   Current antibiotics: levofloxacin   Changes to prescribed antibiotics recommended: Add ancef 2 g q8H.  Recommendations accepted by provider  Results for orders placed or performed during the hospital encounter of 05/16/18  Blood Culture ID Panel (Reflexed) (Collected: 05/16/2018  1:11 AM)  Result Value Ref Range   Enterococcus species NOT DETECTED NOT DETECTED   Listeria monocytogenes NOT DETECTED NOT DETECTED   Staphylococcus species DETECTED (A) NOT DETECTED   Staphylococcus aureus (BCID) DETECTED (A) NOT DETECTED   Methicillin resistance NOT DETECTED NOT DETECTED   Streptococcus species NOT DETECTED NOT DETECTED   Streptococcus agalactiae NOT DETECTED NOT DETECTED   Streptococcus pneumoniae NOT DETECTED NOT DETECTED   Streptococcus pyogenes NOT DETECTED NOT DETECTED   Acinetobacter baumannii NOT DETECTED NOT DETECTED   Enterobacteriaceae species NOT DETECTED NOT DETECTED   Enterobacter cloacae complex NOT DETECTED NOT DETECTED   Escherichia coli NOT DETECTED NOT DETECTED   Klebsiella oxytoca NOT DETECTED NOT DETECTED   Klebsiella pneumoniae NOT DETECTED NOT DETECTED   Proteus species NOT DETECTED NOT DETECTED   Serratia marcescens NOT DETECTED NOT DETECTED   Haemophilus influenzae NOT DETECTED NOT DETECTED   Neisseria meningitidis NOT DETECTED NOT DETECTED   Pseudomonas aeruginosa NOT DETECTED NOT DETECTED   Candida albicans NOT DETECTED NOT DETECTED   Candida glabrata NOT DETECTED NOT DETECTED   Candida krusei NOT DETECTED NOT DETECTED   Candida parapsilosis NOT DETECTED NOT DETECTED   Candida tropicalis NOT DETECTED NOT  DETECTED    [name] 05/16/2018  4:42 PM

## 2018-05-16 NOTE — ED Notes (Signed)
Pt fully awake and looking at staff. Given verbal order for 2 mg Versed IV push and 50 mg bolus of propofol.

## 2018-05-16 NOTE — ED Notes (Signed)
Critical troponin of 0.03 called from lab. Dr. Rockne Menghini notified, no new verbal orders received.

## 2018-05-16 NOTE — Consult Note (Signed)
PULMONARY / CRITICAL CARE MEDICINE  Name: Alexander Cobb MRN: [id number] DOB: January 19, 1951    LOS: 0  Referring Provider: Dr. Marcille Blanco Reason for Referral: Acute hypoxic and hypercarbic respiratory failure Brief patient description: 68 year old male who presented to the ED with acute dyspnea and hypoxemia, progressed into respiratory arrest, had 1 cycle of CPR and now intubated and sedated  HPI: This is a 68 year old male presented to the ED via personal vehicle with acute dyspnea.  While in the ED lobby, he almost fell and was caught by [employer], placed in a wheelchair and taken for evaluation.  Upon arrival in the room, patient became apneic and had 1 cycle of CPR performed with return of spontaneous circulation.  His ED labs showed a blood glucose level of 448 mg/dL and a lactic acid of 5.5.  He was emergently intubated and transferred to the ICU for further management.  His CT chest angiogram was negative for PE but showed small bilateral pleural effusion and mild pulmonary edema.  He is a current everyday smoker.  Past Medical History:  Diagnosis Date  . CAD (coronary artery disease) 12/21/2010  . Cardiomyopathy   . Diabetes mellitus without complication ([place])   . Fluttering heart 12/21/2010  . HTN (hypertension) 12/21/2010  . Hyperlipidemia    Past Surgical History:  Procedure Laterality Date  . CARDIAC CATHETERIZATION    . COLONOSCOPY WITH PROPOFOL N/A 11/07/2015   Procedure: COLONOSCOPY WITH PROPOFOL;  Surgeon: Lollie Sails, MD;  Location: [facility] ENDOSCOPY;  Service: Endoscopy;  Laterality: N/A;   Prior to Admission medications   Medication Sig Start Date End Date Taking? Authorizing Provider  amLODipine (NORVASC) 5 MG tablet Take 5 mg by mouth daily.   Yes [provider]  clopidogrel (PLAVIX) 75 MG tablet Take 75 mg by mouth daily.   Yes [provider]  donepezil (ARICEPT) 5 MG tablet Take 1 tablet (5 mg total) by mouth at bedtime. 11/03/17  12/13/17 Yes Sowles, Drue Stager, MD  empagliflozin (JARDIANCE) 25 MG TABS tablet Take 25 mg by mouth daily.   Yes [provider]  glycopyrrolate (ROBINUL) 1 MG tablet Take 1 mg by mouth 2 (two) times daily.   Yes [provider]  insulin aspart (NOVOLOG FLEXPEN) 100 UNIT/ML FlexPen Inject 12 Units into the skin 2 (two) times daily.   Yes [provider]  insulin aspart (NOVOLOG) 100 UNIT/ML FlexPen Inject 18 Units into the skin daily. At 1700   Yes [provider]  Insulin Degludec-Liraglutide (XULTOPHY) 100-3.6 UNIT-MG/ML SOPN Inject 50 Units into the skin daily.   Yes [provider]  levETIRAcetam (KEPPRA) 500 MG tablet Take 500 mg by mouth 2 (two) times daily.   Yes [provider]  lipase/protease/amylase (CREON) 12000 units CPEP capsule Take 6,000 Units by mouth 3 (three) times daily before meals.   Yes [provider]  lipase/protease/amylase (CREON) 12000 units CPEP capsule Take 3,000 Units by mouth at bedtime. With snack   Yes [provider]  lisinopril (PRINIVIL,ZESTRIL) 5 MG tablet Take 5 mg by mouth daily.   Yes [provider]  metoprolol succinate (TOPROL-XL) 25 MG 24 hr tablet Take 1 tablet (25 mg total) by mouth daily. 11/03/17  Yes Sowles, Drue Stager, MD  rosuvastatin (CRESTOR) 40 MG tablet Take 1 tablet (40 mg total) by mouth daily. 11/03/17 12/13/17 Yes Steele Sizer, MD  aspirin EC 81 MG tablet Take 81 mg by mouth daily.    [provider]  famotidine (PEPCID) 20 MG  tablet Take 1 tablet (20 mg total) by mouth 2 (two) times daily. 11/03/17 12/03/17  Steele Sizer, MD  gabapentin (NEURONTIN) 300 MG capsule Take 1 capsule (300 mg total) by mouth 2 (two) times daily. 11/03/17 12/03/17  Steele Sizer, MD  insulin glargine (LANTUS) 100 UNIT/ML injection Inject 0.1 mLs (10 Units total) into the skin daily. 11/03/17 12/03/17  Steele Sizer, MD  lacosamide 100 MG TABS Take 1 tablet (100 mg total) by mouth 2  (two) times daily. Patient not taking: Reported on 12/13/2017 03/21/17   Fritzi Mandes, MD  promethazine (PHENERGAN) 12.5 MG tablet Take 1 tablet (12.5 mg total) by mouth every 6 (six) hours as needed for nausea or vomiting. Patient not taking: Reported on 12/13/2017 01/07/17   Stark Klein, MD  sertraline (ZOLOFT) 25 MG tablet Take 1 tablet (25 mg total) by mouth daily. Patient not taking: Reported on 12/13/2017 11/03/17   Steele Sizer, MD   Allergies Allergies  Allergen Reactions  . Sulfa Drugs Cross Reactors Hives    Hives     Family History Family History  Problem Relation Age of Onset  . COPD Father    Social History  reports that he has been smoking cigarettes. He has a 40.00 pack-year smoking history. He has never used smokeless tobacco. He reports that he does not drink alcohol or use drugs.  Review Of Systems: Unable to obtain as patient is currently intubated and sedated  VITAL SIGNS: BP 108/66   Pulse 80   Temp (!) 97.2 F (36.2 C) (Axillary)   Resp (!) 22   Ht 6' (1.829 m)   Wt 100 kg   SpO2 100%   BMI 29.90 kg/m   HEMODYNAMICS:    VENTILATOR SETTINGS: Vent Mode: AC FiO2 (%):  [40 %-50 %] 50 % Set Rate:  [16 bmp-22 bmp] 22 bmp Vt Set:  [550 mL-580 mL] 580 mL PEEP:  [5 cmH20] 5 cmH20  INTAKE / OUTPUT: No intake/output data recorded.  PHYSICAL EXAMINATION: General: Sedated, no acute distress HEENT: ET tube in place, PERRLA, trachea midline, no JVD Neuro: Withdraws to noxious stimulus, gag reflex intact Cardiovascular: Apical pulse regular, S1-S2, no murmur regurg or gallop, +2 pulses bilaterally, no edmea Lungs: Breath sounds diminished in all lung fields, no wheezes or rhonchi Abdomen: Nondistended, normal bowel sounds in all 4 quadrants Musculoskeletal: Positive range of motion, no joint deformities Skin: Warm and dry  LABS:  BMET Recent Labs  Lab 05/16/18 0111  NA 131*  K 3.7  CL 96*  CO2 20*  BUN 12  CREATININE 1.16  GLUCOSE 448*     Electrolytes Recent Labs  Lab 05/16/18 0111  CALCIUM 8.3*    CBC Recent Labs  Lab 05/16/18 0111  WBC 16.1*  HGB 15.7  HCT 49.3  PLT 231    Coag's No results for input(s): APTT, INR in the last 168 hours.  Sepsis Markers Recent Labs  Lab 05/16/18 0111 05/16/18 0252  LATICACIDVEN 5.5* 1.6    ABG Recent Labs  Lab 05/16/18 0050  PHART 7.14*  PCO2ART PENDING  PO2ART 62*    Liver Enzymes Recent Labs  Lab 05/16/18 0111  AST 21  ALT 16  ALKPHOS 64  BILITOT 0.9  ALBUMIN 3.8    Cardiac Enzymes Recent Labs  Lab 05/16/18 0111  TROPONINI 0.03*    Glucose Recent Labs  Lab 05/16/18 0052 05/16/18 0325  GLUCAP 318* 418*    Imaging Dg Chest Port 1 View  Result Date: 05/16/2018 CLINICAL DATA:  Endotracheal tube placement. EXAM: PORTABLE CHEST 1 VIEW COMPARISON:  None available for comparison at time of study interpretation. FINDINGS: Endotracheal tube tip projects 3.3 cm above the carina. Nasogastric tube past proximal stomach, distal tip out of field of view. Multiple EKG lines overlie the patient and may obscure subtle underlying pathology. Pacer pad overlies the heart. Mild cardiomegaly. Interstitial prominence without pleural effusion or focal consolidation. No pneumothorax. Soft tissue planes and included osseous structures are non suspicious. IMPRESSION: 1. Endotracheal tube tip projects 3.3 cm above the carina. Endotracheal tube tip past proximal stomach. 2. Mild cardiomegaly. Interstitial prominence most compatible pulmonary edema. Electronically Signed   By: Elon Alas M.D.   On: 05/16/2018 01:44    STUDIES:  2D echo  CULTURES: Blood cultures x2  ANTIBIOTICS: Levaquin  SIGNIFICANT EVENTS: 05/16/2018: Admitted  LINES/TUBES: Peripheral IVs Foley catheter  DISCUSSION: 68 year old male presenting with acute hypoxic/hypercarbic respiratory failure, acute COPD exacerbation and uncontrolled type 2 diabetes  ASSESSMENT  Acute  hypoxic/hypercarbic respiratory failure requiring intubation Acute COPD exacerbation Hyperglycemic hyperosmolar state Uncontrolled type 2 diabetes CAD Hypertension Hyperlipidemia Cardiomyopathy  PLAN Hemodynamic monitoring per ICU, protocol Full vent support with current settings ABG and chest x-ray as needed IV fluids Blood glucose monitoring with IV insulin Antibiotics as above for prophylaxis Resume all home medications  Best Practice: Code Status: Full code Diet: N.p.o. GI prophylaxis: Pepcid VTE prophylaxis: Lovenox and SCDs  FAMILY  - Updates: Patient's girlfriend and pastor updated at [place]. Tukov-[place]ual ANP-BC Pulmonary and [place] Pager [phone] or [phone]  NB: This document was prepared using Dragon voice recognition software and may include unintentional dictation errors.    05/16/2018, 3:33 AM

## 2018-05-16 NOTE — ED Notes (Signed)
ED TO INPATIENT HANDOFF REPORT  ED Nurse Name and Phone #: [phone]  S Name/Age/Gender Alexander Cobb 68 y.o. male Room/Bed: ED26A/ED26A  Code Status   Code Status: Not on file  Home/SNF/Other Home Intubated  Triage Complete: Triage complete  Chief Complaint Diff Breathing  Triage Note Pt arrived via POV from triage where pt was apneic after pt got short of breath in the lobby and on the way to be triaged the patient stopped breathing. CPR in progress upon arrival to the room.    Allergies Allergies  Allergen Reactions  . Sulfa Drugs Cross Reactors Hives    Hives     Level of Care/Admitting Diagnosis ED Disposition    ED Disposition Condition [facility]: [place] [[id number]]  Level of Care: ICU [6]  Diagnosis: Acute respiratory failure with hypoxemia [facility]) [[id number]]  Admitting Physician: Harrie Foreman [[id number]]  Attending Physician: Harrie Foreman [[id number]]  Estimated length of stay: past midnight tomorrow  Certification:: I certify this patient will need inpatient services for at least 2 midnights  PT Class (Do Not Modify): Inpatient [101]  PT Acc Code (Do Not Modify): Private [1]       B Medical/Surgery History Past Medical History:  Diagnosis Date  . CAD (coronary artery disease) 12/21/2010  . Cardiomyopathy   . Diabetes mellitus without complication ([place])   . Fluttering heart 12/21/2010  . HTN (hypertension) 12/21/2010  . Hyperlipidemia    Past Surgical History:  Procedure Laterality Date  . CARDIAC CATHETERIZATION    . COLONOSCOPY WITH PROPOFOL N/A 11/07/2015   Procedure: COLONOSCOPY WITH PROPOFOL;  Surgeon: Lollie Sails, MD;  Location: [facility] ENDOSCOPY;  Service: Endoscopy;  Laterality: N/A;     A IV Location/Drains/Wounds Patient Lines/Drains/Airways Status   Active Line/Drains/Airways    Name:   Placement date:   Placement time:   Site:   Days:   Peripheral IV 05/16/18 Left Hand    05/16/18    0045    Hand   less than 1   Peripheral IV 05/16/18 Right Hand   05/16/18    0048    Hand   less than 1   Peripheral IV 05/16/18 Right Antecubital   05/16/18    0100    Antecubital   less than 1   Peripheral IV 05/16/18 Left Antecubital   05/16/18    -    Antecubital   less than 1   NG/OG Tube Orogastric 18 Fr. Center mouth Aucultation Measured external length of tube 58 cm   05/16/18    0054    Center mouth   less than 1   Airway 7.5 mm   05/16/18    0048     less than 1          Intake/Output Last 24 hours No intake or output data in the 24 hours ending 05/16/18 0238  Labs/Imaging Results for orders placed or performed during the hospital encounter of 05/16/18 (from the past 48 hour(s))  Blood gas, arterial     Status: Abnormal (Preliminary result)   Collection Time: 05/16/18 12:50 AM  Result Value Ref Range   FIO2 0.40    Delivery systems VENTILATOR    Mode ASSIST CONTROL    VT 550 mL   LHR 16 resp/min   Peep/cpap 5.0 cm H20   pH, Arterial 7.14 (LL) 7.350 - 7.450    Comment: CRITICAL RESULT CALLED TO, READ BACK BY AND VERIFIED WITH:  COLE, RN AT 0128 ON [id number]    pCO2 arterial PENDING 32.0 - 48.0 mmHg   pO2, Arterial 62 (L) 83.0 - 108.0 mmHg   Bicarbonate 23.8 20.0 - 28.0 mmol/L   Acid-base deficit 6.5 (H) 0.0 - 2.0 mmol/L   O2 Saturation 82.7 %   Patient temperature 37.0    Collection site RIGHT RADIAL    Sample type ARTERIAL DRAW     Comment: Performed at [facility], [address] [place], [place] [address]  Glucose, capillary     Status: Abnormal   Collection Time: 05/16/18 12:52 AM  Result Value Ref Range   Glucose-Capillary 318 (H) 70 - 99 mg/dL  CBC with Differential     Status: Abnormal   Collection Time: 05/16/18  1:11 AM  Result Value Ref Range   WBC 16.1 (H) 4.0 - 10.5 K/uL   RBC 5.64 4.22 - 5.81 MIL/uL   Hemoglobin 15.7 13.0 - 17.0 g/dL   HCT 49.3 39.0 - 52.0 %   MCV 87.4 80.0 - 100.0 fL   MCH 27.8 26.0 - 34.0 pg   MCHC 31.8  30.0 - 36.0 g/dL   RDW 12.1 11.5 - 15.5 %   Platelets 231 150 - 400 K/uL   nRBC 0.0 0.0 - 0.2 %   Neutrophils Relative % 53 %   Neutro Abs 8.7 (H) 1.7 - 7.7 K/uL   Lymphocytes Relative 35 %   Lymphs Abs 5.6 (H) 0.7 - 4.0 K/uL   Monocytes Relative 8 %   Monocytes Absolute 1.2 (H) 0.1 - 1.0 K/uL   Eosinophils Relative 2 %   Eosinophils Absolute 0.3 0.0 - 0.5 K/uL   Basophils Relative 1 %   Basophils Absolute 0.2 (H) 0.0 - 0.1 K/uL   Immature Granulocytes 1 %   Abs Immature Granulocytes 0.17 (H) 0.00 - 0.07 K/uL    Comment: Performed at [facility], [address] [place], [place] [address]  Comprehensive metabolic panel     Status: Abnormal   Collection Time: 05/16/18  1:11 AM  Result Value Ref Range   Sodium 131 (L) 135 - 145 mmol/L   Potassium 3.7 3.5 - 5.1 mmol/L    Comment: HEMOLYSIS AT THIS LEVEL MAY AFFECT RESULT   Chloride 96 (L) 98 - 111 mmol/L   CO2 20 (L) 22 - 32 mmol/L   Glucose, Bld 448 (H) 70 - 99 mg/dL   BUN 12 8 - 23 mg/dL   Creatinine, Ser 1.16 0.61 - 1.24 mg/dL   Calcium 8.3 (L) 8.9 - 10.3 mg/dL   Total Protein 7.7 6.5 - 8.1 g/dL   Albumin 3.8 3.5 - 5.0 g/dL   AST 21 15 - 41 U/L   ALT 16 0 - 44 U/L   Alkaline Phosphatase 64 38 - 126 U/L   Total Bilirubin 0.9 0.3 - 1.2 mg/dL   GFR calc non Af Amer >60 >60 mL/min   GFR calc Af Amer >60 >60 mL/min   Anion gap 15 5 - 15    Comment: Performed at [facility], [address] [place], [place] [address]  Troponin I - Once     Status: Abnormal   Collection Time: 05/16/18  1:11 AM  Result Value Ref Range   Troponin I 0.03 (HH) <0.03 ng/mL    Comment: Performed at [facility], [address] [place],  [address]  Urinalysis, Complete w Microscopic     Status: Abnormal   Collection Time: 05/16/18  1:11 AM  Result Value Ref Range   Color, Urine STRAW (A) YELLOW   APPearance CLEAR (A) CLEAR   Specific Gravity, Urine 1.018 1.005 - 1.030   pH 6.0 5.0 - 8.0   Glucose, UA >=500  (A) NEGATIVE mg/dL   Hgb urine dipstick SMALL (A) NEGATIVE   Bilirubin Urine NEGATIVE NEGATIVE   Ketones, ur NEGATIVE NEGATIVE mg/dL   Protein, ur 100 (A) NEGATIVE mg/dL   Nitrite NEGATIVE NEGATIVE   Leukocytes,Ua NEGATIVE NEGATIVE   RBC / HPF 6-10 0 - 5 RBC/hpf   WBC, UA 0-5 0 - 5 WBC/hpf   Bacteria, UA NONE SEEN NONE SEEN   Squamous Epithelial / LPF 0-5 0 - 5    Comment: Performed at [facility], [address], [place], [place] [address]  Urine Drug Screen, Qualitative     Status: None   Collection Time: 05/16/18  1:11 AM  Result Value Ref Range   Tricyclic, Ur Screen NONE DETECTED NONE DETECTED   Amphetamines, Ur Screen NONE DETECTED NONE DETECTED   MDMA (Ecstasy)Ur Screen NONE DETECTED NONE DETECTED   Cocaine Metabolite,Ur [place] NONE DETECTED NONE DETECTED   Opiate, Ur Screen NONE DETECTED NONE DETECTED   Phencyclidine (PCP) Ur S NONE DETECTED NONE DETECTED   Cannabinoid 50 Ng, Ur [place] NONE DETECTED NONE DETECTED   Barbiturates, Ur Screen NONE DETECTED NONE DETECTED   Benzodiazepine, Ur Scrn NONE DETECTED NONE DETECTED   Methadone Scn, Ur NONE DETECTED NONE DETECTED    Comment: (NOTE) Tricyclics + metabolites, urine    Cutoff 1000 ng/mL Amphetamines + metabolites, urine  Cutoff 1000 ng/mL MDMA (Ecstasy), urine              Cutoff 500 ng/mL Cocaine Metabolite, urine          Cutoff 300 ng/mL Opiate + metabolites, urine        Cutoff 300 ng/mL Phencyclidine (PCP), urine         Cutoff 25 ng/mL Cannabinoid, urine                 Cutoff 50 ng/mL Barbiturates + metabolites, urine  Cutoff 200 ng/mL Benzodiazepine, urine              Cutoff 200 ng/mL Methadone, urine                   Cutoff 300 ng/mL The urine drug screen provides only a preliminary, unconfirmed analytical test result and should not be used for non-medical purposes. Clinical consideration and professional judgment should be applied to any positive drug screen result due to possible interfering substances.  A more specific alternate chemical method must be used in order to obtain a confirmed analytical result. Gas chromatography / mass spectrometry (GC/MS) is the preferred confirmat ory method. Performed at [facility], [address] [place], [place] [address]   Lactic acid, plasma     Status: Abnormal   Collection Time: 05/16/18  1:11 AM  Result Value Ref Range   Lactic Acid, Venous 5.5 (HH) 0.5 - 1.9 mmol/L    Comment: CRITICAL RESULT CALLED TO, READ BACK BY AND VERIFIED WITH COLE Mileidy Atkin AT 0149 05/16/2018.  TFK Performed at [facility], [address] [place], [place] [address]   Influenza panel by PCR (type A & B)     Status: None   Collection Time: 05/16/18  1:11 AM  Result Value Ref Range   Influenza A By PCR NEGATIVE NEGATIVE   Influenza B By PCR NEGATIVE NEGATIVE  Comment: (NOTE) The Xpert Xpress Flu assay is intended as an aid in the diagnosis of  influenza and should not be used as a sole basis for treatment.  This  assay is FDA approved for nasopharyngeal swab specimens only. Nasal  washings and aspirates are unacceptable for Xpert Xpress Flu testing. Performed at [facility], [address] [place], [place] [address]    Dg Chest Port 1 View  Result Date: 05/16/2018 CLINICAL DATA:  Endotracheal tube placement. EXAM: PORTABLE CHEST 1 VIEW COMPARISON:  None available for comparison at time of study interpretation. FINDINGS: Endotracheal tube tip projects 3.3 cm above the carina. Nasogastric tube past proximal stomach, distal tip out of field of view. Multiple EKG lines overlie the patient and may obscure subtle underlying pathology. Pacer pad overlies the heart. Mild cardiomegaly. Interstitial prominence without pleural effusion or focal consolidation. No pneumothorax. Soft tissue planes and included osseous structures are non suspicious. IMPRESSION: 1. Endotracheal tube tip projects 3.3 cm above the carina. Endotracheal tube tip past  proximal stomach. 2. Mild cardiomegaly. Interstitial prominence most compatible pulmonary edema. Electronically Signed   By: Elon Alas M.D.   On: 05/16/2018 01:44    Pending Labs Unresulted Labs (From admission, onward)    Start     Ordered   05/16/18 0222  Ethanol  Add-on,   AD     05/16/18 0221   05/16/18 0052  Culture, blood (routine x 2)  BLOOD CULTURE X 2,   STAT     05/16/18 0051   05/16/18 0052  Lactic acid, plasma  Now then every 2 hours,   STAT     05/16/18 0051   Signed and Held  Creatinine, serum  (enoxaparin (LOVENOX)    CrCl >/= 30 ml/min)  Weekly,   R    Comments:  while on enoxaparin therapy    Signed and Held   Signed and Held  TSH  Add-on,   R     Signed and Held   Signed and Held  Hemoglobin A1c  Add-on,   R     Signed and Held          Vitals/Pain Today's Vitals   05/16/18 0220 05/16/18 0224 05/16/18 0225 05/16/18 0230  BP: 110/63  111/63 103/64  Pulse: 88  85 83  Resp: 20  (!) 22 (!) 22  Temp:  (!) 97.2 F (36.2 C)    TempSrc:  Axillary    SpO2: 100%  100% 100%  Weight:      Height:        Isolation Precautions No active isolations  Medications Medications  fentaNYL 2543mcg in NS 253mL (69mcg/ml) infusion-PREMIX (100 mcg/hr Intravenous Rate/Dose Change 05/16/18 0141)  propofol (DIPRIVAN) 1000 MG/100ML infusion (20 mcg/kg/min  100 kg Intravenous Rate/Dose Change 05/16/18 0238)  midazolam (VERSED) 50 mg in sodium chloride 0.9 % 50 mL (1 mg/mL) infusion (0.5 mg/hr Intravenous New Bag/Given 05/16/18 0215)  midazolam (VERSED) 2 MG/2ML injection (  Not Given 05/16/18 0210)  etomidate (AMIDATE) injection (20 mg Intravenous Given 05/16/18 0047)  succinylcholine (ANECTINE) injection (120 mg Intravenous Given 05/16/18 0047)  propofol (DIPRIVAN) 10 mg/mL bolus/IV push 50 mg (50 mg Intravenous Given 05/16/18 0123)  propofol (DIPRIVAN) 10 mg/mL bolus/IV push 50 mg (50 mg Intravenous Bolus 05/16/18 0103)  fentaNYL (SUBLIMAZE) bolus via infusion 100 mcg (100 mcg  Intravenous Bolus from Bag 05/16/18 0115)  furosemide (LASIX) injection 40 mg (40 mg Intravenous Given 05/16/18 0207)  midazolam (VERSED) injection 2 mg (2 mg  Intravenous Given 05/16/18 0207)  propofol (DIPRIVAN) 10 mg/mL bolus/IV push 50 mg (50 mg Intravenous Given 05/16/18 0205)    Mobility Walked on arrival High fall risk   Focused Assessments           R Recommendations: See Admitting Provider Note  Report given to:   Additional Notes: Propofol: 20 mcg/kg/min, Versed: 0.5 mg/h, fentanyl: 100 mcg/h

## 2018-05-16 NOTE — Progress Notes (Signed)
Pharmacy Antibiotic Note  Alexander Cobb is a 68 y.o. male admitted on 05/16/2018 with COPD exacerbation.  Pharmacy has been consulted for levofloxacin dosing.  Plan: Levofloxacin 500 mg IV q24h x 5 days  Height: 6' (182.9 cm) Weight: 220 lb 7.4 oz (100 kg) IBW/kg (Calculated) : 77.6  Temp (24hrs), Avg:97.9 F (36.6 C), Min:97.2 F (36.2 C), Max:98.5 F (36.9 C)  Recent Labs  Lab 05/16/18 0111 05/16/18 0252  WBC 16.1*  --   CREATININE 1.16  --   LATICACIDVEN 5.5* 1.6    Estimated Creatinine Clearance: 75.7 mL/min (by C-G formula based on SCr of 1.16 mg/dL).    Allergies  Allergen Reactions  . Sulfa Drugs Cross Reactors Hives    Hives     Antimicrobials this admission: Levofloxacin 3/7 >>   Dose adjustments this admission: NA  Microbiology results: 3/7 BCx: NG 12 hrs 3/7 MRSA PCR: negative  Thank you for allowing pharmacy to be a part of this patient's care.  Tawnya Crook, PharmD Pharmacy Resident  05/16/2018 10:27 AM

## 2018-05-16 NOTE — ED Notes (Signed)
Date and time results received: 05/16/18 1:50 AM  Test: Lactic Acid Critical Value: 5.5  Name of Provider Notified: Beather Arbour  Orders Received? Or Actions Taken?: Acknowledged

## 2018-05-17 ENCOUNTER — Encounter: Payer: Self-pay | Admitting: Adult Health

## 2018-05-17 ENCOUNTER — Other Ambulatory Visit: Payer: Self-pay

## 2018-05-17 DIAGNOSIS — L02415 Cutaneous abscess of right lower limb: Secondary | ICD-10-CM

## 2018-05-17 LAB — BASIC METABOLIC PANEL
Anion gap: 11 (ref 5–15)
BUN: 27 mg/dL — ABNORMAL HIGH (ref 8–23)
CO2: 21 mmol/L — ABNORMAL LOW (ref 22–32)
Calcium: 8.7 mg/dL — ABNORMAL LOW (ref 8.9–10.3)
Chloride: 96 mmol/L — ABNORMAL LOW (ref 98–111)
Creatinine, Ser: 1.31 mg/dL — ABNORMAL HIGH (ref 0.61–1.24)
GFR calc Af Amer: >60 mL/min (ref >60)
GFR calc non Af Amer: 56 mL/min — ABNORMAL LOW (ref >60)
Glucose, Bld: 466 mg/dL — ABNORMAL HIGH (ref 70–99)
Potassium: 4.3 mmol/L (ref 3.5–5.1)
Sodium: 128 mmol/L — ABNORMAL LOW (ref 135–145)

## 2018-05-17 LAB — GLUCOSE, CAPILLARY
Glucose-Capillary: 456 mg/dL — ABNORMAL HIGH (ref 70–99)
Glucose-Capillary: 258 mg/dL — ABNORMAL HIGH (ref 70–99)
Glucose-Capillary: 180 mg/dL — ABNORMAL HIGH (ref 70–99)
Glucose-Capillary: 165 mg/dL — ABNORMAL HIGH (ref 70–99)

## 2018-05-17 LAB — CBC
HCT: 40.5 % (ref 39.0–52.0)
Hemoglobin: 13.9 g/dL (ref 13.0–17.0)
MCH: 28.4 pg (ref 26.0–34.0)
MCHC: 34.3 g/dL (ref 30.0–36.0)
MCV: 82.7 fL (ref 80.0–100.0)
Platelets: 175 10*3/uL (ref 150–400)
RBC: 4.9 MIL/uL (ref 4.22–5.81)
RDW: 12.2 % (ref 11.5–15.5)
WBC: 12.3 10*3/uL — ABNORMAL HIGH (ref 4.0–10.5)
nRBC: 0 % (ref 0.0–0.2)

## 2018-05-17 LAB — TROPONIN I: Troponin I: 0.07 ng/mL (ref <0.03)

## 2018-05-17 MED ORDER — PRAVASTATIN SODIUM 20 MG PO TABS
40.0000 mg | ORAL_TABLET | Freq: Every day | ORAL | Status: DC
  Administered 2018-05-17 – 2018-05-19 (×3): 40 mg via ORAL
  Filled 2018-05-17 (×3): qty 2

## 2018-05-17 MED ORDER — INFLUENZA VAC SPLIT HIGH-DOSE 0.5 ML IM SUSY
0.5000 mL | PREFILLED_SYRINGE | INTRAMUSCULAR | Status: AC
  Administered 2018-05-19: 0.5 mL via INTRAMUSCULAR
  Filled 2018-05-17 (×2): qty 0.5

## 2018-05-17 MED ORDER — FENTANYL CITRATE (PF) 100 MCG/2ML IJ SOLN: 12.5000 ug | INTRAMUSCULAR | Status: DC | PRN

## 2018-05-17 MED ORDER — FLUTICASONE FUROATE-VILANTEROL 100-25 MCG/INH IN AEPB
1.0000 | INHALATION_SPRAY | Freq: Every day | RESPIRATORY_TRACT | Status: DC
  Administered 2018-05-18 – 2018-05-20 (×3): 1 via RESPIRATORY_TRACT
  Filled 2018-05-17 (×2): qty 28

## 2018-05-17 MED ORDER — INSULIN GLARGINE 100 UNIT/ML ~~LOC~~ SOLN
15.0000 [IU] | Freq: Every day | SUBCUTANEOUS | Status: DC
  Administered 2018-05-17 – 2018-05-19 (×3): 15 [IU] via SUBCUTANEOUS
  Filled 2018-05-17 (×5): qty 0.15

## 2018-05-17 MED ORDER — INSULIN GLARGINE 100 UNIT/ML ~~LOC~~ SOLN
15.0000 [IU] | Freq: Once | SUBCUTANEOUS | Status: AC
  Administered 2018-05-17: 15 [IU] via SUBCUTANEOUS
  Filled 2018-05-17: qty 0.15

## 2018-05-17 MED ORDER — ASPIRIN 81 MG PO CHEW
81.0000 mg | CHEWABLE_TABLET | Freq: Every day | ORAL | Status: DC
  Administered 2018-05-17 – 2018-05-20 (×4): 81 mg via ORAL
  Filled 2018-05-17 (×4): qty 1

## 2018-05-17 MED ORDER — KETOROLAC TROMETHAMINE 15 MG/ML IJ SOLN
15.0000 mg | Freq: Four times a day (QID) | INTRAMUSCULAR | Status: DC | PRN
  Administered 2018-05-17 – 2018-05-19 (×5): 15 mg via INTRAVENOUS
  Filled 2018-05-17 (×5): qty 1

## 2018-05-17 MED ORDER — IPRATROPIUM-ALBUTEROL 20-100 MCG/ACT IN AERS: 1.0000 | INHALATION_SPRAY | RESPIRATORY_TRACT | Status: DC | PRN

## 2018-05-17 MED ORDER — METFORMIN HCL 500 MG PO TABS
500.0000 mg | ORAL_TABLET | Freq: Two times a day (BID) | ORAL | Status: DC
  Administered 2018-05-17 – 2018-05-20 (×6): 500 mg via ORAL
  Filled 2018-05-17 (×7): qty 1

## 2018-05-17 MED ORDER — IPRATROPIUM-ALBUTEROL 0.5-2.5 (3) MG/3ML IN SOLN: 3.0000 mL | RESPIRATORY_TRACT | Status: DC | PRN

## 2018-05-17 MED ORDER — PNEUMOCOCCAL VAC POLYVALENT 25 MCG/0.5ML IJ INJ
0.5000 mL | INJECTION | INTRAMUSCULAR | Status: AC
  Administered 2018-05-18: 0.5 mL via INTRAMUSCULAR
  Filled 2018-05-17: qty 0.5

## 2018-05-17 MED ORDER — AMLODIPINE BESYLATE 10 MG PO TABS
10.0000 mg | ORAL_TABLET | Freq: Every day | ORAL | Status: DC
  Administered 2018-05-17 – 2018-05-20 (×4): 10 mg via ORAL
  Filled 2018-05-17 (×4): qty 1

## 2018-05-17 MED ORDER — LISINOPRIL 20 MG PO TABS
20.0000 mg | ORAL_TABLET | Freq: Every day | ORAL | Status: DC
  Administered 2018-05-17 – 2018-05-20 (×4): 20 mg via ORAL
  Filled 2018-05-17 (×4): qty 1

## 2018-05-17 MED ORDER — SODIUM CHLORIDE 0.9 % IV BOLUS
1000.0000 mL | Freq: Once | INTRAVENOUS | Status: AC
  Administered 2018-05-17: 1000 mL via INTRAVENOUS

## 2018-05-17 MED ORDER — NITROGLYCERIN 0.4 MG SL SUBL: 0.4000 mg | SUBLINGUAL_TABLET | SUBLINGUAL | Status: DC | PRN

## 2018-05-17 NOTE — Progress Notes (Signed)
Patient ID: Alexander Cobb, male   DOB: 04-26-1950, 68 y.o.   MRN: [id number]  [employer] PROGRESS NOTE  Alexander Cobb HKV:[id number] DOB: 20-Feb-1951 DOA: 05/16/2018 PCP: [name], MD  HPI/Subjective: Patient feeling better.  Still some chest discomfort.  Had abscess drained from his right lower buttock.  Objective: Vitals:   05/17/18 1100 05/17/18 1200  BP: 128/66 (!) 143/67  Pulse: 90 89  Resp: 15 17  Temp:  97.7 F (36.5 C)  SpO2: 99% 98%    Filed Weights   05/16/18 0101  Weight: 100 kg    ROS: Review of Systems  Constitutional: Negative for chills and fever.  Eyes: Negative for blurred vision.  Respiratory: Positive for shortness of breath. Negative for cough.   Cardiovascular: Positive for chest pain.  Gastrointestinal: Negative for abdominal pain, constipation, diarrhea, nausea and vomiting.  Genitourinary: Negative for dysuria.  Musculoskeletal: Positive for joint pain.  Neurological: Negative for dizziness and headaches.   Exam: Physical Exam  Constitutional: He is oriented to person, place, and time.  HENT:  Nose: No mucosal edema.  Mouth/Throat: No oropharyngeal exudate or posterior oropharyngeal edema.  Eyes: Pupils are equal, round, and reactive to light. Conjunctivae, EOM and lids are normal.  Neck: No JVD present. Carotid bruit is not present. No edema present. No thyroid mass and no thyromegaly present.  Cardiovascular: S1 normal and S2 normal. Exam reveals no gallop.  No murmur heard. Pulses:      Dorsalis pedis pulses are 2+ on the right side and 2+ on the left side.  Respiratory: No respiratory distress. He has decreased breath sounds in the right lower field and the left lower field. He has no wheezes. He has no rhonchi. He has no rales.  GI: Soft. Bowel sounds are normal. There is no abdominal tenderness.  Musculoskeletal:     Right ankle: He exhibits no swelling.     Left ankle: He exhibits no swelling.  Lymphadenopathy:    He  has no cervical adenopathy.  Neurological: He is alert and oriented to person, place, and time. No cranial nerve deficit.  Skin: Skin is warm. No rash noted. Nails show no clubbing.  Psychiatric: He has a normal mood and affect.      Data Reviewed: Basic Metabolic Panel: Recent Labs  Lab 05/16/18 0111 05/16/18 0408 05/17/18 0527  NA 131*  --  128*  K 3.7  --  4.3  CL 96*  --  96*  CO2 20*  --  21*  GLUCOSE 448*  --  466*  BUN 12  --  27*  CREATININE 1.16  --  1.31*  CALCIUM 8.3*  --  8.7*  MG  --  2.0  --   PHOS  --  4.1  --    Liver Function Tests: Recent Labs  Lab 05/16/18 0111  AST 21  ALT 16  ALKPHOS 64  BILITOT 0.9  PROT 7.7  ALBUMIN 3.8   CBC: Recent Labs  Lab 05/16/18 0111 05/17/18 0527  WBC 16.1* 12.3*  NEUTROABS 8.7*  --   HGB 15.7 13.9  HCT 49.3 40.5  MCV 87.4 82.7  PLT 231 175   Cardiac Enzymes: Recent Labs  Lab 05/16/18 0111 05/16/18 0613 05/16/18 1402 05/16/18 1853 05/17/18 0527  TROPONINI 0.03* 0.15* 0.18* 0.15* 0.07*   CBG: Recent Labs  Lab 05/16/18 1412 05/16/18 1700 05/16/18 2104 05/17/18 0739 05/17/18 1201  GLUCAP 192* 280* 248* 456* 258*    Recent Results (from the past  240 hour(s))  Culture, blood (routine x 2)     Status: Abnormal (Preliminary result)   Collection Time: 05/16/18  1:11 AM  Result Value Ref Range Status   Specimen Description   Final    BLOOD RIGHT ANTECUBITAL Performed at [facility], [address], [place], [place] [address]    Special Requests   Final    BOTTLES DRAWN AEROBIC AND ANAEROBIC Blood Culture results may not be optimal due to an excessive volume of blood received in culture bottles Performed at [facility], [address], [place], [place] [address]    Culture  Setup Time   Final    GRAM POSITIVE COCCI IN CLUSTERS IN BOTH AEROBIC AND ANAEROBIC BOTTLES CRITICAL RESULT CALLED TO, READ BACK BY AND VERIFIED WITH: Winfield Rast PATEL @1616  05/16/18 AKT Performed at [facility], [place] [address], [place], [place] [address]    Culture STAPHYLOCOCCUS AUREUS (A)  Final   Report Status PENDING  Incomplete  Culture, blood (routine x 2)     Status: Abnormal (Preliminary result)   Collection Time: 05/16/18  1:11 AM  Result Value Ref Range Status   Specimen Description   Final    BLOOD LEFT ANTECUBITAL Performed at [facility], [address] [place], [place] [address]    Special Requests   Final    BOTTLES DRAWN AEROBIC AND ANAEROBIC Blood Culture adequate volume Performed at [facility], [address] [place], [place] [address]    Culture  Setup Time   Final    GRAM POSITIVE COCCI IN CLUSTERS IN BOTH AEROBIC AND ANAEROBIC BOTTLES CRITICAL VALUE NOTED.  VALUE IS CONSISTENT WITH PREVIOUSLY REPORTED AND CALLED VALUE. Performed at [facility], [address] [place], [place] [address]    Culture STAPHYLOCOCCUS AUREUS (A)  Final   Report Status PENDING  Incomplete  Blood Culture ID Panel (Reflexed)     Status: Abnormal   Collection Time: 05/16/18  1:11 AM  Result Value Ref Range Status   Enterococcus species NOT DETECTED NOT DETECTED Final   Listeria monocytogenes NOT DETECTED NOT DETECTED Final   Staphylococcus species DETECTED (A) NOT DETECTED Final    Comment: CRITICAL RESULT CALLED TO, READ BACK BY AND VERIFIED WITH: KISHAN PATEL @1616  05/16/18 AKT    Staphylococcus aureus (BCID) DETECTED (A) NOT DETECTED Final    Comment: Methicillin (oxacillin) susceptible Staphylococcus aureus (MSSA). Preferred therapy is anti staphylococcal beta lactam antibiotic (Cefazolin or Nafcillin), unless clinically contraindicated. CRITICAL RESULT CALLED TO, READ BACK BY AND VERIFIED WITH: Winfield Rast PATEL @1616  05/16/18 AKT    Methicillin resistance NOT DETECTED NOT DETECTED Final   Streptococcus species NOT DETECTED NOT DETECTED Final   Streptococcus agalactiae NOT DETECTED NOT DETECTED Final   Streptococcus pneumoniae NOT DETECTED NOT  DETECTED Final   Streptococcus pyogenes NOT DETECTED NOT DETECTED Final   Acinetobacter baumannii NOT DETECTED NOT DETECTED Final   Enterobacteriaceae species NOT DETECTED NOT DETECTED Final   Enterobacter cloacae complex NOT DETECTED NOT DETECTED Final   Escherichia coli NOT DETECTED NOT DETECTED Final   Klebsiella oxytoca NOT DETECTED NOT DETECTED Final   Klebsiella pneumoniae NOT DETECTED NOT DETECTED Final   Proteus species NOT DETECTED NOT DETECTED Final   Serratia marcescens NOT DETECTED NOT DETECTED Final   Haemophilus influenzae NOT DETECTED NOT DETECTED Final   Neisseria meningitidis NOT DETECTED NOT DETECTED Final   Pseudomonas aeruginosa NOT DETECTED NOT DETECTED Final   Candida albicans NOT DETECTED NOT DETECTED Final  Candida glabrata NOT DETECTED NOT DETECTED Final   Candida krusei NOT DETECTED NOT DETECTED Final   Candida parapsilosis NOT DETECTED NOT DETECTED Final   Candida tropicalis NOT DETECTED NOT DETECTED Final    Comment: Performed at [facility], [address] [place], [place] [address]  MRSA PCR Screening     Status: None   Collection Time: 05/16/18  3:35 AM  Result Value Ref Range Status   MRSA by PCR NEGATIVE NEGATIVE Final    Comment:        The GeneXpert MRSA Assay (FDA approved for NASAL specimens only), is one component of a comprehensive MRSA colonization surveillance program. It is not intended to diagnose MRSA infection nor to guide or monitor treatment for MRSA infections. Performed at [facility], [address] [place], [place] [address]      Studies: Ct Angio Chest Pe W Or Wo Contrast  Result Date: 05/16/2018 CLINICAL DATA:  Recent respiratory arrest EXAM: CT ANGIOGRAPHY CHEST WITH CONTRAST TECHNIQUE: Multidetector CT imaging of the chest was performed using the standard protocol during bolus administration of intravenous contrast. Multiplanar CT image reconstructions and MIPs were obtained to evaluate the vascular  anatomy. CONTRAST:  179mL ISOVUE-370 IOPAMIDOL (ISOVUE-370) INJECTION 76% COMPARISON:  Chest x-ray from earlier in the same day. FINDINGS: Cardiovascular: Thoracic aorta demonstrates atherosclerotic calcification. Poor opacification is identified and evaluation is somewhat limited. No definitive aneurysmal dilatation is seen. Heart is mildly enlarged in size. The pulmonary artery shows a normal branching pattern without intraluminal filling defect to suggest pulmonary embolism. Mediastinum/Nodes: Thoracic inlet is within normal limits. No definitive esophageal abnormality is noted. Gastric catheter extends into the stomach. Endotracheal tube is noted in satisfactory position. Scattered mediastinal lymph nodes are identified not significant by size criteria and likely reactive in nature. Lungs/Pleura: Lungs demonstrate mild emphysematous changes as well as some interstitial thickening. Bibasilar atelectasis/early infiltrate is seen with small pleural effusions. No focal confluent infiltrate is seen. No pneumothorax is noted. Upper Abdomen: Visualized upper abdomen shows no acute abnormality. Musculoskeletal: Mild degenerative changes of the thoracic spine are noted. No acute bony abnormality is noted. Review of the MIP images confirms the above findings. IMPRESSION: No evidence of pulmonary emboli. Changes of mild CHF with bilateral small effusions and interstitial edema. Mild bibasilar atelectasis/early infiltrate. Electronically Signed   By: Inez Catalina M.D.   On: 05/16/2018 04:53   Dg Chest Port 1 View  Result Date: 05/16/2018 CLINICAL DATA:  Endotracheal tube placement. EXAM: PORTABLE CHEST 1 VIEW COMPARISON:  None available for comparison at time of study interpretation. FINDINGS: Endotracheal tube tip projects 3.3 cm above the carina. Nasogastric tube past proximal stomach, distal tip out of field of view. Multiple EKG lines overlie the patient and may obscure subtle underlying pathology. Pacer pad overlies  the heart. Mild cardiomegaly. Interstitial prominence without pleural effusion or focal consolidation. No pneumothorax. Soft tissue planes and included osseous structures are non suspicious. IMPRESSION: 1. Endotracheal tube tip projects 3.3 cm above the carina. Endotracheal tube tip past proximal stomach. 2. Mild cardiomegaly. Interstitial prominence most compatible pulmonary edema. Electronically Signed   By: Elon Alas M.D.   On: 05/16/2018 01:44    Scheduled Meds: . amLODipine  10 mg Oral Daily  . aspirin  81 mg Oral Daily  . docusate sodium  100 mg Oral BID  . enoxaparin (LOVENOX) injection  40 mg Subcutaneous Q24H  . fluticasone furoate-vilanterol  1 puff Inhalation Daily  . [START ON 05/18/2018] Influenza vac split  quadrivalent PF  0.5 mL Intramuscular Tomorrow-1000  . insulin aspart  0-20 Units Subcutaneous TID WC  . insulin glargine  15 Units Subcutaneous Q2200  . lisinopril  20 mg Oral Daily  . metFORMIN  500 mg Oral BID WC  . [START ON 05/18/2018] pneumococcal 23 valent vaccine  0.5 mL Intramuscular Tomorrow-1000  . pravastatin  40 mg Oral q1800   Continuous Infusions: .  ceFAZolin (ANCEF) IV 2 g (05/17/18 1337)    Assessment/Plan:  1. Staph aureus bacteremia.  Pneumonia seen on CT scan.  Likely source is abscess right buttock area.  Patient on IV Ancef.  Repeat blood cultures tomorrow morning.  Consulted cardiology for TEE.  We will consult ID tomorrow morning.   2. Respiratory arrest that required CPR and intubation.  Patient extubated yesterday.  Patient breathing comfortably on room air. 3. Acute hypoxic respiratory failure.  Patient does not wear oxygen at home.  Tapered off oxygen at this time. 4. History of CAD and cardiomyopathy.  Fluids stopped. 5. Type 2 diabetes mellitus.  On glargine insulin and sliding scale.  Placed back on metformin 6. Hypertension on Norvasc 7. Hyperlipidemia unspecified on pravastatin  Code Status:     Code Status Orders  (From  admission, onward)         Start     Ordered   05/16/18 0331  Full code  Continuous     05/16/18 0330        Code Status History    This patient has a current code status but no historical code status.     Family Communication: Permission to speak in front of family at the bedside Disposition Plan: I told the patient he will likely need IV antibiotics and PICC line.  We must wait until repeat blood cultures negative for 48 hours until PICC line placed.  Earliest potential would be Wednesday.  Consultants:  Critical care specialist  Cardiology  Procedures:  CPR  Intubation and extubation  Antibiotics:  Ancef  Time spent: 28 minutes  [place]

## 2018-05-17 NOTE — Progress Notes (Signed)
Report called to Leigh on [place], pt will transport to [facility] via wheelchair.  VSS on room air.  Chart and meds and belongings will transfer with him.  Pt and wife agreeable to transfer

## 2018-05-17 NOTE — Progress Notes (Signed)
PULMONARY / CRITICAL CARE MEDICINE  Name: Alexander Cobb MRN: [id number] DOB: Jul 12, 1950    LOS: 1  Referring Provider: Dr. Marcille Blanco Reason for Referral: Acute hypoxic and hypercarbic respiratory failure Brief patient description: 68 year old male who presented to the ED with acute dyspnea and hypoxemia, progressed into respiratory arrest, had 1 cycle of CPR and now intubated and sedated  HPI: This is a 68 year old male presented to the ED via personal vehicle with acute dyspnea.  While in the ED lobby, he almost fell and was caught by [employer], placed in a wheelchair and taken for evaluation.  Upon arrival in the room, patient became apneic and had 1 cycle of CPR performed with return of spontaneous circulation.  His ED labs showed a blood glucose level of 448 mg/dL and a lactic acid of 5.5.  He was emergently intubated and transferred to the ICU for further management.  His CT chest angiogram was negative for PE but showed small bilateral pleural effusion and mild pulmonary edema.  He is a current everyday smoker.  Subjective: Extubated and doing well.  Currently on room air.  Cultures positive for staph aureus.  He remains afebrile.  Blood sugars are improved but still moderately hyperglycemic with readings ranging between 150 and 280 mg/dL.  He is complaining of generalized rib cage pain and pain with activity.  VITAL SIGNS: BP (!) 158/79   Pulse 89   Temp 98.7 F (37.1 C) (Oral)   Resp 15   Ht 6' (1.829 m)   Wt 100 kg   SpO2 100%   BMI 29.90 kg/m   HEMODYNAMICS:   VENTILATOR SETTINGS: Vent Mode: PRVC FiO2 (%):  [30 %] 30 % Set Rate:  [22 bmp] 22 bmp Vt Set:  [580 mL] 580 mL PEEP:  [5 cmH20] 5 cmH20 Plateau Pressure:  [18 cmH20] 18 cmH20  INTAKE / OUTPUT: I/O last 3 completed shifts: In: 931.6 [P.O.:240; I.V.:691.6] Out: 1125 [Urine:1075; Emesis/NG output:50]  PHYSICAL EXAMINATION: General: Awake, no acute distress HEENT:  PERRLA, trachea midline, no  JVD Neuro: Alert and oriented x4, no focal deficits Cardiovascular: Apical pulse regular, S1-S2, no murmur regurg or gallop, +2 pulses bilaterally, no edmea Lungs: Breath sounds diminished in all lung fields, no wheezes or rhonchi Abdomen: Nondistended, normal bowel sounds in all 4 quadrants Musculoskeletal: Positive range of motion, no joint deformities Skin: Warm and dry  LABS:  BMET Recent Labs  Lab 05/16/18 0111  NA 131*  K 3.7  CL 96*  CO2 20*  BUN 12  CREATININE 1.16  GLUCOSE 448*    Electrolytes Recent Labs  Lab 05/16/18 0111 05/16/18 0408  CALCIUM 8.3*  --   MG  --  2.0  PHOS  --  4.1    CBC Recent Labs  Lab 05/16/18 0111  WBC 16.1*  HGB 15.7  HCT 49.3  PLT 231    Coag's No results for input(s): APTT, INR in the last 168 hours.  Sepsis Markers Recent Labs  Lab 05/16/18 0111 05/16/18 0252  LATICACIDVEN 5.5* 1.6    ABG Recent Labs  Lab 05/16/18 0050 05/16/18 0345  PHART 7.14* 7.37  PCO2ART PENDING 42  PO2ART 62* 89    Liver Enzymes Recent Labs  Lab 05/16/18 0111  AST 21  ALT 16  ALKPHOS 64  BILITOT 0.9  ALBUMIN 3.8    Cardiac Enzymes Recent Labs  Lab 05/16/18 0613 05/16/18 1402 05/16/18 1853  TROPONINI 0.15* 0.18* 0.15*    Glucose Recent Labs  Lab 05/16/18  1059 05/16/18 1157 05/16/18 1310 05/16/18 1412 05/16/18 1700 05/16/18 2104  GLUCAP 193* 171* 152* 192* 280* 248*    Imaging No results found.  STUDIES:  2D echo  CULTURES: Blood cultures x2  ANTIBIOTICS: Levaquin  SIGNIFICANT EVENTS: 05/16/2018: Admitted  LINES/TUBES: Peripheral IVs Foley catheter  DISCUSSION: 68 year old male presenting with acute hypoxic/hypercarbic respiratory failure, acute COPD exacerbation and uncontrolled type 2 diabetes  ASSESSMENT  Acute hypoxic/hypercarbic respiratory failure-resolved Staph aureus bacteremia Acute COPD exacerbation Hyperglycemic hyperosmolar state-improved Uncontrolled type 2  diabetes CAD Hypertension Hyperlipidemia Cardiomyopathy  PLAN Hemodynamic monitoring per ICU, protocol Full vent support with current settings Discontinue IV fluids as patient is eating well Continue sliding-scale insulin and Lantus Antibiotics as above for prophylaxis Follow-up final cultures and sensitivities and adjust antibiotics accordingly  Resume all home medications  Best Practice: Code Status: Full code Diet: Carb modified diet GI prophylaxis: Pepcid VTE prophylaxis: Lovenox and SCDs  FAMILY  - Updates: No family at bedside.  Will update when available  Alexander Cobb ANP-BC Pulmonary and [place] Pager [phone] or [phone]  NB: This document was prepared using Dragon voice recognition software and may include unintentional dictation errors.    05/17/2018, 4:50 AM

## 2018-05-17 NOTE — Consult Note (Signed)
  Patient with MSSA in blood.  On cefazolin TTE without vegetation.  Recommend TEE No indication for levaquin  Duration of cefazolin depending on source, if identified.   Will place on consult list for tomorrow.   Thayer Headings, MD       [facility] Staphylococcus aureus bacteremia   Staphylococcus aureus bacteremia (SAB) is associated with a high rate of complications and mortality.  Specific aspects of clinical management are critical to optimizing the outcome of patients with SAB.  Therefore, the [facility] Antimicrobial Management Team [facility]) has initiated an intervention aimed at improving the management of SAB at [facility].  To do so, Infectious Diseases physicians are providing an evidence-based consult for the management of all patients with SAB.     Yes No Comments  Perform follow-up blood cultures (even if the patient is afebrile) to ensure clearance of bacteremia [x]  []    Remove vascular catheter and obtain follow-up blood cultures after the removal of the catheter []  []    Perform echocardiography to evaluate for endocarditis (transthoracic ECHO is 40-50% sensitive, TEE is > 90% sensitive) [x]  []  Please keep in mind, that neither test can definitively EXCLUDE endocarditis, and that should clinical suspicion remain high for endocarditis the patient should then still be treated with an "endocarditis" duration of therapy = 6 weeks  Consult electrophysiologist to evaluate implanted cardiac device (pacemaker, ICD) []  []    Ensure source control []  []  Have all abscesses been drained effectively? Have deep seeded infections (septic joints or osteomyelitis) had appropriate surgical debridement?  Investigate for "metastatic" sites of infection [x]  []  Does the patient have ANY symptom or physical exam finding that would suggest a deeper infection (back or neck pain that may be suggestive of vertebral osteomyelitis or epidural abscess, muscle pain that could be  a symptom of pyomyositis)?  Keep in mind that for deep seeded infections MRI imaging with contrast is preferred rather than other often insensitive tests such as plain x-rays, especially early in a patient's presentation.  Change antibiotic therapy to __________________ []  []  Beta-lactam antibiotics are preferred for MSSA due to higher cure rates.   If on Vancomycin, goal trough should be 15 - 20 mcg/mL  Estimated duration of IV antibiotic therapy:   [x]  []  Consult case management for probably prolonged outpatient IV antibiotic therapy

## 2018-05-17 NOTE — Progress Notes (Signed)
Inpatient Diabetes Program Recommendations  AACE/ADA: New Consensus Statement on Inpatient Glycemic Control (2015)  Target Ranges:  Prepandial:   less than 140 mg/dL      Peak postprandial:   less than 180 mg/dL (1-2 hours)      Critically ill patients:  140 - 180 mg/dL   Lab Results  Component Value Date   GLUCAP 258 (H) 05/17/2018   HGBA1C 11.9 (H) 05/16/2018    Review of Glycemic Control  Diabetes history:type 2 Outpatient Diabetes medications: Lantus 10 units daily? Metformin Current orders for Inpatient glycemic control: Lantus 15 units every HS, Novolog RESISTANT correction scale TID, Metformin   Inpatient Diabetes Program Recommendations:   Received diabetes coordinator consult. Continue Lantus 15 units every HS, continue Novolog RESISTANT correction scale, adding Novolog HS scale. May need to titrate dosages as needed.  If blood sugars continue to be greater than 180 mg/dl and on steroids, recommend adding Novolog 4 units TID with meals if patient eats at least 50 % of meal.   Will continue to monitor blood sugars while in the hospital.   Harvel Ricks RN BSN CDE Diabetes Coordinator Pager: [phone]  8am-5pm

## 2018-05-17 NOTE — Progress Notes (Addendum)
Pt with large hardened erythematous and edematous area on back of right thigh.  Is sore to the touch per patient, but before he came in he reports it was "hard to even sit down because it was so sore.  It's a little better now."  As patient's blood cultures are MSSA + writing RN consulted Dr Patsey Berthold who opted to aspirate the abcess.  Approx 20 ccs very thick purple/pink exudate aspirated and sent to lab for gram stain and culture.  Bleeding controlled and gauze/tegaderm placed over puncture site.  Pt resting comfortably in room at this time

## 2018-05-17 NOTE — Progress Notes (Signed)
Foley Dc'd, pt given urinal. Voided x1

## 2018-05-17 NOTE — Procedures (Signed)
Patient is a 68 year old admitted on 16 May 2018 noted to have MSSA bacteremia.  Abscess noted on the right thigh area is fluctuant, violaceous and surrounded by erythema.  No increased warmth.  Patient has been on antibiotics namely cefazolin.  The procedure of puncture drainage was discussed with the patient and his wife.  They agree to proceed.  The area was cleansed with ChloraPrep and and anesthetized with 1% lidocaine.  The abscess was then aspirated using a 20-gauge needle.  Approximately 8 mL's of chocolate milk looking fluid was aspirated.  This was placed in a sterile container for cultures.  Pressure was held over the area until good hemostasis was obtained.  Loss was minimal less than 5 mL patient tolerated the procedure well.  The area was cleansed with ChloraPrep again and it was dressed sterilely with 2 x 2's and Tegaderm.

## 2018-05-17 NOTE — Progress Notes (Signed)
Patient states he wears CPAP Nasal pillows at home, but will call respiratory if he decides to wear one while in hospital. Patient declined at this time.

## 2018-05-18 ENCOUNTER — Encounter
Admission: EM | Disposition: A | Discharge status: Home Health | Payer: Self-pay | Source: Home / Self Care | Attending: Internal Medicine

## 2018-05-18 ENCOUNTER — Encounter: Payer: Self-pay | Admitting: Emergency Medicine

## 2018-05-18 ENCOUNTER — Inpatient Hospital Stay (HOSPITAL_COMMUNITY)
Admit: 2018-05-18 | Discharge: 2018-05-18 | Disposition: A | Discharge status: Home / Self Care | Payer: Medicare HMO | Attending: Physician Assistant | Admitting: Physician Assistant

## 2018-05-18 DIAGNOSIS — B9561 Methicillin susceptible Staphylococcus aureus infection as the cause of diseases classified elsewhere: Secondary | ICD-10-CM

## 2018-05-18 DIAGNOSIS — E118 Type 2 diabetes mellitus with unspecified complications: Secondary | ICD-10-CM

## 2018-05-18 DIAGNOSIS — Z8674 Personal history of sudden cardiac arrest: Secondary | ICD-10-CM

## 2018-05-18 DIAGNOSIS — I2510 Atherosclerotic heart disease of native coronary artery without angina pectoris: Secondary | ICD-10-CM

## 2018-05-18 DIAGNOSIS — I10 Essential (primary) hypertension: Secondary | ICD-10-CM

## 2018-05-18 DIAGNOSIS — R7881 Bacteremia: Secondary | ICD-10-CM

## 2018-05-18 DIAGNOSIS — Z881 Allergy status to other antibiotic agents status: Secondary | ICD-10-CM

## 2018-05-18 DIAGNOSIS — F17200 Nicotine dependence, unspecified, uncomplicated: Secondary | ICD-10-CM

## 2018-05-18 DIAGNOSIS — L299 Pruritus, unspecified: Secondary | ICD-10-CM

## 2018-05-18 DIAGNOSIS — J9601 Acute respiratory failure with hypoxia: Secondary | ICD-10-CM

## 2018-05-18 DIAGNOSIS — Z9114 Patient's other noncompliance with medication regimen: Secondary | ICD-10-CM

## 2018-05-18 DIAGNOSIS — L0231 Cutaneous abscess of buttock: Secondary | ICD-10-CM

## 2018-05-18 HISTORY — PX: TEE WITHOUT CARDIOVERSION: SHX5443

## 2018-05-18 LAB — CULTURE, BLOOD (ROUTINE X 2): Special Requests: ADEQUATE

## 2018-05-18 LAB — BASIC METABOLIC PANEL
Anion gap: 8 (ref 5–15)
BUN: 30 mg/dL — ABNORMAL HIGH (ref 8–23)
CO2: 25 mmol/L (ref 22–32)
Calcium: 8.5 mg/dL — ABNORMAL LOW (ref 8.9–10.3)
Chloride: 102 mmol/L (ref 98–111)
Creatinine, Ser: 1.07 mg/dL (ref 0.61–1.24)
GFR calc Af Amer: >60 mL/min (ref >60)
GFR calc non Af Amer: >60 mL/min (ref >60)
Glucose, Bld: 162 mg/dL — ABNORMAL HIGH (ref 70–99)
Potassium: 4 mmol/L (ref 3.5–5.1)
Sodium: 135 mmol/L (ref 135–145)

## 2018-05-18 LAB — CBC
HCT: 42.3 % (ref 39.0–52.0)
Hemoglobin: 14.3 g/dL (ref 13.0–17.0)
MCH: 27.9 pg (ref 26.0–34.0)
MCHC: 33.8 g/dL (ref 30.0–36.0)
MCV: 82.5 fL (ref 80.0–100.0)
Platelets: 198 10*3/uL (ref 150–400)
RBC: 5.13 MIL/uL (ref 4.22–5.81)
RDW: 12.3 % (ref 11.5–15.5)
WBC: 13.4 10*3/uL — ABNORMAL HIGH (ref 4.0–10.5)
nRBC: 0 % (ref 0.0–0.2)

## 2018-05-18 LAB — BLOOD GAS, ARTERIAL
Acid-base deficit: 6.5 mmol/L — ABNORMAL HIGH (ref 0.0–2.0)
Bicarbonate: 23.8 mmol/L (ref 20.0–28.0)
FIO2: 0.4
MECHVT: 550 mL
O2 Saturation: 82.7 %
PEEP: 5 cmH2O
Patient temperature: 37
RATE: 16 resp/min
pH, Arterial: 7.14 — CL (ref 7.350–7.450)
pO2, Arterial: 62 mmHg — ABNORMAL LOW (ref 83.0–108.0)

## 2018-05-18 LAB — GLUCOSE, CAPILLARY
Glucose-Capillary: 143 mg/dL — ABNORMAL HIGH (ref 70–99)
Glucose-Capillary: 98 mg/dL (ref 70–99)
Glucose-Capillary: 105 mg/dL — ABNORMAL HIGH (ref 70–99)
Glucose-Capillary: 178 mg/dL — ABNORMAL HIGH (ref 70–99)

## 2018-05-18 SURGERY — ECHOCARDIOGRAM, TRANSESOPHAGEAL
Anesthesia: General

## 2018-05-18 MED ORDER — FENTANYL CITRATE (PF) 100 MCG/2ML IJ SOLN
INTRAMUSCULAR | Status: AC | PRN
  Administered 2018-05-18 (×2): 25 ug via INTRAVENOUS

## 2018-05-18 MED ORDER — IPRATROPIUM-ALBUTEROL 0.5-2.5 (3) MG/3ML IN SOLN
3.0000 mL | Freq: Four times a day (QID) | RESPIRATORY_TRACT | Status: DC
  Administered 2018-05-18 – 2018-05-19 (×5): 3 mL via RESPIRATORY_TRACT
  Filled 2018-05-18 (×6): qty 3

## 2018-05-18 MED ORDER — FENTANYL CITRATE (PF) 100 MCG/2ML IJ SOLN
INTRAMUSCULAR | Status: AC
  Filled 2018-05-18: qty 2

## 2018-05-18 MED ORDER — LIDOCAINE VISCOUS HCL 2 % MT SOLN
OROMUCOSAL | Status: AC
  Filled 2018-05-18: qty 15

## 2018-05-18 MED ORDER — BUTAMBEN-TETRACAINE-BENZOCAINE 2-2-14 % EX AERO
INHALATION_SPRAY | CUTANEOUS | Status: AC
  Filled 2018-05-18: qty 5

## 2018-05-18 MED ORDER — MIDAZOLAM HCL 2 MG/2ML IJ SOLN
INTRAMUSCULAR | Status: AC | PRN
  Administered 2018-05-18: 2 mg via INTRAVENOUS
  Administered 2018-05-18: 1 mg via INTRAVENOUS

## 2018-05-18 MED ORDER — SODIUM CHLORIDE 0.9 % IV SOLN
INTRAVENOUS | Status: DC | PRN
  Administered 2018-05-18 – 2018-05-20 (×5): 250 mL via INTRAVENOUS

## 2018-05-18 MED ORDER — MIDAZOLAM HCL 5 MG/5ML IJ SOLN
INTRAMUSCULAR | Status: AC
  Filled 2018-05-18: qty 5

## 2018-05-18 MED ORDER — SODIUM CHLORIDE 0.9 % IV SOLN
INTRAVENOUS | Status: DC
  Administered 2018-05-18: 12:00:00 via INTRAVENOUS

## 2018-05-18 MED ORDER — SODIUM CHLORIDE FLUSH 0.9 % IV SOLN
INTRAVENOUS | Status: AC
  Filled 2018-05-18: qty 10

## 2018-05-18 NOTE — Progress Notes (Signed)
Patient ID: Alexander Cobb, male   DOB: Oct 24, 1950, 68 y.o.   MRN: [id number]  [employer] [facility] PROGRESS NOTE  Alexander Cobb:[id number] DOB: 10/25/1950 DOA: 05/16/2018 PCP: [name], MD  HPI/Subjective: Patient feeling better.  Patient also having some chest pain from chest compressions the other day.  Some shortness of breath and wheeze  Objective: Vitals:   05/18/18 1315 05/18/18 1328  BP: (!) 142/78 (!) 144/78  Pulse: 78 80  Resp: 17 17  Temp:  97.8 F (36.6 C)  SpO2: 92% 92%    Filed Weights   05/16/18 0101 05/18/18 1155  Weight: 100 kg 100 kg    ROS: Review of Systems  Constitutional: Negative for chills and fever.  Eyes: Negative for blurred vision.  Respiratory: Positive for cough, shortness of breath and wheezing.   Cardiovascular: Positive for chest pain.  Gastrointestinal: Negative for abdominal pain, constipation, diarrhea, nausea and vomiting.  Genitourinary: Negative for dysuria.  Musculoskeletal: Positive for joint pain.  Neurological: Negative for dizziness and headaches.   Exam: Physical Exam  Constitutional: He is oriented to person, place, and time.  HENT:  Nose: No mucosal edema.  Mouth/Throat: No oropharyngeal exudate or posterior oropharyngeal edema.  Eyes: Pupils are equal, round, and reactive to light. Conjunctivae, EOM and lids are normal.  Neck: No JVD present. Carotid bruit is not present. No edema present. No thyroid mass and no thyromegaly present.  Cardiovascular: S1 normal and S2 normal. Exam reveals no gallop.  No murmur heard. Pulses:      Dorsalis pedis pulses are 2+ on the right side and 2+ on the left side.  Respiratory: No respiratory distress. He has decreased breath sounds in the right lower field and the left lower field. He has wheezes in the right middle field, the right lower field, the left middle field and the left lower field. He has no rhonchi. He has no rales.  GI: Soft. Bowel sounds are normal. There is no  abdominal tenderness.  Musculoskeletal:     Right ankle: He exhibits no swelling.     Left ankle: He exhibits no swelling.  Lymphadenopathy:    He has no cervical adenopathy.  Neurological: He is alert and oriented to person, place, and time. No cranial nerve deficit.  Skin: Skin is warm. No rash noted. Nails show no clubbing.  Psychiatric: He has a normal mood and affect.      Data Reviewed: Basic Metabolic Panel: Recent Labs  Lab 05/16/18 0111 05/16/18 0408 05/17/18 0527 05/18/18 0426  NA 131*  --  128* 135  K 3.7  --  4.3 4.0  CL 96*  --  96* 102  CO2 20*  --  21* 25  GLUCOSE 448*  --  466* 162*  BUN 12  --  27* 30*  CREATININE 1.16  --  1.31* 1.07  CALCIUM 8.3*  --  8.7* 8.5*  MG  --  2.0  --   --   PHOS  --  4.1  --   --    Liver Function Tests: Recent Labs  Lab 05/16/18 0111  AST 21  ALT 16  ALKPHOS 64  BILITOT 0.9  PROT 7.7  ALBUMIN 3.8   CBC: Recent Labs  Lab 05/16/18 0111 05/17/18 0527 05/18/18 0426  WBC 16.1* 12.3* 13.4*  NEUTROABS 8.7*  --   --   HGB 15.7 13.9 14.3  HCT 49.3 40.5 42.3  MCV 87.4 82.7 82.5  PLT 231 175 198   Cardiac  Enzymes: Recent Labs  Lab 05/16/18 0111 05/16/18 0613 05/16/18 1402 05/16/18 1853 05/17/18 0527  TROPONINI 0.03* 0.15* 0.18* 0.15* 0.07*   CBG: Recent Labs  Lab 05/17/18 1201 05/17/18 1722 05/17/18 2106 05/18/18 0801 05/18/18 1116  GLUCAP 258* 180* 165* 143* 98    Recent Results (from the past 240 hour(s))  Culture, blood (routine x 2)     Status: Abnormal   Collection Time: 05/16/18  1:11 AM  Result Value Ref Range Status   Specimen Description   Final    BLOOD RIGHT ANTECUBITAL Performed at [facility], [address] [place], [place] [address]    Special Requests   Final    BOTTLES DRAWN AEROBIC AND ANAEROBIC Blood Culture results may not be optimal due to an excessive volume of blood received in culture bottles Performed at [facility], [address]  [place], [place] [address]    Culture  Setup Time   Final    GRAM POSITIVE COCCI IN CLUSTERS IN BOTH AEROBIC AND ANAEROBIC BOTTLES CRITICAL RESULT CALLED TO, READ BACK BY AND VERIFIED WITH: Winfield Rast PATEL @1616  05/16/18 AKT Performed at [facility], [place] [address] [place], [place] [address]    Culture STAPHYLOCOCCUS AUREUS (A)  Final   Report Status 05/18/2018 FINAL  Final   Organism ID, Bacteria STAPHYLOCOCCUS AUREUS  Final      Susceptibility   Staphylococcus aureus - MIC*    CIPROFLOXACIN 1 SENSITIVE Sensitive     ERYTHROMYCIN <=0.25 SENSITIVE Sensitive     GENTAMICIN <=0.5 SENSITIVE Sensitive     OXACILLIN <=0.25 SENSITIVE Sensitive     TETRACYCLINE <=1 SENSITIVE Sensitive     VANCOMYCIN 1 SENSITIVE Sensitive     TRIMETH/SULFA <=10 SENSITIVE Sensitive     CLINDAMYCIN <=0.25 SENSITIVE Sensitive     RIFAMPIN <=0.5 SENSITIVE Sensitive     Inducible Clindamycin NEGATIVE Sensitive     * STAPHYLOCOCCUS AUREUS  Culture, blood (routine x 2)     Status: Abnormal   Collection Time: 05/16/18  1:11 AM  Result Value Ref Range Status   Specimen Description   Final    BLOOD LEFT ANTECUBITAL Performed at [facility], [address], [place], [place] [address]    Special Requests   Final    BOTTLES DRAWN AEROBIC AND ANAEROBIC Blood Culture adequate volume Performed at [facility], [address] [place], [place] [address]    Culture  Setup Time   Final    GRAM POSITIVE COCCI IN CLUSTERS IN BOTH AEROBIC AND ANAEROBIC BOTTLES CRITICAL VALUE NOTED.  VALUE IS CONSISTENT WITH PREVIOUSLY REPORTED AND CALLED VALUE. Performed at [facility], [address] [place], [place] [address]    Culture (A)  Final    STAPHYLOCOCCUS AUREUS SUSCEPTIBILITIES PERFORMED ON PREVIOUS CULTURE WITHIN THE LAST 5 DAYS. Performed at [facility], [place] [address], [place],  [address]    Report Status 05/18/2018 FINAL  Final  Blood Culture ID Panel (Reflexed)      Status: Abnormal   Collection Time: 05/16/18  1:11 AM  Result Value Ref Range Status   Enterococcus species NOT DETECTED NOT DETECTED Final   Listeria monocytogenes NOT DETECTED NOT DETECTED Final   Staphylococcus species DETECTED (A) NOT DETECTED Final    Comment: CRITICAL RESULT CALLED TO, READ BACK BY AND VERIFIED WITH: Winfield Rast PATEL @1616  05/16/18 AKT    Staphylococcus aureus (BCID) DETECTED (A) NOT DETECTED Final    Comment: Methicillin (oxacillin) susceptible Staphylococcus aureus (MSSA).  Preferred therapy is anti staphylococcal beta lactam antibiotic (Cefazolin or Nafcillin), unless clinically contraindicated. CRITICAL RESULT CALLED TO, READ BACK BY AND VERIFIED WITH: Winfield Rast PATEL @1616  05/16/18 AKT    Methicillin resistance NOT DETECTED NOT DETECTED Final   Streptococcus species NOT DETECTED NOT DETECTED Final   Streptococcus agalactiae NOT DETECTED NOT DETECTED Final   Streptococcus pneumoniae NOT DETECTED NOT DETECTED Final   Streptococcus pyogenes NOT DETECTED NOT DETECTED Final   Acinetobacter baumannii NOT DETECTED NOT DETECTED Final   Enterobacteriaceae species NOT DETECTED NOT DETECTED Final   Enterobacter cloacae complex NOT DETECTED NOT DETECTED Final   Escherichia coli NOT DETECTED NOT DETECTED Final   Klebsiella oxytoca NOT DETECTED NOT DETECTED Final   Klebsiella pneumoniae NOT DETECTED NOT DETECTED Final   Proteus species NOT DETECTED NOT DETECTED Final   Serratia marcescens NOT DETECTED NOT DETECTED Final   Haemophilus influenzae NOT DETECTED NOT DETECTED Final   Neisseria meningitidis NOT DETECTED NOT DETECTED Final   Pseudomonas aeruginosa NOT DETECTED NOT DETECTED Final   Candida albicans NOT DETECTED NOT DETECTED Final   Candida glabrata NOT DETECTED NOT DETECTED Final   Candida krusei NOT DETECTED NOT DETECTED Final   Candida parapsilosis NOT DETECTED NOT DETECTED Final   Candida tropicalis NOT DETECTED NOT DETECTED Final    Comment: Performed at [facility], [address] [place], [place] [address]  MRSA PCR Screening     Status: None   Collection Time: 05/16/18  3:35 AM  Result Value Ref Range Status   MRSA by PCR NEGATIVE NEGATIVE Final    Comment:        The GeneXpert MRSA Assay (FDA approved for NASAL specimens only), is one component of a comprehensive MRSA colonization surveillance program. It is not intended to diagnose MRSA infection nor to guide or monitor treatment for MRSA infections. Performed at [facility], [address], [place], [place] [address]   Urine Culture     Status: Abnormal (Preliminary result)   Collection Time: 05/17/18 10:48 AM  Result Value Ref Range Status   Specimen Description   Final    URINE, RANDOM Performed at [facility], [address] [place], [place] [address]    Special Requests   Final    NONE Performed at [facility], [place], [place] [address]    Culture 20,000 COLONIES/mL ENTEROCOCCUS FAECALIS (A)  Final   Report Status PENDING  Incomplete  Aerobic/Anaerobic Culture (surgical/deep wound)     Status: None (Preliminary result)   Collection Time: 05/17/18 11:09 AM  Result Value Ref Range Status   Specimen Description   Final    THIGH Performed at [facility], [address], [place], [place] [address]    Special Requests   Final    NONE Performed at [facility], [address] [place], [place] [address]    Gram Stain   Final    ABUNDANT WBC PRESENT, PREDOMINANTLY PMN ABUNDANT GRAM POSITIVE COCCI IN PAIRS IN CLUSTERS    Culture   Final    CULTURE REINCUBATED FOR BETTER GROWTH Performed at [facility], [place] [address] [place], [place] [address]    Report Status PENDING  Incomplete  CULTURE, BLOOD (ROUTINE X 2) w Reflex to ID Panel     Status: None (Preliminary result)   Collection Time: 05/18/18  4:18 AM  Result Value Ref Range Status   Specimen Description BLOOD RAC  Final    Special Requests  Final    BOTTLES DRAWN AEROBIC AND ANAEROBIC Blood Culture adequate volume   Culture   Final    NO GROWTH < 12 HOURS Performed at [facility], [address] [place], [place] [address]    Report Status PENDING  Incomplete  CULTURE, BLOOD (ROUTINE X 2) w Reflex to ID Panel     Status: None (Preliminary result)   Collection Time: 05/18/18  4:26 AM  Result Value Ref Range Status   Specimen Description BLOOD R FOREARM  Final   Special Requests   Final    BOTTLES DRAWN AEROBIC AND ANAEROBIC Blood Culture results may not be optimal due to an inadequate volume of blood received in culture bottles   Culture   Final    NO GROWTH < 12 HOURS Performed at [facility], [address], [place], [place] [address]    Report Status PENDING  Incomplete      Scheduled Meds: . amLODipine  10 mg Oral Daily  . aspirin  81 mg Oral Daily  . butamben-tetracaine-benzocaine      . docusate sodium  100 mg Oral BID  . enoxaparin (LOVENOX) injection  40 mg Subcutaneous Q24H  . fentaNYL      . fluticasone furoate-vilanterol  1 puff Inhalation Daily  . Influenza vac split quadrivalent PF  0.5 mL Intramuscular Tomorrow-1000  . insulin aspart  0-20 Units Subcutaneous TID WC  . insulin glargine  15 Units Subcutaneous Q2200  . ipratropium-albuterol  3 mL Nebulization Q6H  . lidocaine      . lisinopril  20 mg Oral Daily  . metFORMIN  500 mg Oral BID WC  . midazolam      . pravastatin  40 mg Oral q1800  . sodium chloride flush       Continuous Infusions: . sodium chloride 20 mL/hr at 05/18/18 1203  .  ceFAZolin (ANCEF) IV Stopped (05/18/18 0846)    Assessment/Plan:  1. Staph aureus bacteremia.  Pneumonia seen on CT scan.  Likely source is abscess right buttock area.  Patient on IV Ancef.  Repeat blood cultures drawn this morning.  TEE negative for endocarditis.  Consult infectious disease.  Likely will need PICC line Wednesday after cultures negative for 48 hours  and a total of 2 weeks of IV antibiotics.   2. Respiratory arrest that required CPR and intubation.  Patient extubated soon after admission.  Patient breathing comfortably on room air. 3. Acute hypoxic respiratory failure.  Patient does not wear oxygen at home.  Tapered off oxygen at this time.  Started nebulizer treatments with slight wheeze today. 4. History of CAD and cardiomyopathy. 5. Type 2 diabetes mellitus.  On glargine insulin and sliding scale.  Placed back on metformin 6. Hypertension on Norvasc 7. Hyperlipidemia unspecified on pravastatin  Code Status:     Code Status Orders  (From admission, onward)         Start     Ordered   05/16/18 0331  Full code  Continuous     05/16/18 0330        Code Status History    This patient has a current code status but no historical code status.     Family Communication: Family yesterday Disposition Plan: Hopefully home Wednesday afternoon with home health  Consultants:  Critical care specialist  Cardiology  Infectious disease  Procedures:  CPR  Intubation and extubation  Antibiotics:  Ancef  Time spent: 27 minutes  [place]

## 2018-05-18 NOTE — Clinical Social Work Note (Signed)
Clinical Social Work Assessment  Patient Details  Name: Alexander Cobb MRN: [id number] Date of Birth: 1950-08-30  Date of referral:  05/18/18               Reason for consult:  Other (Comment Required)(IV antibiotics)                Permission sought to share information with:  Case Manager Permission granted to share information::  Yes, Verbal Permission Granted  Name::        Agency::     Relationship::     Contact Information:     Housing/Transportation Living arrangements for the past 2 months:  Single Family Home Source of Information:  Patient Patient Interpreter Needed:  None Criminal Activity/Legal Involvement Pertinent to Current Situation/Hospitalization:  No - Comment as needed Significant Relationships:  Significant Other, Siblings Lives with:    Do you feel safe going back to the place where you live?  Yes Need for family participation in patient care:  No (Coment)  Care giving concerns:  Patient resides at home and is independent in all activities of daily living.   Social Worker assessment / plan:  CSW met with patient regarding needing a few weeks of IV antibiotic therapy. CSW mentioned home versus going to skilled nursing home. Patient adamantly refused skilled nursing home but is agreeable to return home with home health.  Employment status:    Insurance informationEducational psychologist PT Recommendations:    Information / Referral to community resources:     Patient/Family's Response to care:  Patient expressed appreciation for CSW assistance.  Patient/Family's Understanding of and Emotional Response to Diagnosis, Current Treatment, and Prognosis:  Patient was insistent regarding returning home.   Emotional Assessment Appearance:  Appears stated age Attitude/Demeanor/Rapport:  (pleasant and cooperative) Affect (typically observed):  Calm Orientation:  Oriented to Self, Oriented to Place, Oriented to  Time, Oriented to Situation Alcohol / Substance use:  Not  Applicable Psych involvement (Current and /or in the community):  No (Comment)  Discharge Needs  Concerns to be addressed:  Other (Comment Required(arranging home health with iv antibiotics) Readmission within the last 30 days:  No Current discharge risk:  None Barriers to Discharge:  No Barriers Identified   Shela Leff, LCSW 05/18/2018, 3:33 PM

## 2018-05-18 NOTE — CV Procedure (Signed)
TEE was performed without complications.  It showed moderately reduced LV systolic function with no evidence of valvular vegetations.  No evidence of endocarditis.  Full report to follow.  During this procedure the patient is administered a total of Versed 3 mg and Fentanyl 50 mcg to achieve and maintain moderate conscious sedation.  The patient's heart rate, blood pressure, and oxygen saturation are monitored continuously during the procedure. The period of conscious sedation is 13 minutes, of which I was present face-to-face 100% of this time.

## 2018-05-18 NOTE — Consult Note (Signed)
NAME: Alexander Cobb  DOB: 06-05-1950  MRN: [id number]  Date/Time: 05/18/2018 11:01 AM  REQUESTING PROVIDER: weiting Subjective:  REASON FOR CONSULT: Staph aureus bacteremia? Alexander Cobb is a 68 y.o. male with a history of CAD, HTN, DM  Non compliant with meds and stopped them months ago presented to the ED with SOB. 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 After his shift he went to [place] and ate and while going home he was very sob and drove himself to the ED around midnight. In the Triage area he became apneic and stopped breathing. 1 cycle of CPR was performed with return of circulation.  Blood glucose was 448 and lactate was 5.5/He was emergently Intubated and was admitted to ICU.Blood culture was sent and he was started on levaquin  CT chest was neg for PE. He was extubated the morning on 3/7.The cause for his reps failure was due to acute copd exacerbation. Blood culture is positive for MSSA and cefazolin was started. He also was noted to have an absces son his rt buttock and Dr.Gonzalez aspirated cholcate colored fluid from it and sent for culture. I am seeing the patient for MSSA bacteremia Pt has no hardware in his body. He has dry skin and scratches a lot No vaping  Past Medical History:  Diagnosis Date  . CAD (coronary artery disease) 12/21/2010  . Cardiomyopathy   . Diabetes mellitus without complication ([place])   . HTN (hypertension) 12/21/2010  . Hyperlipidemia   . OSA (obstructive sleep apnea)    Uses CPAP at home    Past Surgical History:  Procedure Laterality Date  . CARDIAC CATHETERIZATION    . COLONOSCOPY WITH PROPOFOL N/A 11/07/2015   Procedure: COLONOSCOPY WITH PROPOFOL;  Surgeon: Lollie Sails, MD;  Location: [facility] ENDOSCOPY;  Service: Endoscopy;  Laterality: N/A;    [place] Lives on his won, smoker, no alcohol ( says he quit 20 yrs ago) Has a dog   Family History  Problem Relation Age of Onset  . COPD Father    Allergies  Allergen  Reactions  . Sulfa Drugs Cross Reactors Hives    Hives    ? Current Facility-Administered Medications  Medication Dose Route Frequency Provider Last Rate Last Dose  . acetaminophen (TYLENOL) tablet 650 mg  650 mg Oral Q6H PRN Harrie Foreman, MD       Or  . acetaminophen (TYLENOL) suppository 650 mg  650 mg Rectal Q6H PRN Harrie Foreman, MD      . amLODipine (NORVASC) tablet 10 mg  10 mg Oral Daily Tukov-Yual, Magdalene S, NP   10 mg at 05/18/18 0954  . aspirin chewable tablet 81 mg  81 mg Oral Daily Tukov-Yual, Magdalene S, NP   81 mg at 05/18/18 0953  . bisacodyl (DULCOLAX) suppository 10 mg  10 mg Rectal Daily PRN Tukov-Yual, Magdalene S, NP      . ceFAZolin (ANCEF) IVPB 2g/100 mL premix  2 g Intravenous Q8H Tyler Pita, MD   Stopped at 05/18/18 [phone]  . docusate sodium (COLACE) capsule 100 mg  100 mg Oral BID Harrie Foreman, MD   100 mg at 05/17/18 2130  . enoxaparin (LOVENOX) injection 40 mg  40 mg Subcutaneous Q24H Harrie Foreman, MD   40 mg at 05/17/18 2132  . fentaNYL (SUBLIMAZE) injection 12.5 mcg  12.5 mcg Intravenous Q2H PRN Tukov-Yual, Magdalene S, NP      . fluticasone furoate-vilanterol (BREO ELLIPTA) 100-25  MCG/INH 1 puff  1 puff Inhalation Daily Tukov-Yual, Magdalene S, NP   1 puff at 05/18/18 0953  . Influenza vac split quadrivalent PF (FLUZONE HIGH-DOSE) injection 0.5 mL  0.5 mL Intramuscular Tomorrow-1000 Vernard Gambles L, MD      . insulin aspart (novoLOG) injection 0-20 Units  0-20 Units Subcutaneous TID WC Tyler Pita, MD   3 Units at 05/18/18 [phone]  . insulin glargine (LANTUS) injection 15 Units  15 Units Subcutaneous Q2200 Tukov-Yual, Magdalene S, NP   15 Units at 05/17/18 2238  . ipratropium-albuterol (DUONEB) 0.5-2.5 (3) MG/3ML nebulizer solution 3 mL  3 mL Nebulization Q6H Wieting, Richard, MD      . ketorolac (TORADOL) 15 MG/ML injection 15 mg  15 mg Intravenous Q6H PRN Tukov-Yual, Magdalene S, NP   15 mg at 05/18/18 0757  . lisinopril  (PRINIVIL,ZESTRIL) tablet 20 mg  20 mg Oral Daily Tukov-Yual, Magdalene S, NP   20 mg at 05/18/18 0954  . metFORMIN (GLUCOPHAGE) tablet 500 mg  500 mg Oral BID WC Tukov-Yual, Magdalene S, NP   500 mg at 05/17/18 1937  . nitroGLYCERIN (NITROSTAT) SL tablet 0.4 mg  0.4 mg Sublingual Q5 min PRN Tukov-Yual, Magdalene S, NP      . ondansetron (ZOFRAN) tablet 4 mg  4 mg Oral Q6H PRN Harrie Foreman, MD       Or  . ondansetron [facility]) injection 4 mg  4 mg Intravenous Q6H PRN Harrie Foreman, MD      . pravastatin (PRAVACHOL) tablet 40 mg  40 mg Oral q1800 Tukov-Yual, Magdalene S, NP   40 mg at 05/17/18 1738  . sennosides (SENOKOT) 8.8 MG/5ML syrup 5 mL  5 mL Per Tube BID PRN Tukov-Yual, Magdalene S, NP         Abtx:  Anti-infectives (From admission, onward)   Start     Dose/Rate Route Frequency Ordered Stop   05/16/18 1700  ceFAZolin (ANCEF) IVPB 2g/100 mL premix     2 g 200 mL/hr over 30 Minutes Intravenous Every 8 hours 05/16/18 1641     05/16/18 1100  levofloxacin (LEVAQUIN) IVPB 500 mg  Status:  Discontinued     500 mg 100 mL/hr over 60 Minutes Intravenous Every 24 hours 05/16/18 1023 05/17/18 1153      REVIEW OF SYSTEMS:  Const: negative fever, negative chills, negative weight loss Eyes: negative diplopia or visual changes, negative eye pain ENT: negative coryza, negative sore throat Resp: cough, no hemoptysis, ++dyspnea Cards: negative for chest pain, palpitations, lower extremity edema GU: negative for frequency, dysuria and hematuria GI: Negative for abdominal pain, diarrhea, bleeding, constipation Skin:++pruritus Heme: negative for easy bruising and gum/nose bleeding MS: c/o pain chest wall following CPR Neurolo:negative for headaches, dizziness, vertigo, memory problems  Psych: negative for feelings of anxiety, depression  Endocrine:has  polyuria /polydipsia Allergy/Immunology- sulfa- rash Objective:  VITALS:  BP (!) 148/84 (BP Location: Right Arm)   Pulse 78   Temp  97.8 F (36.6 C) (Oral)   Resp 17   Ht 6' (1.829 m)   Wt 100 kg   SpO2 93%   BMI 29.90 kg/m  PHYSICAL EXAM:  General: Alert, cooperative, no distress, appears stated age.  Head: Normocephalic, without obvious abnormality, atraumatic. Eyes: Conjunctivae clear, anicteric sclerae. Pupils are equal ENT Nares normal. No drainage or sinus tenderness. Lips, mucosa, and tongue normal. No Thrush Neck: Supple, symmetrical, no adenopathy, thyroid: non tender no carotid bruit and no JVD. Back: No CVA tenderness. Lungs: b/l  air entry Heart: s1s2 Abdomen: Soft, non-tender,not distended. Bowel sounds normal. No masses Extremities: atraumatic, no cyanosis. No edema. No clubbing Skin: Dry skin Bruising left arm Scratch Jovann shins Rt buttock -abscess aspiration site with dressing Lymph: Cervical, supraclavicular normal. Neurologic: Grossly non-focal Pertinent Labs Lab Results CBC    Component Value Date/Time   WBC 13.4 (H) 05/18/2018 0426   RBC 5.13 05/18/2018 0426   HGB 14.3 05/18/2018 0426   HCT 42.3 05/18/2018 0426   PLT 198 05/18/2018 0426   MCV 82.5 05/18/2018 0426   MCH 27.9 05/18/2018 0426   MCHC 33.8 05/18/2018 0426   RDW 12.3 05/18/2018 0426   LYMPHSABS 5.6 (H) 05/16/2018 0111   MONOABS 1.2 (H) 05/16/2018 0111   EOSABS 0.3 05/16/2018 0111   BASOSABS 0.2 (H) 05/16/2018 0111    CMP Latest Ref Rng & Units 05/18/2018 05/17/2018 05/16/2018  Glucose 70 - 99 mg/dL 162(H) 466(H) 448(H)  BUN 8 - 23 mg/dL 30(H) 27(H) 12  Creatinine 0.61 - 1.24 mg/dL 1.07 1.31(H) 1.16  Sodium 135 - 145 mmol/L 135 128(L) 131(L)  Potassium 3.5 - 5.1 mmol/L 4.0 4.3 3.7  Chloride 98 - 111 mmol/L 102 96(L) 96(L)  CO2 22 - 32 mmol/L 25 21(L) 20(L)  Calcium 8.9 - 10.3 mg/dL 8.5(L) 8.7(L) 8.3(L)  Total Protein 6.5 - 8.1 g/dL - - 7.7  Total Bilirubin 0.3 - 1.2 mg/dL - - 0.9  Alkaline Phos 38 - 126 U/L - - 64  AST 15 - 41 U/L - - 21  ALT 0 - 44 U/L - - 16      Microbiology: Recent Results (from the past  240 hour(s))  Culture, blood (routine x 2)     Status: Abnormal   Collection Time: 05/16/18  1:11 AM  Result Value Ref Range Status   Specimen Description   Final    BLOOD RIGHT ANTECUBITAL Performed at [facility], [address] [place], [place] [address]    Special Requests   Final    BOTTLES DRAWN AEROBIC AND ANAEROBIC Blood Culture results may not be optimal due to an excessive volume of blood received in culture bottles Performed at [facility], [address] [place], [place] [address]    Culture  Setup Time   Final    GRAM POSITIVE COCCI IN CLUSTERS IN BOTH AEROBIC AND ANAEROBIC BOTTLES CRITICAL RESULT CALLED TO, READ BACK BY AND VERIFIED WITH: Winfield Rast PATEL @1616  05/16/18 AKT Performed at [facility], [place] [address] [place], [place] [address]    Culture STAPHYLOCOCCUS AUREUS (A)  Final   Report Status 05/18/2018 FINAL  Final   Organism ID, Bacteria STAPHYLOCOCCUS AUREUS  Final      Susceptibility   Staphylococcus aureus - MIC*    CIPROFLOXACIN 1 SENSITIVE Sensitive     ERYTHROMYCIN <=0.25 SENSITIVE Sensitive     GENTAMICIN <=0.5 SENSITIVE Sensitive     OXACILLIN <=0.25 SENSITIVE Sensitive     TETRACYCLINE <=1 SENSITIVE Sensitive     VANCOMYCIN 1 SENSITIVE Sensitive     TRIMETH/SULFA <=10 SENSITIVE Sensitive     CLINDAMYCIN <=0.25 SENSITIVE Sensitive     RIFAMPIN <=0.5 SENSITIVE Sensitive     Inducible Clindamycin NEGATIVE Sensitive     * STAPHYLOCOCCUS AUREUS  Culture, blood (routine x 2)     Status: Abnormal   Collection Time: 05/16/18  1:11 AM  Result Value Ref Range Status   Specimen Description   Final    BLOOD LEFT ANTECUBITAL Performed at [facility],  [place], [place] [address]    Special Requests   Final    BOTTLES DRAWN AEROBIC AND ANAEROBIC Blood Culture adequate volume Performed at [facility], [address] [place], [place] [address]    Culture  Setup Time   Final    GRAM POSITIVE  COCCI IN CLUSTERS IN BOTH AEROBIC AND ANAEROBIC BOTTLES CRITICAL VALUE NOTED.  VALUE IS CONSISTENT WITH PREVIOUSLY REPORTED AND CALLED VALUE. Performed at [facility], [address] [place], [place] [address]    Culture (A)  Final    STAPHYLOCOCCUS AUREUS SUSCEPTIBILITIES PERFORMED ON PREVIOUS CULTURE WITHIN THE LAST 5 DAYS. Performed at [facility], [place] [address], [place], [place] [address]    Report Status 05/18/2018 FINAL  Final  Blood Culture ID Panel (Reflexed)     Status: Abnormal   Collection Time: 05/16/18  1:11 AM  Result Value Ref Range Status   Enterococcus species NOT DETECTED NOT DETECTED Final   Listeria monocytogenes NOT DETECTED NOT DETECTED Final   Staphylococcus species DETECTED (A) NOT DETECTED Final    Comment: CRITICAL RESULT CALLED TO, READ BACK BY AND VERIFIED WITH: Winfield Rast PATEL @1616  05/16/18 AKT    Staphylococcus aureus (BCID) DETECTED (A) NOT DETECTED Final    Comment: Methicillin (oxacillin) susceptible Staphylococcus aureus (MSSA). Preferred therapy is anti staphylococcal beta lactam antibiotic (Cefazolin or Nafcillin), unless clinically contraindicated. CRITICAL RESULT CALLED TO, READ BACK BY AND VERIFIED WITH: KISHAN PATEL @1616  05/16/18 AKT    Methicillin resistance NOT DETECTED NOT DETECTED Final   Streptococcus species NOT DETECTED NOT DETECTED Final   Streptococcus agalactiae NOT DETECTED NOT DETECTED Final   Streptococcus pneumoniae NOT DETECTED NOT DETECTED Final   Streptococcus pyogenes NOT DETECTED NOT DETECTED Final   Acinetobacter baumannii NOT DETECTED NOT DETECTED Final   Enterobacteriaceae species NOT DETECTED NOT DETECTED Final   Enterobacter cloacae complex NOT DETECTED NOT DETECTED Final   Escherichia coli NOT DETECTED NOT DETECTED Final   Klebsiella oxytoca NOT DETECTED NOT DETECTED Final   Klebsiella pneumoniae NOT DETECTED NOT DETECTED Final   Proteus species NOT DETECTED NOT DETECTED Final   Serratia marcescens  NOT DETECTED NOT DETECTED Final   Haemophilus influenzae NOT DETECTED NOT DETECTED Final   Neisseria meningitidis NOT DETECTED NOT DETECTED Final   Pseudomonas aeruginosa NOT DETECTED NOT DETECTED Final   Candida albicans NOT DETECTED NOT DETECTED Final   Candida glabrata NOT DETECTED NOT DETECTED Final   Candida krusei NOT DETECTED NOT DETECTED Final   Candida parapsilosis NOT DETECTED NOT DETECTED Final   Candida tropicalis NOT DETECTED NOT DETECTED Final    Comment: Performed at [facility], [address] [place], [place] [address]  MRSA PCR Screening     Status: None   Collection Time: 05/16/18  3:35 AM  Result Value Ref Range Status   MRSA by PCR NEGATIVE NEGATIVE Final    Comment:        The GeneXpert MRSA Assay (FDA approved for NASAL specimens only), is one component of a comprehensive MRSA colonization surveillance program. It is not intended to diagnose MRSA infection nor to guide or monitor treatment for MRSA infections. Performed at [facility], [address], [place], [place] [address]   Urine Culture     Status: None (Preliminary result)   Collection Time: 05/17/18 10:48 AM  Result Value Ref Range Status   Specimen Description   Final    URINE, RANDOM Performed at [facility], [address] [place], [place] [address]    Special Requests   Final    NONE Performed at [facility], [address] [place], [place] [address]    Culture   Final    CULTURE REINCUBATED FOR BETTER GROWTH Performed at [facility], [place] [address], [place], [place] [address]    Report Status PENDING  Incomplete  Aerobic/Anaerobic Culture (surgical/deep wound)     Status: None (Preliminary result)   Collection Time: 05/17/18 11:09 AM  Result Value Ref Range Status   Specimen Description   Final    THIGH Performed at [facility], [address] [place], [place] [address]    Special Requests   Final    NONE Performed  at [facility], [address] [place], [place] [address]    Gram Stain   Final    ABUNDANT WBC PRESENT, PREDOMINANTLY PMN ABUNDANT GRAM POSITIVE COCCI IN PAIRS IN CLUSTERS    Culture   Final    CULTURE REINCUBATED FOR BETTER GROWTH Performed at [facility], [place] [address], [place], [place] [address]    Report Status PENDING  Incomplete  CULTURE, BLOOD (ROUTINE X 2) w Reflex to ID Panel     Status: None (Preliminary result)   Collection Time: 05/18/18  4:18 AM  Result Value Ref Range Status   Specimen Description BLOOD RAC  Final   Special Requests   Final    BOTTLES DRAWN AEROBIC AND ANAEROBIC Blood Culture adequate volume   Culture   Final    NO GROWTH < 12 HOURS Performed at [facility], [address], [place], [place] [address]    Report Status PENDING  Incomplete  CULTURE, BLOOD (ROUTINE X 2) w Reflex to ID Panel     Status: None (Preliminary result)   Collection Time: 05/18/18  4:26 AM  Result Value Ref Range Status   Specimen Description BLOOD R FOREARM  Final   Special Requests   Final    BOTTLES DRAWN AEROBIC AND ANAEROBIC Blood Culture results may not be optimal due to an inadequate volume of blood received in culture bottles   Culture   Final    NO GROWTH < 12 HOURS Performed at [facility], [address] [place],  [address]    Report Status PENDING  Incomplete    IMAGING RESULTS:  I have personally reviewed the films ? Impression/Recommendation ?68 y.o. male with a history of CAD, HTN, DM  Non compliant with meds and stopped them months ago presented to the ED with SOB ? Acute hypoxic respiratory failure -likely COPD no PE or CHF. Current heavy smoker. HE does not want to Quit  Staph aureus bacteremia- source could be the rt buttock abscess, TEE this afternoon Repeat blood culture has been sent-  Culture from abscess pending. He will need IV cefazolin - duration depends on TEE, corroborative culture  .  Uncontrolled DM: non compliant with meds  HTN: management as per primary team ___________________________________________________ Discussed with patient, requesting provider

## 2018-05-18 NOTE — Progress Notes (Signed)
Patient placed back on 2L [place]

## 2018-05-18 NOTE — Progress Notes (Addendum)
Inpatient Diabetes Program Recommendations  AACE/ADA: New Consensus Statement on Inpatient Glycemic Control  Target Ranges:  Prepandial:   less than 140 mg/dL      Peak postprandial:   less than 180 mg/dL (1-2 hours)      Critically ill patients:  140 - 180 mg/dL  Results for Alexander Cobb (MRN [id number]) as of 05/18/2018 07:32  Ref. Range 05/18/2018 04:26  Glucose Latest Ref Range: 70 - 99 mg/dL 162 (H)   Results for Alexander Cobb (MRN [id number]) as of 05/18/2018 07:32  Ref. Range 05/17/2018 07:39 05/17/2018 12:01 05/17/2018 17:22 05/17/2018 21:06  Glucose-Capillary Latest Ref Range: 70 - 99 mg/dL 456 (H) 258 (H) 180 (H) 165 (H)  Results for Alexander Cobb (MRN [id number]) as of 05/18/2018 07:32  Ref. Range 06/10/2014 08:14 05/16/2018 04:08  Hemoglobin A1C Latest Ref Range: 4.8 - 5.6 % 12.8 (H) 11.9 (H)   Review of Glycemic Control  Diabetes history: DM2 Outpatient Diabetes medications: None in over 2 years (was taking insulin and Metformin in the past) Current orders for Inpatient glycemic control: Lantus 15 units QHS, Novolog 0-20 units TID with meals, Metformin 500 mg BID  Inpatient Diabetes Program Recommendations:   Correction (SSI): Please consider ordering Novolog 0-5 units QHS for bedtime correction.  Insulin - Meal Coverage: Please consider ordering Novolog 3 units TID with meals for meal coverage if patient eats at least 50% of meals.  HgbA1C: A1C 11.9% on 05/16/18 indicating an average glucose of 295 mg/dl over the past 2-3 months.  Addendum 05/18/18_0 :55-Spoke with patient about diabetes and home regimen for diabetes control. Patient reports that he has not seen prior PCP in over 1 year and that he is not planning to see Dr. [name] any longer. Patient would like to find a new PCP and has Medicare and [employer]. Consult placed for CM to assist with finding a new PCP and arranging follow up.  Patient states that he has not taken any DM medications in over 2 years but notes he use to  take insulin and Metformin but not sure of dose of either one.  Inquired about why DM medications were stopped and patient states that he was on too many medications and they were making him feel bad so he stopped them all.  Patient has not been checking glucose at home and notes that he would need to get a new glucometer and testing supplies if asked to monitor glucose.   Discussed A1C results (11.9% on 05/16/18) and explained that his current A1C indicates an average glucose of 295 mg/dl over the past 2-3 months. Discussed glucose and A1C goals. Discussed importance of checking CBGs and maintaining good CBG control to prevent long-term and short-term complications. Explained how hyperglycemia leads to damage within blood vessels which lead to the common complications seen with uncontrolled diabetes. Stressed to the patient the importance of improving glycemic control to prevent further complications from uncontrolled diabetes. Discussed impact of nutrition, exercise, stress, sickness, and medications on diabetes control. Encouraged patient to check his glucose 3-4 times per day (before meals and at bedtime) and to keep a log book of glucose readings. Explained that patient will likely need to take insulin again along with oral DM medications and patient is willing to take DM medications again.  Patient prefers to use insulin pens.  Informed patient that CM should be seeing to provide list of PCPs accepting new patients and it would be requested that he be given Rx for: glucose monitoring kit (#  [id number]), Lantus Solostar [phone]), insulin pen needles [phone]), and any other DM medications prescribed at discharge.  Patient verbalized understanding of information discussed and he states that he has no further questions at this time related to diabetes.  Thanks, Barnie Alderman, RN, MSN, CDE Diabetes Coordinator Inpatient Diabetes Program [phone] (Team Pager from 8am to 5pm)

## 2018-05-18 NOTE — Progress Notes (Signed)
    [facility] has been requested to perform a transesophageal echocardiogram on Alexander Cobb for MSSA bacteremia.  After careful review of history and examination, the risks and benefits of transesophageal echocardiogram have been explained including risks of esophageal damage, perforation (1:10,000 risk), death, bleeding, pharyngeal hematoma as well as other potential complications associated with conscious sedation including aspiration, arrhythmia, respiratory failure and death. Alternatives to treatment were discussed, questions were answered. Patient is willing to proceed.   Christell Faith, PA-C 05/18/2018 8:00 AM

## 2018-05-18 NOTE — Progress Notes (Signed)
*  PRELIMINARY RESULTS* Echocardiogram Echocardiogram Transesophageal has been performed.  Sherrie Sport 05/18/2018, 12:54 PM

## 2018-05-18 NOTE — Care Management Note (Signed)
Case Management Note  Patient Details  Name: Alexander Cobb MRN: [id number] Date of Birth: 10/05/1950   Patient admitted from home.  Found to have Staph aureus bacteremia.  Patient states that he lives at home alone.  Patient states that he is not sure if he will return to his home at discharge, or if he will be staying at a friends home. Patient is not interested in SNF at discharge. Patient will require antibiotics at home at discharge.  Patient is agreeable, and will be administering them himself. PCP is currently Dr [name].  Patient wishes to switch providers.  List provided for practices accepting new patients.  For the time being patient is going to remain at [facility] office until after IV antibiotics are complete.   CMS Medicare.gov Compare Post Acute Care list reviewed with with patient.  Patient states that he does not have a preference.  Patient states "I don't care what they say, I won't be doing antibiotics for more than 2 weeks".  Heads up referral made to [facility] with [facility].  Heads up referral to [facility] with [facility] home health  Subjective/Objective:                    Action/Plan:   Expected Discharge Date:                  Expected Discharge Plan:  [place]  In-House Referral:     Discharge planning Services  CM Consult  Post Acute Care Choice:  Durable Medical Equipment, Home Health Choice offered to:     DME Arranged:  IV pump/equipment DME Agency:     HH Arranged:  RN HH Agency:  [place]  Status of Service:  In process, will continue to follow  If discussed at Long Length of Stay Meetings, dates discussed:    Additional Comments:  Beverly Sessions, RN 05/18/2018, 3:42 PM

## 2018-05-19 ENCOUNTER — Encounter: Payer: Self-pay | Admitting: Cardiovascular Disease

## 2018-05-19 DIAGNOSIS — L989 Disorder of the skin and subcutaneous tissue, unspecified: Secondary | ICD-10-CM

## 2018-05-19 DIAGNOSIS — J9690 Respiratory failure, unspecified, unspecified whether with hypoxia or hypercapnia: Secondary | ICD-10-CM

## 2018-05-19 DIAGNOSIS — J449 Chronic obstructive pulmonary disease, unspecified: Secondary | ICD-10-CM

## 2018-05-19 LAB — GLUCOSE, CAPILLARY
Glucose-Capillary: 154 mg/dL — ABNORMAL HIGH (ref 70–99)
Glucose-Capillary: 129 mg/dL — ABNORMAL HIGH (ref 70–99)
Glucose-Capillary: 138 mg/dL — ABNORMAL HIGH (ref 70–99)
Glucose-Capillary: 127 mg/dL — ABNORMAL HIGH (ref 70–99)

## 2018-05-19 LAB — URINE CULTURE: Culture: 20000 — AB

## 2018-05-19 MED ORDER — MUPIROCIN 2 % EX OINT
TOPICAL_OINTMENT | Freq: Two times a day (BID) | CUTANEOUS | Status: DC
  Filled 2018-05-19: qty 22

## 2018-05-19 NOTE — Progress Notes (Signed)
Patient ID: Alexander Cobb, male   DOB: 06-22-1950, 68 y.o.   MRN: [id number]  [facility] PROGRESS NOTE  Alexander Cobb KGM:[id number] DOB: [birth year] DOA: 05/16/2018 PCP: [name], MD  HPI/Subjective: Patient feeling better.  Patient also having some chestwall pain from chest compressions the other day during code  Objective: Vitals:   05/19/18 0452 05/19/18 1141  BP: (!) 157/81 (!) 151/75  Pulse: 79 76  Resp: 20 16  Temp: 98.4 F (36.9 C) 98.3 F (36.8 C)  SpO2: 94% 97%    Filed Weights   05/16/18 0101 05/18/18 1155  Weight: 100 kg 100 kg    ROS: Review of Systems  Constitutional: Negative for chills and fever.  Eyes: Negative for blurred vision.  Respiratory: Positive for cough, shortness of breath and wheezing.   Cardiovascular: Positive for chest pain.  Gastrointestinal: Negative for abdominal pain, constipation, diarrhea, nausea and vomiting.  Genitourinary: Negative for dysuria.  Musculoskeletal: Positive for joint pain.  Neurological: Negative for dizziness and headaches.   Exam: Physical Exam  Constitutional: He is oriented to person, place, and time.  HENT:  Nose: No mucosal edema.  Mouth/Throat: No oropharyngeal exudate or posterior oropharyngeal edema.  Eyes: Pupils are equal, round, and reactive to light. Conjunctivae, EOM and lids are normal.  Neck: No JVD present. Carotid bruit is not present. No edema present. No thyroid mass and no thyromegaly present.  Cardiovascular: S1 normal and S2 normal. Exam reveals no gallop.  No murmur heard. Pulses:      Dorsalis pedis pulses are 2+ on the right side and 2+ on the left side.  Respiratory: No respiratory distress. He has decreased breath sounds in the right lower field and the left lower field. He has wheezes in the right middle field, the right lower field, the left middle field and the left lower field. He has no rhonchi. He has no rales.  GI: Soft. Bowel sounds are normal. There is no  abdominal tenderness.  Musculoskeletal:     Right ankle: He exhibits no swelling.     Left ankle: He exhibits no swelling.  Lymphadenopathy:    He has no cervical adenopathy.  Neurological: He is alert and oriented to person, place, and time. No cranial nerve deficit.  Skin: Skin is warm. No rash noted. Nails show no clubbing.  Psychiatric: He has a normal mood and affect.      Data Reviewed: Basic Metabolic Panel: Recent Labs  Lab 05/16/18 0111 05/16/18 0408 05/17/18 0527 05/18/18 0426  NA 131*  --  128* 135  K 3.7  --  4.3 4.0  CL 96*  --  96* 102  CO2 20*  --  21* 25  GLUCOSE 448*  --  466* 162*  BUN 12  --  27* 30*  CREATININE 1.16  --  1.31* 1.07  CALCIUM 8.3*  --  8.7* 8.5*  MG  --  2.0  --   --   PHOS  --  4.1  --   --    Liver Function Tests: Recent Labs  Lab 05/16/18 0111  AST 21  ALT 16  ALKPHOS 64  BILITOT 0.9  PROT 7.7  ALBUMIN 3.8   CBC: Recent Labs  Lab 05/16/18 0111 05/17/18 0527 05/18/18 0426  WBC 16.1* 12.3* 13.4*  NEUTROABS 8.7*  --   --   HGB 15.7 13.9 14.3  HCT 49.3 40.5 42.3  MCV 87.4 82.7 82.5  PLT 231 175 198   Cardiac Enzymes: Recent  Labs  Lab 05/16/18 0111 05/16/18 0613 05/16/18 1402 05/16/18 1853 05/17/18 0527  TROPONINI 0.03* 0.15* 0.18* 0.15* 0.07*   CBG: Recent Labs  Lab 05/18/18 1116 05/18/18 1711 05/18/18 2130 05/19/18 0810 05/19/18 1140  GLUCAP 98 105* 178* 154* 129*    Recent Results (from the past 240 hour(s))  Culture, blood (routine x 2)     Status: Abnormal   Collection Time: 05/16/18  1:11 AM  Result Value Ref Range Status   Specimen Description   Final    BLOOD RIGHT ANTECUBITAL Performed at [facility], [address], [place], [place] [address]    Special Requests   Final    BOTTLES DRAWN AEROBIC AND ANAEROBIC Blood Culture results may not be optimal due to an excessive volume of blood received in culture bottles Performed at [facility], [address]  [place], [place] [address]    Culture  Setup Time   Final    GRAM POSITIVE COCCI IN CLUSTERS IN BOTH AEROBIC AND ANAEROBIC BOTTLES CRITICAL RESULT CALLED TO, READ BACK BY AND VERIFIED WITH: Winfield Rast PATEL @1616  05/16/18 AKT Performed at [facility], [place] [address], [place], [place] [address]    Culture STAPHYLOCOCCUS AUREUS (A)  Final   Report Status 05/18/2018 FINAL  Final   Organism ID, Bacteria STAPHYLOCOCCUS AUREUS  Final      Susceptibility   Staphylococcus aureus - MIC*    CIPROFLOXACIN 1 SENSITIVE Sensitive     ERYTHROMYCIN <=0.25 SENSITIVE Sensitive     GENTAMICIN <=0.5 SENSITIVE Sensitive     OXACILLIN <=0.25 SENSITIVE Sensitive     TETRACYCLINE <=1 SENSITIVE Sensitive     VANCOMYCIN 1 SENSITIVE Sensitive     TRIMETH/SULFA <=10 SENSITIVE Sensitive     CLINDAMYCIN <=0.25 SENSITIVE Sensitive     RIFAMPIN <=0.5 SENSITIVE Sensitive     Inducible Clindamycin NEGATIVE Sensitive     * STAPHYLOCOCCUS AUREUS  Culture, blood (routine x 2)     Status: Abnormal   Collection Time: 05/16/18  1:11 AM  Result Value Ref Range Status   Specimen Description   Final    BLOOD LEFT ANTECUBITAL Performed at [facility], [address] [place], [place] [address]    Special Requests   Final    BOTTLES DRAWN AEROBIC AND ANAEROBIC Blood Culture adequate volume Performed at [facility], [address] [place], [place] [address]    Culture  Setup Time   Final    GRAM POSITIVE COCCI IN CLUSTERS IN BOTH AEROBIC AND ANAEROBIC BOTTLES CRITICAL VALUE NOTED.  VALUE IS CONSISTENT WITH PREVIOUSLY REPORTED AND CALLED VALUE. Performed at [facility], [address] [place], [place] [address]    Culture (A)  Final    STAPHYLOCOCCUS AUREUS SUSCEPTIBILITIES PERFORMED ON PREVIOUS CULTURE WITHIN THE LAST 5 DAYS. Performed at [facility], [place] [address] [place],  [address]    Report Status 05/18/2018 FINAL  Final  Blood Culture ID Panel (Reflexed)      Status: Abnormal   Collection Time: 05/16/18  1:11 AM  Result Value Ref Range Status   Enterococcus species NOT DETECTED NOT DETECTED Final   Listeria monocytogenes NOT DETECTED NOT DETECTED Final   Staphylococcus species DETECTED (A) NOT DETECTED Final    Comment: CRITICAL RESULT CALLED TO, READ BACK BY AND VERIFIED WITH: Winfield Rast PATEL @1616  05/16/18 AKT    Staphylococcus aureus (BCID) DETECTED (A) NOT DETECTED Final    Comment: Methicillin (oxacillin) susceptible Staphylococcus aureus (MSSA). Preferred therapy  is anti staphylococcal beta lactam antibiotic (Cefazolin or Nafcillin), unless clinically contraindicated. CRITICAL RESULT CALLED TO, READ BACK BY AND VERIFIED WITH: KISHAN PATEL @1616  05/16/18 AKT    Methicillin resistance NOT DETECTED NOT DETECTED Final   Streptococcus species NOT DETECTED NOT DETECTED Final   Streptococcus agalactiae NOT DETECTED NOT DETECTED Final   Streptococcus pneumoniae NOT DETECTED NOT DETECTED Final   Streptococcus pyogenes NOT DETECTED NOT DETECTED Final   Acinetobacter baumannii NOT DETECTED NOT DETECTED Final   Enterobacteriaceae species NOT DETECTED NOT DETECTED Final   Enterobacter cloacae complex NOT DETECTED NOT DETECTED Final   Escherichia coli NOT DETECTED NOT DETECTED Final   Klebsiella oxytoca NOT DETECTED NOT DETECTED Final   Klebsiella pneumoniae NOT DETECTED NOT DETECTED Final   Proteus species NOT DETECTED NOT DETECTED Final   Serratia marcescens NOT DETECTED NOT DETECTED Final   Haemophilus influenzae NOT DETECTED NOT DETECTED Final   Neisseria meningitidis NOT DETECTED NOT DETECTED Final   Pseudomonas aeruginosa NOT DETECTED NOT DETECTED Final   Candida albicans NOT DETECTED NOT DETECTED Final   Candida glabrata NOT DETECTED NOT DETECTED Final   Candida krusei NOT DETECTED NOT DETECTED Final   Candida parapsilosis NOT DETECTED NOT DETECTED Final   Candida tropicalis NOT DETECTED NOT DETECTED Final    Comment: Performed at [facility], [address] [place], [place] [address]  MRSA PCR Screening     Status: None   Collection Time: 05/16/18  3:35 AM  Result Value Ref Range Status   MRSA by PCR NEGATIVE NEGATIVE Final    Comment:        The GeneXpert MRSA Assay (FDA approved for NASAL specimens only), is one component of a comprehensive MRSA colonization surveillance program. It is not intended to diagnose MRSA infection nor to guide or monitor treatment for MRSA infections. Performed at [facility], [address], [place], [place] [address]   Urine Culture     Status: Abnormal   Collection Time: 05/17/18 10:48 AM  Result Value Ref Range Status   Specimen Description   Final    URINE, RANDOM Performed at [facility], [address] [place], [place] [address]    Special Requests   Final    NONE Performed at [facility], [place], [place] [address]    Culture 20,000 COLONIES/mL ENTEROCOCCUS FAECALIS (A)  Final   Report Status 05/19/2018 FINAL  Final   Organism ID, Bacteria ENTEROCOCCUS FAECALIS (A)  Final      Susceptibility   Enterococcus faecalis - MIC*    AMPICILLIN <=2 SENSITIVE Sensitive     LEVOFLOXACIN 0.5 SENSITIVE Sensitive     NITROFURANTOIN <=16 SENSITIVE Sensitive     VANCOMYCIN 2 SENSITIVE Sensitive     * 20,000 COLONIES/mL ENTEROCOCCUS FAECALIS  Aerobic/Anaerobic Culture (surgical/deep wound)     Status: None (Preliminary result)   Collection Time: 05/17/18 11:09 AM  Result Value Ref Range Status   Specimen Description   Final    THIGH Performed at [facility], [address], [place], [place] [address]    Special Requests   Final    NONE Performed at [facility], [address] [place], [place] [address]    Gram Stain   Final    ABUNDANT WBC PRESENT, PREDOMINANTLY PMN ABUNDANT GRAM POSITIVE COCCI IN PAIRS IN CLUSTERS Performed at [facility], [place] [address] [place],  [address]     Culture   Final  MODERATE STAPHYLOCOCCUS AUREUS CULTURE REINCUBATED FOR BETTER GROWTH NO ANAEROBES ISOLATED; CULTURE IN PROGRESS FOR 5 DAYS    Report Status PENDING  Incomplete  CULTURE, BLOOD (ROUTINE X 2) w Reflex to ID Panel     Status: None (Preliminary result)   Collection Time: 05/18/18  4:18 AM  Result Value Ref Range Status   Specimen Description BLOOD RAC  Final   Special Requests   Final    BOTTLES DRAWN AEROBIC AND ANAEROBIC Blood Culture adequate volume   Culture   Final    NO GROWTH 1 DAY Performed at [facility], [address] [place], [place] [address]    Report Status PENDING  Incomplete  CULTURE, BLOOD (ROUTINE X 2) w Reflex to ID Panel     Status: None (Preliminary result)   Collection Time: 05/18/18  4:26 AM  Result Value Ref Range Status   Specimen Description BLOOD R FOREARM  Final   Special Requests   Final    BOTTLES DRAWN AEROBIC AND ANAEROBIC Blood Culture results may not be optimal due to an inadequate volume of blood received in culture bottles   Culture   Final    NO GROWTH 1 DAY Performed at [facility], [address] [place], [place] [address]    Report Status PENDING  Incomplete      Scheduled Meds: . amLODipine  10 mg Oral Daily  . aspirin  81 mg Oral Daily  . docusate sodium  100 mg Oral BID  . enoxaparin (LOVENOX) injection  40 mg Subcutaneous Q24H  . fluticasone furoate-vilanterol  1 puff Inhalation Daily  . insulin aspart  0-20 Units Subcutaneous TID WC  . insulin glargine  15 Units Subcutaneous Q2200  . ipratropium-albuterol  3 mL Nebulization Q6H  . lisinopril  20 mg Oral Daily  . metFORMIN  500 mg Oral BID WC  . pravastatin  40 mg Oral q1800   Continuous Infusions: . sodium chloride 250 mL (05/19/18 1545)  .  ceFAZolin (ANCEF) IV 2 g (05/19/18 1548)    Assessment/Plan:  1. Staph aureus bacteremia.  Pneumonia seen on CT scan.  Likely source is abscess right buttock area.  Patient on IV Ancef.  Repeat  blood cultures drawn on 05/18/2018.  TEE negative for endocarditis.  Consult infectious disease follow-up.  Likely will need PICC line Wednesday if  cultures negative for 48 hours and a total of 2 weeks of IV antibiotics..  Buttock abscess is revealing gram-positive cocci in pairs and in clusters 2. Respiratory arrest that required CPR and intubation.  Patient extubated soon after admission.  Patient breathing comfortably on room air. 3. Acute hypoxic respiratory failure.  Patient does not wear oxygen at home.  Tapered off oxygen at this time.  Started nebulizer treatments with slight wheeze today. 4. History of CAD and cardiomyopathy. 5. Type 2 diabetes mellitus.  On glargine insulin and sliding scale.  Placed back on metformin 6. Hypertension on Norvasc 7. Hyperlipidemia unspecified on pravastatin  Code Status:     Code Status Orders  (From admission, onward)         Start     Ordered   05/16/18 0331  Full code  Continuous     05/16/18 0330        Code Status History    This patient has a current code status but no historical code status.     Family Communication: Family yesterday Disposition Plan: Hopefully home Wednesday afternoon with home health  Consultants:  Critical  care specialist  Cardiology  Infectious disease  Procedures:  CPR  Intubation and extubation  Antibiotics:  Ancef  Time spent: 27 minutes  [facility]

## 2018-05-19 NOTE — Progress Notes (Addendum)
   Date of Admission:  05/16/2018   **   ID: Alexander Cobb is a 68 y.o. male  Friends at bedside and he was okay for me to talk to him in front of them  MSSA bacteremia MSSA abscess rt buttock resp failure due to COPD/ TEE negative Repeat Blood culture from 3/9 pending Subjective: Feeling okay  Medications:  . amLODipine  10 mg Oral Daily  . aspirin  81 mg Oral Daily  . docusate sodium  100 mg Oral BID  . enoxaparin (LOVENOX) injection  40 mg Subcutaneous Q24H  . fluticasone furoate-vilanterol  1 puff Inhalation Daily  . insulin aspart  0-20 Units Subcutaneous TID WC  . insulin glargine  15 Units Subcutaneous Q2200  . ipratropium-albuterol  3 mL Nebulization Q6H  . lisinopril  20 mg Oral Daily  . metFORMIN  500 mg Oral BID WC  . pravastatin  40 mg Oral q1800    Objective: Vital signs in last 24 hours: Temp:  [98.3 F (36.8 C)-98.5 F (36.9 C)] 98.3 F (36.8 C) (03/10 1141) Pulse Rate:  [76-84] 76 (03/10 1141) Resp:  [16-20] 16 (03/10 1141) BP: (151-157)/(75-81) 151/75 (03/10 1141) SpO2:  [90 %-97 %] 97 % (03/10 1141)  PHYSICAL EXAM:  General: Alert, cooperative, no distress, appears stated age.  Head: Normocephalic, without obvious abnormality, atraumatic. Eyes: Conjunctivae clear, anicteric sclerae. Pupils are equal ENT Nares normal. No drainage or sinus tenderness. Lips, mucosa, and tongue normal. No Thrush Face rosacea like erythema, pustular eruption Neck: Supple, symmetrical, no adenopathy, thyroid: non tender no carotid bruit and no JVD. Back: No CVA tenderness. Lungs: Clear to auscultation bilaterally. No Wheezing or Rhonchi. No rales. Heart: Regular rate and rhythm, no murmur, rub or gallop. Abdomen: Soft, non-tender,not distended. Bowel sounds normal. No masses Extremities:rt buttock-not examined Skin: No rashes or lesions. Or bruising Lymph: Cervical, supraclavicular normal. Neurologic: Grossly non-focal  Lab Results Recent Labs    05/17/18 0527  05/18/18 0426  WBC 12.3* 13.4*  HGB 13.9 14.3  HCT 40.5 42.3  NA 128* 135  K 4.3 4.0  CL 96* 102  CO2 21* 25  BUN 27* 30*  CREATININE 1.31* 1.07   Liver Panel No results for input(s): PROT, ALBUMIN, AST, ALT, ALKPHOS, BILITOT, BILIDIR, IBILI in the last 72 hours. Sedimentation Rate No results for input(s): ESRSEDRATE in the last 72 hours. C-Reactive Protein No results for input(s): CRP in the last 72 hours.  Microbiology: Bdpec Asc Show Low 3/9 pending Studies/Results: No results found.   Assessment/Plan:  MSSA bacteremia- likely source rt buttock abscess TEE neg Also has lesions on his face If 3/9 blood culture remain neg can get PICC Will need 2-3 weeks of IV cefazolin  Discussed the management with the patient

## 2018-05-19 NOTE — Care Management Note (Signed)
Case Management Note  Patient Details  Name: Alexander Cobb MRN: [id number] Date of Birth: [birth year]   Notified by Malachy Mood with [facility] that patient's copay with them would be 50%  With patient's permissions referral made to [facility] with [place].  They are in network with patient's insurance.   [facility] has been at the bedside and provided hands on education    Subjective/Objective:                    Action/Plan:   Expected Discharge Date:                  Expected Discharge Plan:  [place]  In-House Referral:     Discharge planning Services  CM Consult  Post Acute Care Choice:  Durable Medical Equipment, Home Health Choice offered to:     DME Arranged:  IV pump/equipment DME Agency:     HH Arranged:  RN [place] Agency:  [place] [facility]  Status of Service:  In process, will continue to follow  If discussed at Long Length of Stay Meetings, dates discussed:    Additional Comments:  Beverly Sessions, RN 05/19/2018, 4:11 PM

## 2018-05-19 NOTE — Care Management Important Message (Signed)
Copy of signed Medicare IM left with patient in room. 

## 2018-05-20 ENCOUNTER — Inpatient Hospital Stay: Payer: Self-pay

## 2018-05-20 LAB — C-REACTIVE PROTEIN: CRP: 3.2 mg/dL — ABNORMAL HIGH (ref <1.0)

## 2018-05-20 LAB — CBC
HCT: 45.3 % (ref 39.0–52.0)
Hemoglobin: 15.4 g/dL (ref 13.0–17.0)
MCH: 28.1 pg (ref 26.0–34.0)
MCHC: 34 g/dL (ref 30.0–36.0)
MCV: 82.5 fL (ref 80.0–100.0)
Platelets: 215 10*3/uL (ref 150–400)
RBC: 5.49 MIL/uL (ref 4.22–5.81)
RDW: 12.1 % (ref 11.5–15.5)
WBC: 7.1 10*3/uL (ref 4.0–10.5)
nRBC: 0 % (ref 0.0–0.2)

## 2018-05-20 LAB — GLUCOSE, CAPILLARY
Glucose-Capillary: 107 mg/dL — ABNORMAL HIGH (ref 70–99)
Glucose-Capillary: 137 mg/dL — ABNORMAL HIGH (ref 70–99)

## 2018-05-20 LAB — SEDIMENTATION RATE: Sed Rate: 22 mm/hr — ABNORMAL HIGH (ref 0–20)

## 2018-05-20 MED ORDER — METFORMIN HCL 500 MG PO TABS: 500.0000 mg | ORAL_TABLET | Freq: Two times a day (BID) | ORAL | 1 refills | Status: DC

## 2018-05-20 MED ORDER — SODIUM CHLORIDE 0.9% FLUSH: 10.0000 mL | INTRAVENOUS | Status: DC | PRN

## 2018-05-20 MED ORDER — INSULIN PEN NEEDLE 32G X 4 MM MISC: 15.0000 [IU] | Freq: Every day | 0 refills | Status: AC

## 2018-05-20 MED ORDER — IPRATROPIUM-ALBUTEROL 0.5-2.5 (3) MG/3ML IN SOLN: 3.0000 mL | Freq: Four times a day (QID) | RESPIRATORY_TRACT | Status: DC | PRN

## 2018-05-20 MED ORDER — CEFAZOLIN IV (FOR PTA / DISCHARGE USE ONLY): 2.0000 g | Freq: Three times a day (TID) | INTRAVENOUS | 0 refills | Status: AC

## 2018-05-20 MED ORDER — ACETAMINOPHEN 325 MG PO TABS: 650.0000 mg | ORAL_TABLET | Freq: Four times a day (QID) | ORAL | Status: DC | PRN

## 2018-05-20 MED ORDER — IPRATROPIUM-ALBUTEROL 0.5-2.5 (3) MG/3ML IN SOLN
3.0000 mL | Freq: Two times a day (BID) | RESPIRATORY_TRACT | Status: DC
  Administered 2018-05-20: 3 mL via RESPIRATORY_TRACT
  Filled 2018-05-20: qty 3

## 2018-05-20 MED ORDER — LISINOPRIL 20 MG PO TABS: 20.0000 mg | ORAL_TABLET | Freq: Every day | ORAL | 0 refills | Status: DC

## 2018-05-20 MED ORDER — DOCUSATE SODIUM 100 MG PO CAPS: 100.0000 mg | ORAL_CAPSULE | Freq: Two times a day (BID) | ORAL | 0 refills | Status: DC | PRN

## 2018-05-20 MED ORDER — SODIUM CHLORIDE 0.9% FLUSH
10.0000 mL | Freq: Two times a day (BID) | INTRAVENOUS | Status: DC
  Administered 2018-05-20: 10 mL

## 2018-05-20 MED ORDER — INSULIN GLARGINE 100 UNIT/ML SOLOSTAR PEN: 15.0000 [IU] | PEN_INJECTOR | Freq: Every day | SUBCUTANEOUS | 1 refills | Status: DC

## 2018-05-20 MED ORDER — FLUTICASONE FUROATE-VILANTEROL 100-25 MCG/INH IN AEPB: 1.0000 | INHALATION_SPRAY | Freq: Every day | RESPIRATORY_TRACT | 0 refills | Status: DC

## 2018-05-20 MED ORDER — PRAVASTATIN SODIUM 40 MG PO TABS: 40.0000 mg | ORAL_TABLET | Freq: Every day | ORAL | 0 refills | Status: DC

## 2018-05-20 MED ORDER — MUPIROCIN 2 % EX OINT: TOPICAL_OINTMENT | Freq: Two times a day (BID) | CUTANEOUS | 0 refills | Status: DC

## 2018-05-20 MED ORDER — BLOOD GLUCOSE MONITOR KIT: PACK | 0 refills | Status: AC

## 2018-05-20 NOTE — Progress Notes (Signed)
Peripherally Inserted Central Catheter/Midline Placement  The IV Nurse has discussed with the patient and/or persons authorized to consent for the patient, the purpose of this procedure and the potential benefits and risks involved with this procedure.  The benefits include less needle sticks, lab draws from the catheter, and the patient may be discharged home with the catheter. Risks include, but not limited to, infection, bleeding, blood clot (thrombus formation), and puncture of an artery; nerve damage and irregular heartbeat and possibility to perform a PICC exchange if needed/ordered by physician.  Alternatives to this procedure were also discussed.  Bard Power PICC patient education guide, fact sheet on infection prevention and patient information card has been provided to patient /or left at bedside.    PICC/Midline Placement Documentation        Darlyn Read 05/20/2018, 11:05 AM

## 2018-05-20 NOTE — Treatment Plan (Addendum)
Diagnosis: MSSA bacteremia with MSSA abscess Baseline Creatinine 1.1    Allergies  Allergen Reactions  . Sulfa Drugs Cross Reactors Hives    Hives     OPAT Orders Cefazolin 2 grams IV every 8hours until 06/10/18   [facility] Care Per Protocol:  Labs weekly while on IV antibiotics: _X_ CBC with differential  _X_ CMP  Once every 2 weeks __ CRP __ ESR   X__ Please pull PIC at completion of IV antibiotics  Fax weekly labs to [phone]  Clinic Follow Up Appt:2 weeks  Call [phone] to make appt

## 2018-05-20 NOTE — Progress Notes (Signed)
Per Respiratory Protocol patient scored level 1, Frequency changed from Q6 to BID. Patient resting on room air, SAT 94%, clear bilateral breath sounds. No distress.

## 2018-05-20 NOTE — Discharge Instructions (Signed)
Follow-up with primary care physician in 3 days Follow-up with infectious disease Dr. Levester Fresh in 2 weeks  Weekly labs CBC with differential and CMP, every 2 weeks CRP and ESR.  Lab results need to be faxed over to [phone]

## 2018-05-20 NOTE — Progress Notes (Signed)
05/20/2018 3:45 PM  Alexander Cobb to be D/C'd Home per MD order.  Discussed prescriptions and follow up appointments with the patient. Prescriptions given to patient, medication list explained in detail. Pt verbalized understanding.  Allergies as of 05/20/2018      Reactions   Sulfa Drugs Cross Reactors Hives   Hives      Medication List    STOP taking these medications   glucose blood test strip Commonly known as:  OneTouch Verio   nitroGLYCERIN 0.4 MG SL tablet Commonly known as:  NITROSTAT   OneTouch Delica Lancets 58I Misc   varenicline 1 MG tablet Commonly known as:  Chantix Continuing Month Pak     TAKE these medications   acetaminophen 325 MG tablet Commonly known as:  TYLENOL Take 2 tablets (650 mg total) by mouth every 6 (six) hours as needed for mild pain (or Fever >/= 101).   amLODipine 10 MG tablet Commonly known as:  NORVASC Take 1 tablet (10 mg total) by mouth daily.   aspirin 81 MG tablet Take 81 mg by mouth daily. Reported on 06/30/2015   blood glucose meter kit and supplies Kit Dispense based on patient and insurance preference. Use up to four times daily as directed. (FOR ICD-9 250.00, 250.01).   ceFAZolin  IVPB Commonly known as:  ANCEF Inject 2 g into the vein every 8 (eight) hours for 21 days. Indication:  MSSA bacteremia and abscess Last Day of Therapy:  06/10/2018 Labs weekly while on IV antibiotics: _X_ CBC with differential _X_ CMP   docusate sodium 100 MG capsule Commonly known as:  COLACE Take 1 capsule (100 mg total) by mouth 2 (two) times daily as needed for mild constipation.   fluticasone furoate-vilanterol 100-25 MCG/INH Aepb Commonly known as:  BREO ELLIPTA Inhale 1 puff into the lungs daily. Start taking on:  May 21, 2018   Insulin Glargine 100 UNIT/ML Solostar Pen Commonly known as:  Lantus SoloStar Inject 15 Units into the skin daily. What changed:  See the new instructions.   Insulin Pen Needle 32G X 4 MM Misc 15 Units  by Does not apply route daily at 10 pm.   lisinopril 20 MG tablet Commonly known as:  PRINIVIL,ZESTRIL Take 1 tablet (20 mg total) by mouth daily.   metFORMIN 500 MG tablet Commonly known as:  Glucophage Take 1 tablet (500 mg total) by mouth 2 (two) times daily with a meal.   mupirocin ointment 2 % Commonly known as:  BACTROBAN Place into the nose 2 (two) times daily.   pravastatin 40 MG tablet Commonly known as:  PRAVACHOL Take 1 tablet (40 mg total) by mouth daily.            Home Infusion Instuctions  (From admission, onward)         Start     Ordered   05/20/18 0000  Home infusion instructions [facility] May follow [place] Dosing Protocol; May administer Cathflo as needed to maintain patency of vascular access device.; Flushing of vascular access device: per [facility] Protocol: 0.9% NaCl pre/post medica...    Question Answer Comment  Instructions May follow [place] Dosing Protocol   Instructions May administer Cathflo as needed to maintain patency of vascular access device.   Instructions Flushing of vascular access device: per [facility] Protocol: 0.9% NaCl pre/post medication administration and prn patency; Heparin 100 u/ml, 25m for implanted ports and Heparin 10u/ml, 564mfor all other central venous catheters.   Instructions May follow [facility]  Anaphylaxis Protocol for First Dose Administration in the home: 0.9% NaCl at 25-50 ml/hr to maintain IV access for protocol meds. Epinephrine 0.3 ml IV/IM PRN and Benadryl 25-50 IV/IM PRN s/s of anaphylaxis.   Instructions [facility] Infusion Coordinator (RN) to assist per patient IV care needs in the home PRN.      05/20/18 1416          Vitals:   05/20/18 0840 05/20/18 1216  BP:  (!) 161/90  Pulse:  84  Resp:  16  Temp:  99.1 F (37.3 C)  SpO2: 95% 94%    Skin clean, dry and intact without evidence of skin break down, no evidence of skin tears noted. IV catheter discontinued intact. Site without signs and  symptoms of complications. Dressing and pressure applied. Pt denies pain at this time. No complaints noted.  An After Visit Summary was printed and given to the patient. Patient escorted out and D/C home via private auto.  Dola Argyle

## 2018-05-20 NOTE — Discharge Summary (Signed)
[facility] NAME: Alexander Cobb    MR#:  [id number]  DATE OF BIRTH:  [birth year]  DATE OF ADMISSION:  05/16/2018 ADMITTING PHYSICIAN: Harrie Foreman, MD  DATE OF DISCHARGE:  05/20/18   PRIMARY CARE PHYSICIAN: [name], MD    ADMISSION DIAGNOSIS:  Acute pulmonary edema ([place]) [J81.0] Hypoxia [R09.02] Acute respiratory failure with hypoxia (HCC) [J96.01]  DISCHARGE DIAGNOSIS:  Active Problems:   Acute respiratory failure with hypoxemia (HCC)   Bacteremia due to Staphylococcus aureus MSSA abscess right buttock TEE negative  SECONDARY DIAGNOSIS:   Past Medical History:  Diagnosis Date  . CAD (coronary artery disease) 12/21/2010  . Cardiomyopathy   . Diabetes mellitus without complication ([place])   . Fluttering heart 12/21/2010  . HTN (hypertension) 12/21/2010  . Hyperlipidemia   . OSA (obstructive sleep apnea)    Uses CPAP at home    HOSPITAL COURSE:  HPI: The patient with past medical history of CAD, ischemic cardiomyopathy, hypertension, diabetes and hyperlipidemia presents to the emergency department with shortness of breath.  The patient arrived and was clearly tachypneic and labored with his breathing.  He drove himself to the hospital but was found to have oxygen saturations in the 70s.  The patient was placed in a wheelchair and transported to an exam room but became apneic in route.  CPR was initiated and the patient was intubated.  Chest x-ray showed pulmonary edema consistent with CHF in the emergency department staff called the hospitalist service for further management.  1. Staph aureus bacteremia.  Pneumonia seen on CT scan.  Likely source is abscess right buttock area.  Patient on IV Ancef.  Repeat blood cultures drawn on 05/18/2018.-No growth for 48 hours TEE negative for endocarditis.    Appreciate infectious disease follow-up.   PICC line placed on 05/20/2018 as 05/18/2018 blood cultures are negative  for 2 days and 1 dose of cefazolin was given through PICC line    Buttock abscess is revealing gram-positive cocci in pairs -MSSA.  Infectious disease is recommending cefazolin 2 g IV every 8 hours until 06/10/2018.  Weekly labs CBC with differential and CMP, every 2 weeks CRP and ESR.  Lab results need to be faxed over to [phone].  ID follow-up in 2 weeks.  Patient is to call [phone] to make an appointment Continue right buttock wound care as recommended and trained with RN 2. Respiratory arrest that required CPR and intubation.  Patient extubated soon after admission.  Patient breathing comfortably on room air. 3. Acute hypoxic respiratory failure.  Patient does not wear oxygen at home.  Tapered off oxygen at this time.   4. History of CAD and cardiomyopathy.  Outpatient follow-up with cardiology as recommended 5. Type 2 diabetes mellitus.  On glargine insulin and sliding scale.  Placed back on metformin 6. Hypertension on Norvasc 7. Hyperlipidemia unspecified on pravastatin  DISCHARGE CONDITIONS:   stable  CONSULTS OBTAINED:     PROCEDURES  PICC line placement  DRUG ALLERGIES:   Allergies  Allergen Reactions  . Sulfa Drugs Cross Reactors Hives    Hives     DISCHARGE MEDICATIONS:   Allergies as of 05/20/2018      Reactions   Sulfa Drugs Cross Reactors Hives   Hives      Medication List    STOP taking these medications   glucose blood test strip Commonly known as:  OneTouch Verio   nitroGLYCERIN 0.4 MG SL tablet Commonly known  as:  NITROSTAT   OneTouch Delica Lancets 24E Misc   varenicline 1 MG tablet Commonly known as:  Chantix Continuing Month Pak     TAKE these medications   acetaminophen 325 MG tablet Commonly known as:  TYLENOL Take 2 tablets (650 mg total) by mouth every 6 (six) hours as needed for mild pain (or Fever >/= 101).   amLODipine 10 MG tablet Commonly known as:  NORVASC Take 1 tablet (10 mg total) by mouth daily.   aspirin 81 MG  tablet Take 81 mg by mouth daily. Reported on 06/30/2015   blood glucose meter kit and supplies Kit Dispense based on patient and insurance preference. Use up to four times daily as directed. (FOR ICD-9 250.00, 250.01).   ceFAZolin  IVPB Commonly known as:  ANCEF Inject 2 g into the vein every 8 (eight) hours for 21 days. Indication:  MSSA bacteremia and abscess Last Day of Therapy:  06/10/2018 Labs weekly while on IV antibiotics: _X_ CBC with differential _X_ CMP   docusate sodium 100 MG capsule Commonly known as:  COLACE Take 1 capsule (100 mg total) by mouth 2 (two) times daily as needed for mild constipation.   fluticasone furoate-vilanterol 100-25 MCG/INH Aepb Commonly known as:  BREO ELLIPTA Inhale 1 puff into the lungs daily. Start taking on:  May 21, 2018   Insulin Glargine 100 UNIT/ML Solostar Pen Commonly known as:  Lantus SoloStar Inject 15 Units into the skin daily. What changed:  See the new instructions.   Insulin Pen Needle 32G X 4 MM Misc 15 Units by Does not apply route daily at 10 pm.   lisinopril 20 MG tablet Commonly known as:  PRINIVIL,ZESTRIL Take 1 tablet (20 mg total) by mouth daily.   metFORMIN 500 MG tablet Commonly known as:  Glucophage Take 1 tablet (500 mg total) by mouth 2 (two) times daily with a meal.   mupirocin ointment 2 % Commonly known as:  BACTROBAN Place into the nose 2 (two) times daily.   pravastatin 40 MG tablet Commonly known as:  PRAVACHOL Take 1 tablet (40 mg total) by mouth daily.            Home Infusion Instuctions  (From admission, onward)         Start     Ordered   05/20/18 0000  Home infusion instructions [facility] May follow [facility] Dosing Protocol; May administer Cathflo as needed to maintain patency of vascular access device.; Flushing of vascular access device: per [facility] Protocol: 0.9% NaCl pre/post medica...    Question Answer Comment  Instructions May follow [facility] Dosing Protocol    Instructions May administer Cathflo as needed to maintain patency of vascular access device.   Instructions Flushing of vascular access device: per [facility] Protocol: 0.9% NaCl pre/post medication administration and prn patency; Heparin 100 u/ml, 20m for implanted ports and Heparin 10u/ml, 526mfor all other central venous catheters.   Instructions May follow [facility] Anaphylaxis Protocol for First Dose Administration in the home: 0.9% NaCl at 25-50 ml/hr to maintain IV access for protocol meds. Epinephrine 0.3 ml IV/IM PRN and Benadryl 25-50 IV/IM PRN s/s of anaphylaxis.   Instructions [facility] Infusion Coordinator (RN) to assist per patient IV care needs in the home PRN.      05/20/18 1416           DISCHARGE INSTRUCTIONS:   Follow-up with primary care physician in 3 days Follow-up with infectious disease Dr. RaLevester Freshn  2 weeks  Weekly labs CBC with differential and CMP, every 2 weeks CRP and ESR.  Lab results need to be faxed over to [phone]  DIET:  Cardiac diet  DISCHARGE CONDITION:  Stable  ACTIVITY:  Activity as tolerated  OXYGEN:  Home Oxygen: No.   Oxygen Delivery: room air  DISCHARGE LOCATION:  home   If you experience worsening of your admission symptoms, develop shortness of breath, life threatening emergency, suicidal or homicidal thoughts you must seek medical attention immediately by calling 911 or calling your MD immediately  if symptoms less severe.  You Must read complete instructions/literature along with all the possible adverse reactions/side effects for all the Medicines you take and that have been prescribed to you. Take any new Medicines after you have completely understood and accpet all the possible adverse reactions/side effects.   Please note  You were cared for by a hospitalist during your hospital stay. If you have any questions about your discharge medications or the care you received while you were in the hospital after you are  discharged, you can call the unit and asked to speak with the hospitalist on call if the hospitalist that took care of you is not available. Once you are discharged, your primary care physician will handle any further medical issues. Please note that NO REFILLS for any discharge medications will be authorized once you are discharged, as it is imperative that you return to your primary care physician (or establish a relationship with a primary care physician if you do not have one) for your aftercare needs so that they can reassess your need for medications and monitor your lab values.     Today  Chief Complaint  Patient presents with  . Respiratory Distress   Patient is feeling fine denies any symptoms.  Wants to go home.  PICC line is placed on the right upper extremity  ROS:  CONSTITUTIONAL: Denies fevers, chills. Denies any fatigue, weakness.  EYES: Denies blurry vision, double vision, eye pain. EARS, NOSE, THROAT: Denies tinnitus, ear pain, hearing loss. RESPIRATORY: Denies cough, wheeze, shortness of breath.  CARDIOVASCULAR: Denies chest pain, palpitations, edema.  GASTROINTESTINAL: Denies nausea, vomiting, diarrhea, abdominal pain. Denies bright red blood per rectum. GENITOURINARY: Denies dysuria, hematuria. ENDOCRINE: Denies nocturia or thyroid problems. HEMATOLOGIC AND LYMPHATIC: Denies easy bruising or bleeding. SKIN: Right buttock wound with clean dressing MUSCULOSKELETAL: Denies pain in neck, back, shoulder, knees, hips or arthritic symptoms.  NEUROLOGIC: Denies paralysis, paresthesias.  PSYCHIATRIC: Denies anxiety or depressive symptoms.   VITAL SIGNS:  Blood pressure (!) 161/90, pulse 84, temperature 99.1 F (37.3 C), temperature source Oral, resp. rate 16, height '5\' 9"'  (1.753 m), weight 100 kg, SpO2 94 %.  I/O:    Intake/Output Summary (Last 24 hours) at 05/20/2018 1418 Last data filed at 05/20/2018 0951 Gross per 24 hour  Intake 621.8 ml  Output 225 ml  Net 396.8  ml    PHYSICAL EXAMINATION:  GENERAL:  68 y.o.-year-old patient lying in the bed with no acute distress.  EYES: Pupils equal, round, reactive to light and accommodation. No scleral icterus. Extraocular muscles intact.  HEENT: Head atraumatic, normocephalic. Oropharynx and nasopharynx clear.  Residual like erythema and pustular eruptions on the face NECK:  Supple, no jugular venous distention. No thyroid enlargement, no tenderness.  LUNGS: Normal breath sounds bilaterally, no wheezing, rales,rhonchi or crepitation. No use of accessory muscles of respiration.  CARDIOVASCULAR: S1, S2 normal. No murmurs, rubs, or gallops.  ABDOMEN: Soft, non-tender, non-distended. Bowel sounds  present.  EXTREMITIES: Right upper extremity with PICC line no pedal edema, cyanosis, or clubbing.  Right gluteal area abscess with clean dressing NEUROLOGIC: Awake, alert and oriented x3 sensation intact. Gait not checked.  PSYCHIATRIC: The patient is alert and oriented x 3.  SKIN: No obvious rash, lesion, or ulcer.   DATA REVIEW:   CBC Recent Labs  Lab 05/20/18 0908  WBC 7.1  HGB 15.4  HCT 45.3  PLT 215    Chemistries  Recent Labs  Lab 05/16/18 0111 05/16/18 0408  05/18/18 0426  NA 131*  --    < > 135  K 3.7  --    < > 4.0  CL 96*  --    < > 102  CO2 20*  --    < > 25  GLUCOSE 448*  --    < > 162*  BUN 12  --    < > 30*  CREATININE 1.16  --    < > 1.07  CALCIUM 8.3*  --    < > 8.5*  MG  --  2.0  --   --   AST 21  --   --   --   ALT 16  --   --   --   ALKPHOS 64  --   --   --   BILITOT 0.9  --   --   --    < > = values in this interval not displayed.    Cardiac Enzymes Recent Labs  Lab 05/17/18 0527  TROPONINI 0.07*    Microbiology Results  Results for orders placed or performed during the hospital encounter of 05/16/18  Culture, blood (routine x 2)     Status: Abnormal   Collection Time: 05/16/18  1:11 AM  Result Value Ref Range Status   Specimen Description   Final    BLOOD RIGHT  ANTECUBITAL Performed at [facility], [address] [place], [place] [address]    Special Requests   Final    BOTTLES DRAWN AEROBIC AND ANAEROBIC Blood Culture results may not be optimal due to an excessive volume of blood received in culture bottles Performed at [facility], [address] [place], [place] [address]    Culture  Setup Time   Final    GRAM POSITIVE COCCI IN CLUSTERS IN BOTH AEROBIC AND ANAEROBIC BOTTLES CRITICAL RESULT CALLED TO, READ BACK BY AND VERIFIED WITH: Winfield Rast PATEL '@1616'  05/16/18 AKT Performed at [facility], [place] [address], [place], [place] [address]    Culture STAPHYLOCOCCUS AUREUS (A)  Final   Report Status 05/18/2018 FINAL  Final   Organism ID, Bacteria STAPHYLOCOCCUS AUREUS  Final      Susceptibility   Staphylococcus aureus - MIC*    CIPROFLOXACIN 1 SENSITIVE Sensitive     ERYTHROMYCIN <=0.25 SENSITIVE Sensitive     GENTAMICIN <=0.5 SENSITIVE Sensitive     OXACILLIN <=0.25 SENSITIVE Sensitive     TETRACYCLINE <=1 SENSITIVE Sensitive     VANCOMYCIN 1 SENSITIVE Sensitive     TRIMETH/SULFA <=10 SENSITIVE Sensitive     CLINDAMYCIN <=0.25 SENSITIVE Sensitive     RIFAMPIN <=0.5 SENSITIVE Sensitive     Inducible Clindamycin NEGATIVE Sensitive     * STAPHYLOCOCCUS AUREUS  Culture, blood (routine x 2)     Status: Abnormal   Collection Time: 05/16/18  1:11 AM  Result Value Ref Range Status   Specimen Description   Final    BLOOD LEFT ANTECUBITAL Performed at [facility], [address], [place], [place] [address]    Special Requests   Final    BOTTLES DRAWN AEROBIC AND ANAEROBIC Blood Culture adequate volume Performed at [facility], [address] [place], [place] [address]    Culture  Setup Time   Final    GRAM POSITIVE COCCI IN CLUSTERS IN BOTH AEROBIC AND ANAEROBIC BOTTLES CRITICAL VALUE NOTED.  VALUE IS CONSISTENT WITH PREVIOUSLY REPORTED AND CALLED VALUE. Performed at [facility], [address] [place], [place] [address]    Culture (A)  Final    STAPHYLOCOCCUS AUREUS SUSCEPTIBILITIES PERFORMED ON PREVIOUS CULTURE WITHIN THE LAST 5 DAYS. Performed at [facility], [place] [address], [place], [place] [address]    Report Status 05/18/2018 FINAL  Final  Blood Culture ID Panel (Reflexed)     Status: Abnormal   Collection Time: 05/16/18  1:11 AM  Result Value Ref Range Status   Enterococcus species NOT DETECTED NOT DETECTED Final   Listeria monocytogenes NOT DETECTED NOT DETECTED Final   Staphylococcus species DETECTED (A) NOT DETECTED Final    Comment: CRITICAL RESULT CALLED TO, READ BACK BY AND VERIFIED WITH: Winfield Rast PATEL '@1616'  05/16/18 AKT    Staphylococcus aureus (BCID) DETECTED (A) NOT DETECTED Final    Comment: Methicillin (oxacillin) susceptible Staphylococcus aureus (MSSA). Preferred therapy is anti staphylococcal beta lactam antibiotic (Cefazolin or Nafcillin), unless clinically contraindicated. CRITICAL RESULT CALLED TO, READ BACK BY AND VERIFIED WITH: Winfield Rast PATEL '@1616'  05/16/18 AKT    Methicillin resistance NOT DETECTED NOT DETECTED Final   Streptococcus species NOT DETECTED NOT DETECTED Final   Streptococcus agalactiae NOT DETECTED NOT DETECTED Final   Streptococcus pneumoniae NOT DETECTED NOT DETECTED Final   Streptococcus pyogenes NOT DETECTED NOT DETECTED Final   Acinetobacter baumannii NOT DETECTED NOT DETECTED Final   Enterobacteriaceae species NOT DETECTED NOT DETECTED Final   Enterobacter cloacae complex NOT DETECTED NOT DETECTED Final   Escherichia coli NOT DETECTED NOT DETECTED Final   Klebsiella oxytoca NOT DETECTED NOT DETECTED Final   Klebsiella pneumoniae NOT DETECTED NOT DETECTED Final   Proteus species NOT DETECTED NOT DETECTED Final   Serratia marcescens NOT DETECTED NOT DETECTED Final   Haemophilus influenzae NOT DETECTED NOT DETECTED Final   Neisseria meningitidis NOT DETECTED NOT DETECTED Final   Pseudomonas aeruginosa NOT DETECTED  NOT DETECTED Final   Candida albicans NOT DETECTED NOT DETECTED Final   Candida glabrata NOT DETECTED NOT DETECTED Final   Candida krusei NOT DETECTED NOT DETECTED Final   Candida parapsilosis NOT DETECTED NOT DETECTED Final   Candida tropicalis NOT DETECTED NOT DETECTED Final    Comment: Performed at [facility], [address] [place], [place] [address]  MRSA PCR Screening     Status: None   Collection Time: 05/16/18  3:35 AM  Result Value Ref Range Status   MRSA by PCR NEGATIVE NEGATIVE Final    Comment:        The GeneXpert MRSA Assay (FDA approved for NASAL specimens only), is one component of a comprehensive MRSA colonization surveillance program. It is not intended to diagnose MRSA infection nor to guide or monitor treatment for MRSA infections. Performed at [facility], [address], [place], [place] [address]   Urine Culture     Status: Abnormal   Collection Time: 05/17/18 10:48 AM  Result Value Ref Range Status   Specimen Description   Final    URINE, RANDOM Performed at [facility], [address] [place], [place] [address]    Special Requests   Final    NONE Performed at [facility], [address] [place], [place] [address]    Culture 20,000 COLONIES/mL ENTEROCOCCUS FAECALIS (A)  Final   Report Status 05/19/2018 FINAL  Final   Organism ID, Bacteria ENTEROCOCCUS FAECALIS (A)  Final      Susceptibility   Enterococcus faecalis - MIC*    AMPICILLIN <=2 SENSITIVE Sensitive     LEVOFLOXACIN 0.5 SENSITIVE Sensitive     NITROFURANTOIN <=16 SENSITIVE Sensitive     VANCOMYCIN 2 SENSITIVE Sensitive     * 20,000 COLONIES/mL ENTEROCOCCUS FAECALIS  Aerobic/Anaerobic Culture (surgical/deep wound)     Status: None (Preliminary result)   Collection Time: 05/17/18 11:09 AM  Result Value Ref Range Status   Specimen Description   Final    THIGH Performed at [facility] [address], [place], [place] [address]     Special Requests   Final    NONE Performed at [facility], [address], [place], [place] [address]    Gram Stain   Final    ABUNDANT WBC PRESENT, PREDOMINANTLY PMN ABUNDANT GRAM POSITIVE COCCI IN PAIRS IN CLUSTERS Performed at [facility], [place] [address], [place], [place] [address]    Culture   Final    MODERATE STAPHYLOCOCCUS AUREUS MODERATE STAPHYLOCOCCUS LUGDUNENSIS NO ANAEROBES ISOLATED; CULTURE IN PROGRESS FOR 5 DAYS    Report Status PENDING  Incomplete   Organism ID, Bacteria STAPHYLOCOCCUS AUREUS  Final   Organism ID, Bacteria STAPHYLOCOCCUS LUGDUNENSIS  Final      Susceptibility   Staphylococcus aureus - MIC*    CIPROFLOXACIN 1 SENSITIVE Sensitive     ERYTHROMYCIN <=0.25 SENSITIVE Sensitive     GENTAMICIN <=0.5 SENSITIVE Sensitive     OXACILLIN 0.5 SENSITIVE Sensitive     TETRACYCLINE <=1 SENSITIVE Sensitive     VANCOMYCIN <=0.5 SENSITIVE Sensitive     TRIMETH/SULFA <=10 SENSITIVE Sensitive     CLINDAMYCIN <=0.25 SENSITIVE Sensitive     RIFAMPIN <=0.5 SENSITIVE Sensitive     Inducible Clindamycin NEGATIVE Sensitive     * MODERATE STAPHYLOCOCCUS AUREUS   Staphylococcus lugdunensis - MIC*    CIPROFLOXACIN <=0.5 SENSITIVE Sensitive     ERYTHROMYCIN <=0.25 SENSITIVE Sensitive     GENTAMICIN <=0.5 SENSITIVE Sensitive     OXACILLIN 2 SENSITIVE Sensitive     TETRACYCLINE <=1 SENSITIVE Sensitive     VANCOMYCIN <=0.5 SENSITIVE Sensitive     TRIMETH/SULFA <=10 SENSITIVE Sensitive     CLINDAMYCIN <=0.25 SENSITIVE Sensitive     RIFAMPIN <=0.5 SENSITIVE Sensitive     Inducible Clindamycin NEGATIVE Sensitive     * MODERATE STAPHYLOCOCCUS LUGDUNENSIS  CULTURE, BLOOD (ROUTINE X 2) w Reflex to ID Panel     Status: None (Preliminary result)   Collection Time: 05/18/18  4:18 AM  Result Value Ref Range Status   Specimen Description BLOOD RAC  Final   Special Requests   Final    BOTTLES DRAWN AEROBIC AND ANAEROBIC Blood Culture adequate volume   Culture    Final    NO GROWTH 2 DAYS Performed at [facility], [address] [place],  [address]    Report Status PENDING  Incomplete  CULTURE, BLOOD (ROUTINE X 2) w Reflex to ID Panel     Status: None (Preliminary result)   Collection Time: 05/18/18  4:26 AM  Result Value Ref Range Status   Specimen Description BLOOD R FOREARM  Final  Special Requests   Final    BOTTLES DRAWN AEROBIC AND ANAEROBIC Blood Culture results may not be optimal due to an inadequate volume of blood received in culture bottles   Culture   Final    NO GROWTH 2 DAYS Performed at [facility], [address], [place], [place] [address]    Report Status PENDING  Incomplete    RADIOLOGY:  [place] Ekg Site Rite  Result Date: 05/20/2018 If Site Rite image not attached, placement could not be confirmed due to current cardiac rhythm.   EKG:   Orders placed or performed during the hospital encounter of 05/16/18  . EKG 12-Lead  . EKG 12-Lead      Management plans discussed with the patient, daughter and they are in agreement.  CODE STATUS:     Code Status Orders  (From admission, onward)         Start     Ordered   05/16/18 0331  Full code  Continuous     05/16/18 0330        Code Status History    This patient has a current code status but no historical code status.      TOTAL TIME TAKING CARE OF THIS PATIENT:  45 minutes.   Note: This dictation was prepared with Dragon dictation along with smaller phrase technology. Any transcriptional errors that result from this process are unintentional.   '@MEC' @  on 05/20/2018 at 2:18 PM  Between 7am to 6pm - Pager - [phone]  After 6pm go to [email or web address] - password [email or web address] [facility]  [facility]  Office  [phone]  CC: Primary care physician; [name], MD

## 2018-05-20 NOTE — Progress Notes (Signed)
PHARMACY CONSULT NOTE FOR:  OUTPATIENT  PARENTERAL ANTIBIOTIC THERAPY (OPAT)  Indication: MSSA bacteremia and abscess Regimen: Cefazolin 2gm q8h End date: 06/10/2018  IV antibiotic discharge orders are pended. To discharging provider:  please sign these orders via discharge navigator,  Select New Orders & click on the button choice - Manage This Unsigned Work.     Thank you for allowing pharmacy to be a part of this patient's care.  Doreene Eland, PharmD, BCPS.   Work Cell: [phone] 05/20/2018 9:05 AM

## 2018-05-22 LAB — AEROBIC/ANAEROBIC CULTURE W GRAM STAIN (SURGICAL/DEEP WOUND)

## 2018-05-22 LAB — AEROBIC/ANAEROBIC CULTURE (SURGICAL/DEEP WOUND)

## 2018-05-23 LAB — CULTURE, BLOOD (ROUTINE X 2)
Culture: NO GROWTH
Culture: NO GROWTH
Special Requests: ADEQUATE

## 2018-05-26 ENCOUNTER — Encounter: Payer: Self-pay | Admitting: Infectious Diseases

## 2018-05-27 NOTE — Progress Notes (Signed)
Patient ID: Alexander Cobb, male    DOB: Oct 24, 1950, 68 y.o.   MRN: [id number]  HPI  Alexander Cobb is a 68 y/o male with a history of CAD, DM, hyperlipidemia, HTN, obstructive sleep apnea, current tobacco use and chronic heart failure.   Echo report from 05/18/2018 reviewed and showed an EF of 35-40% along with trivial Alexander.  Catheterization done 08/07/10 which shows EF of 33% along with 75% in right PDA.   Admitted 05/16/2018 due to HF exacerbation. CPR initiated and intubated short-term. Staph aureus bacteremia likely source is abscess right buttock area. ID consult obtained. Given IV antibiotics. TEE negative for endocarditis. Tapered off of oxygen after extubation. PICC line placed for antibiotics. Discharged after 4 days.   He presents today for his initial visit with a chief complaint of minimal shortness of breath upon moderate exertion. He describes this as chronic in nature having been present for several years. He has associated cough, rhinorrhea, infrequent palpitations and light-headedness along with this. He denies any difficulty sleeping, abdominal distention, pedal edema, chest pain or fatigue. Does not have scales. Currently has a PICC line in his right upper arm for antibiotics.   Past Medical History:  Diagnosis Date  . CAD (coronary artery disease) 12/21/2010  . Cardiomyopathy   . CHF (congestive heart failure) ([place])   . Diabetes mellitus without complication ([place])   . Fluttering heart 12/21/2010  . HTN (hypertension) 12/21/2010  . Hyperlipidemia   . OSA (obstructive sleep apnea)    Uses CPAP at home   Past Surgical History:  Procedure Laterality Date  . CARDIAC CATHETERIZATION    . COLONOSCOPY WITH PROPOFOL N/A 11/07/2015   Procedure: COLONOSCOPY WITH PROPOFOL;  Surgeon: Lollie Sails, MD;  Location: [facility] ENDOSCOPY;  Service: Endoscopy;  Laterality: N/A;  . TEE WITHOUT CARDIOVERSION N/A 05/18/2018   Procedure: TRANSESOPHAGEAL ECHOCARDIOGRAM (TEE);  Surgeon: Wellington Hampshire, MD;  Location: [facility] ORS;  Service: Cardiovascular;  Laterality: N/A;   Family History  Problem Relation Age of Onset  . COPD Father    Social History   Tobacco Use  . Smoking status: Current Every Day Smoker    Packs/day: 1.00    Years: 40.00    Pack years: 40.00    Types: Cigarettes  . Smokeless tobacco: Never Used  Substance Use Topics  . Alcohol use: No   Allergies  Allergen Reactions  . Sulfa Drugs Cross Reactors Hives    Hives    Prior to Admission medications   Medication Sig Start Date End Date Taking? Authorizing Provider  aspirin 81 MG tablet Take 81 mg by mouth daily. Reported on 06/30/2015   Yes [provider]  blood glucose meter kit and supplies KIT Dispense based on patient and insurance preference. Use up to four times daily as directed. (FOR ICD-9 250.00, 250.01). 05/20/18  Yes Gouru, Aruna, MD  ceFAZolin (ANCEF) IVPB Inject 2 g into the vein every 8 (eight) hours for 21 days. Indication:  MSSA bacteremia and abscess Last Day of Therapy:  06/10/2018 Labs weekly while on IV antibiotics: _X_ CBC with differential _X_ CMP 05/20/18 06/10/18 Yes Gouru, Aruna, MD  Fluticasone-Salmeterol (ADVAIR) 250-50 MCG/DOSE AEPB Inhale 1 puff into the lungs 2 (two) times daily.   Yes [provider]  ibuprofen (ADVIL,MOTRIN) 200 MG tablet Take 200 mg by mouth every 6 (six) hours as needed for mild pain or moderate pain.   Yes [provider]  Insulin Glargine (LANTUS SOLOSTAR) 100 UNIT/ML Solostar Pen Inject  15 Units into the skin daily. 05/20/18  Yes Gouru, Illene Silver, MD  Insulin Pen Needle 32G X 4 MM MISC 15 Units by Does not apply route daily at 10 pm. 05/20/18  Yes Gouru, Aruna, MD  lisinopril (PRINIVIL,ZESTRIL) 20 MG tablet Take 1 tablet (20 mg total) by mouth daily. 05/20/18 05/20/19 Yes Gouru, Illene Silver, MD  metFORMIN (GLUCOPHAGE) 500 MG tablet Take 1 tablet (500 mg total) by mouth 2 (two) times daily with a meal. 05/20/18  Yes Gouru, Aruna, MD  pravastatin  (PRAVACHOL) 40 MG tablet Take 1 tablet (40 mg total) by mouth daily. 05/20/18  Yes Gouru, Illene Silver, MD    Review of Systems  Constitutional: Negative for appetite change and fatigue.  HENT: Positive for rhinorrhea. Negative for congestion and sore throat.   Eyes: Negative.   Respiratory: Positive for cough (productive) and shortness of breath.   Cardiovascular: Positive for palpitations (at times). Negative for chest pain and leg swelling.  Gastrointestinal: Negative for abdominal distention and abdominal pain.  Endocrine: Negative.   Genitourinary: Negative.   Musculoskeletal: Negative for back pain and neck pain.       Chest sore from CPR   Skin: Negative.   Allergic/Immunologic: Negative.   Neurological: Positive for light-headedness. Negative for dizziness.  Hematological: Negative for adenopathy. Does not bruise/bleed easily.  Psychiatric/Behavioral: Negative for dysphoric mood and sleep disturbance (CPAP with 1 pillow under head). The patient is not nervous/anxious.    Vitals:   05/28/18 0912 05/28/18 0951  BP: (!) 182/82 (!) 150/60  Pulse: 73   Resp: 18   Temp: (!) 97.4 F (36.3 C)   SpO2: 100%   Weight: 185 lb 6 oz (84.1 kg)   Height: _0  (1.753 m)    Wt Readings from Last 3 Encounters:  05/28/18 185 lb 6 oz (84.1 kg)  05/18/18 220 lb 7.4 oz (100 kg)  12/28/15 209 lb 8 oz (95 kg)   Lab Results  Component Value Date   CREATININE 1.07 05/18/2018   CREATININE 1.31 (H) 05/17/2018   CREATININE 1.16 05/16/2018    Physical Exam Vitals signs and nursing note reviewed.  Constitutional:      Appearance: Normal appearance.  HENT:     Head: Normocephalic and atraumatic.  Neck:     Musculoskeletal: Normal range of motion and neck supple.  Cardiovascular:     Rate and Rhythm: Normal rate and regular rhythm.  Pulmonary:     Effort: Pulmonary effort is normal. No respiratory distress.     Breath sounds: No wheezing or rales.  Abdominal:     General: There is no  distension.     Palpations: Abdomen is soft.  Musculoskeletal:        General: No tenderness.     Right lower leg: No edema.     Left lower leg: No edema.  Skin:    General: Skin is dry.  Neurological:     General: No focal deficit present.     Mental Status: He is alert and oriented to person, place, and time.  Psychiatric:        Mood and Affect: Mood normal.        Behavior: Behavior normal.        Thought Content: Thought content normal.    Assessment & Plan:  1: Chronic heart failure with reduced ejection fraction- - NYHA class II - euvolemic today - not weighing as he doesn't have any scales. Set of scales given to him and he was instructed to  weigh every morning after using the bathroom, write the weight down and call for an overnight weight gain of >2 pounds or a weekly weight gain of >5 pounds - not adding salt and tries to eat low sodium foods; reviewed the importance of closely following a low sodium diet and written dietary information given to him about this - will add metoprolol succinate 67m daily with titration at future visits if able - discussed changing his lisinopril to entresto as well - saw cardiology ([name] 12/28/15 - PharmD reconciled medications with the patient - says that he's received his flu & pneumonia vaccines for this season  2: HTN- - BP initially elevated (182/82) but better with recheck with a manual cuff (150/60) - adding metoprolol succinate per above - saw PCP ([name] 08/13/16 - BMP from 05/18/2018 reviewed and showed sodium 135, potassium 4.0, creatinine 1.07 and GFR >60  3: DM- - A1c 05/16/2018 was 11.9% - glucose at home this morning was 144  4: Tobacco use- - smoking 1/2 ppd and used to smoke ~ 1 ppd - complete cessation discussed for 3 minutes with him  Medication bottles were reviewed.  Return in 6 weeks or sooner for any questions/problems before then.

## 2018-05-28 ENCOUNTER — Other Ambulatory Visit: Payer: Self-pay

## 2018-05-28 ENCOUNTER — Ambulatory Visit: Payer: Medicare HMO | Attending: Family | Admitting: Family

## 2018-05-28 ENCOUNTER — Encounter: Payer: Self-pay | Admitting: Pharmacist

## 2018-05-28 ENCOUNTER — Encounter: Payer: Self-pay | Admitting: Family

## 2018-05-28 VITALS — BP 150/60 | HR 73 | Temp 97.4°F | Resp 18 | Ht 69.0 in | Wt 185.4 lb

## 2018-05-28 DIAGNOSIS — I2510 Atherosclerotic heart disease of native coronary artery without angina pectoris: Secondary | ICD-10-CM | POA: Diagnosis not present

## 2018-05-28 DIAGNOSIS — F17210 Nicotine dependence, cigarettes, uncomplicated: Secondary | ICD-10-CM | POA: Insufficient documentation

## 2018-05-28 DIAGNOSIS — Z79899 Other long term (current) drug therapy: Secondary | ICD-10-CM | POA: Insufficient documentation

## 2018-05-28 DIAGNOSIS — I5022 Chronic systolic (congestive) heart failure: Secondary | ICD-10-CM | POA: Diagnosis present

## 2018-05-28 DIAGNOSIS — E119 Type 2 diabetes mellitus without complications: Secondary | ICD-10-CM | POA: Diagnosis not present

## 2018-05-28 DIAGNOSIS — I110 Hypertensive heart disease with heart failure: Secondary | ICD-10-CM | POA: Diagnosis not present

## 2018-05-28 DIAGNOSIS — Z720 Tobacco use: Secondary | ICD-10-CM

## 2018-05-28 DIAGNOSIS — E785 Hyperlipidemia, unspecified: Secondary | ICD-10-CM | POA: Insufficient documentation

## 2018-05-28 DIAGNOSIS — Z794 Long term (current) use of insulin: Secondary | ICD-10-CM | POA: Diagnosis not present

## 2018-05-28 DIAGNOSIS — Z7982 Long term (current) use of aspirin: Secondary | ICD-10-CM | POA: Diagnosis not present

## 2018-05-28 DIAGNOSIS — G4733 Obstructive sleep apnea (adult) (pediatric): Secondary | ICD-10-CM | POA: Insufficient documentation

## 2018-05-28 DIAGNOSIS — I429 Cardiomyopathy, unspecified: Secondary | ICD-10-CM | POA: Insufficient documentation

## 2018-05-28 DIAGNOSIS — E1165 Type 2 diabetes mellitus with hyperglycemia: Secondary | ICD-10-CM

## 2018-05-28 DIAGNOSIS — I10 Essential (primary) hypertension: Secondary | ICD-10-CM

## 2018-05-28 DIAGNOSIS — Z882 Allergy status to sulfonamides status: Secondary | ICD-10-CM | POA: Insufficient documentation

## 2018-05-28 MED ORDER — METOPROLOL SUCCINATE ER 25 MG PO TB24: 25.0000 mg | ORAL_TABLET | Freq: Every day | ORAL | 5 refills | Status: DC

## 2018-05-28 NOTE — Progress Notes (Signed)
[place] - PHARMACIST COUNSELING NOTE  ADHERENCE ASSESSMENT  Adherence strategy: pill box   Do you ever forget to take your medication? '[]' Yes (1) '[x]' No (0)  Do you ever skip doses due to side effects? '[]' Yes (1) '[x]' No (0)  Do you have trouble affording your medicines? '[]' Yes (1) '[x]' No (0)  Are you ever unable to pick up your medication due to transportation difficulties? '[]' Yes (1) '[x]' No (0)  Do you ever stop taking your medications because you don't believe they are helping? '[x]' Yes (1) '[]' No (0)  Total score _1______    Recommendations given to patient about increasing adherence: None. Patient reports he stopped taking all of his medications about 2 years ago.  Guideline-Directed Medical Therapy/Evidence Based Medicine    ACE/ARB/ARNI: lisinopril 20 mg daily   Beta Blocker: none   Aldosterone Antagonist: none Diuretic: none    SUBJECTIVE   HPI: Here for initial visit to HF Clinic. He denies any swelling but does endorse shortness of breath. He is not weighing daily because he does not have a scale and "he doesn't want to know."  Past Medical History:  Diagnosis Date  . CAD (coronary artery disease) 12/21/2010  . Cardiomyopathy   . CHF (congestive heart failure) ([place])   . Diabetes mellitus without complication ([place])   . Fluttering heart 12/21/2010  . HTN (hypertension) 12/21/2010  . Hyperlipidemia   . OSA (obstructive sleep apnea)    Uses CPAP at home        OBJECTIVE    Vital signs: HR 73, BP 150/60, weight (pounds) 185.6  ECHO: Date 05/16/18, EF 30-35%    BMP Latest Ref Rng & Units 05/18/2018 05/17/2018 05/16/2018  Glucose 70 - 99 mg/dL 162(H) 466(H) 448(H)  BUN 8 - 23 mg/dL 30(H) 27(H) 12  Creatinine 0.61 - 1.24 mg/dL 1.07 1.31(H) 1.16  BUN/Creat Ratio 10 - 22 - - -  Sodium 135 - 145 mmol/L 135 128(L) 131(L)  Potassium 3.5 - 5.1 mmol/L 4.0 4.3 3.7  Chloride 98 - 111 mmol/L 102 96(L) 96(L)  CO2 22 - 32 mmol/L 25 21(L)  20(L)  Calcium 8.9 - 10.3 mg/dL 8.5(L) 8.7(L) 8.3(L)    ASSESSMENT 68 year old male with HFrEF. He was recently in the hospital for HF exacerbation and had to have CPR performed. He said he is not currently on any heart medication and wants to know why he is not on a blood thinner because he was in the past and stopped taking his medications about 2 years ago. I asked if he had atrial fibrillation or a "clot" at that time but he doesn't know. He reports that his fingers are always cold and asked if a blood thinner would help with that. He has shortness of breath, but is also a current smoker (trying to quit). His BP was significantly elevated when first taken, but did improve upon recheck to 150/60. Patient is not really able to discuss what medications he is on, but says that he is taking what was picked up from the pharmacy and has the sales slips with him. It does not appear he is taking amlodipine.   PLAN We discussed medications for HF and benefits of taking them. He is currently on lisinopril and will start taking metoprolol succinate 25 mg daily. From my chart review, I do not see where the patient was ever on anticoagulation. He has not seen his Cardiologist in 2 years. Patient was provided with a scale and counseled to  weigh daily and keep a record so that he could report any weight gain of more than 2 pounds overnight or 5 pounds in a week. He was encouraged to take his medications for the benefit of HF and HTN.    Time spent: 10 minutes  [place], Pharm.D. 05/28/2018 10:19 AM    Current Outpatient Medications:  .  aspirin 81 MG tablet, Take 81 mg by mouth daily. Reported on 06/30/2015, Disp: , Rfl:  .  blood glucose meter kit and supplies KIT, Dispense based on patient and insurance preference. Use up to four times daily as directed. (FOR ICD-9 250.00, 250.01)., Disp: 1 each, Rfl: 0 .  ceFAZolin (ANCEF) IVPB, Inject 2 g into the vein every 8 (eight) hours for 21 days.  Indication:  MSSA bacteremia and abscess Last Day of Therapy:  06/10/2018 Labs weekly while on IV antibiotics: _X_ CBC with differential _X_ CMP, Disp: 63 Units, Rfl: 0 .  Fluticasone-Salmeterol (ADVAIR) 250-50 MCG/DOSE AEPB, Inhale 1 puff into the lungs 2 (two) times daily., Disp: , Rfl:  .  ibuprofen (ADVIL,MOTRIN) 200 MG tablet, Take 200 mg by mouth every 6 (six) hours as needed for mild pain or moderate pain., Disp: , Rfl:  .  Insulin Glargine (LANTUS SOLOSTAR) 100 UNIT/ML Solostar Pen, Inject 15 Units into the skin daily., Disp: 100 mL, Rfl: 1 .  Insulin Pen Needle 32G X 4 MM MISC, 15 Units by Does not apply route daily at 10 pm., Disp: 90 each, Rfl: 0 .  lisinopril (PRINIVIL,ZESTRIL) 20 MG tablet, Take 1 tablet (20 mg total) by mouth daily., Disp: 30 tablet, Rfl: 0 .  metFORMIN (GLUCOPHAGE) 500 MG tablet, Take 1 tablet (500 mg total) by mouth 2 (two) times daily with a meal., Disp: 60 tablet, Rfl: 1 .  metoprolol succinate (TOPROL XL) 25 MG 24 hr tablet, Take 1 tablet (25 mg total) by mouth daily., Disp: 30 tablet, Rfl: 5 .  pravastatin (PRAVACHOL) 40 MG tablet, Take 1 tablet (40 mg total) by mouth daily., Disp: 90 tablet, Rfl: 0   COUNSELING POINTS/CLINICAL PEARLS Metoprolol Succinate (Goal: 200 mg once daily) Warn patient to avoid activities requiring mental alertness or coordination until drug effects are realized, as drug may cause dizziness. Tell patient planning major surgery with anesthesia to alert physician that drug is being used, as drug impairs ability of heart to respond to reflex adrenergic stimuli. Drug may cause diarrhea, fatigue, headache, or depression. Advise diabetic patient to carefully monitor blood glucose as drug may mask symptoms of hypoglycemia. Patient should take extended-release tablet with or immediately following meals. Counsel patient against sudden discontinuation of drug, as this may precipitate hypertension, angina, or myocardial infarction. In the event of a  missed dose, counsel patient to skip the missed dose and maintain a regular dosing schedule. Lisinopril (Goal: 20 to 40 mg once daily)  This drug may cause nausea, vomiting, dizziness, headache, or angioedema of face, lips, throat, or intestines.  Instruct patient to report signs/symptoms of hypotension, or a persistent cough.  Advise patient against sudden discontinuation of drug.   DRUGS TO AVOID IN HEART FAILURE  Drug or Class Mechanism  Analgesics . NSAIDs . COX-2 inhibitors . Glucocorticoids  Sodium and water retention, increased systemic vascular resistance, decreased response to diuretics   Diabetes Medications . Metformin . Thiazolidinediones o Rosiglitazone (Avandia) o Pioglitazone (Actos) . DPP4 Inhibitors o Saxagliptin (Onglyza) o Sitagliptin (Januvia)   Lactic acidosis Possible calcium channel blockade   Unknown  Antiarrhythmics . Class  I  o Flecainide o Disopyramide . Class III o Sotalol . Other o Dronedarone  Negative inotrope, proarrhythmic   Proarrhythmic, beta blockade  Negative inotrope  Antihypertensives . Alpha Blockers o Doxazosin . Calcium Channel Blockers o Diltiazem o Verapamil o Nifedipine . Central Alpha Adrenergics o Moxonidine . Peripheral Vasodilators o Minoxidil  Increases renin and aldosterone  Negative inotrope    Possible sympathetic withdrawal  Unknown  Anti-infective . Itraconazole . Amphotericin B  Negative inotrope Unknown  Hematologic . Anagrelide . Cilostazol   Possible inhibition of PD IV Inhibition of PD III causing arrhythmias  Neurologic/Psychiatric . Stimulants . Anti-Seizure Drugs o Carbamazepine o Pregabalin . Antidepressants o Tricyclics o Citalopram . Parkinsons o Bromocriptine o Pergolide o Pramipexole . Antipsychotics o Clozapine . Antimigraine o Ergotamine o Methysergide . Appetite suppressants . Bipolar o Lithium  Peripheral alpha and beta agonist activity  Negative  inotrope and chronotrope Calcium channel blockade  Negative inotrope, proarrhythmic Dose-dependent QT prolongation  Excessive serotonin activity/valvular damage Excessive serotonin activity/valvular damage Unknown  IgE mediated hypersensitivy, calcium channel blockade  Excessive serotonin activity/valvular damage Excessive serotonin activity/valvular damage Valvular damage  Direct myofibrillar degeneration, adrenergic stimulation  Antimalarials . Chloroquine . Hydroxychloroquine Intracellular inhibition of lysosomal enzymes  Urologic Agents . Alpha Blockers o Doxazosin o Prazosin o Tamsulosin o Terazosin  Increased renin and aldosterone  Adapted from Page RL, et al. "Drugs That May Cause or Exacerbate Heart Failure: A Scientific Statement from the [employer]." Circulation 2016; 810:F75-Z02. DOI: 10.1161/CIR.0000000000000426   MEDICATION ADHERENCES TIPS AND STRATEGIES 1. Taking medication as prescribed improves patient outcomes in heart failure (reduces hospitalizations, improves symptoms, increases survival) 2. Side effects of medications can be managed by decreasing doses, switching agents, stopping drugs, or adding additional therapy. Please let someone in the [facility] know if you have having bothersome side effects so we can modify your regimen. Do not alter your medication regimen without talking to [place].  3. Medication reminders can help patients remember to take drugs on time. If you are missing or forgetting doses you can try linking behaviors, using pill boxes, or an electronic reminder like an alarm on your phone or an app. Some people can also get automated phone calls as medication reminders.

## 2018-05-28 NOTE — Patient Instructions (Signed)
Continue weighing daily and call for an overnight weight gain of > 2 pounds or a weekly weight gain of >5 pounds. 

## 2018-06-02 ENCOUNTER — Encounter: Payer: Self-pay | Admitting: Infectious Diseases

## 2018-06-02 ENCOUNTER — Ambulatory Visit: Payer: Medicare HMO | Attending: Infectious Diseases | Admitting: Infectious Diseases

## 2018-06-02 ENCOUNTER — Other Ambulatory Visit: Payer: Self-pay

## 2018-06-02 VITALS — BP 169/89 | HR 95 | Temp 97.5°F | Ht 69.0 in | Wt 182.0 lb

## 2018-06-02 DIAGNOSIS — J449 Chronic obstructive pulmonary disease, unspecified: Secondary | ICD-10-CM

## 2018-06-02 DIAGNOSIS — Z881 Allergy status to other antibiotic agents status: Secondary | ICD-10-CM

## 2018-06-02 DIAGNOSIS — B9561 Methicillin susceptible Staphylococcus aureus infection as the cause of diseases classified elsewhere: Secondary | ICD-10-CM | POA: Diagnosis not present

## 2018-06-02 DIAGNOSIS — R7881 Bacteremia: Secondary | ICD-10-CM | POA: Diagnosis not present

## 2018-06-02 DIAGNOSIS — E119 Type 2 diabetes mellitus without complications: Secondary | ICD-10-CM | POA: Diagnosis not present

## 2018-06-02 DIAGNOSIS — E785 Hyperlipidemia, unspecified: Secondary | ICD-10-CM

## 2018-06-02 DIAGNOSIS — F17210 Nicotine dependence, cigarettes, uncomplicated: Secondary | ICD-10-CM

## 2018-06-02 DIAGNOSIS — Z794 Long term (current) use of insulin: Secondary | ICD-10-CM

## 2018-06-02 DIAGNOSIS — I509 Heart failure, unspecified: Secondary | ICD-10-CM

## 2018-06-02 DIAGNOSIS — I110 Hypertensive heart disease with heart failure: Secondary | ICD-10-CM

## 2018-06-02 DIAGNOSIS — Z79899 Other long term (current) drug therapy: Secondary | ICD-10-CM

## 2018-06-02 DIAGNOSIS — Z872 Personal history of diseases of the skin and subcutaneous tissue: Secondary | ICD-10-CM

## 2018-06-02 DIAGNOSIS — Z792 Long term (current) use of antibiotics: Secondary | ICD-10-CM

## 2018-06-02 DIAGNOSIS — Z8639 Personal history of other endocrine, nutritional and metabolic disease: Secondary | ICD-10-CM

## 2018-06-02 DIAGNOSIS — Z95828 Presence of other vascular implants and grafts: Secondary | ICD-10-CM

## 2018-06-02 MED ORDER — CHLORHEXIDINE GLUCONATE 4 % EX LIQD: Freq: Every day | CUTANEOUS | 0 refills | Status: DC | PRN

## 2018-06-02 NOTE — Patient Instructions (Signed)
You are here for follow up after hospital discharge for the staph aureus bacteria in your blood- You are taking IV cefazolin  Three times a day and your last dose in on 06/10/18. The [facility] line will be removed after that. Use chlorhexidine body wash and mupirocin in your nares for 7 days after completing the antibiotic

## 2018-06-02 NOTE — Progress Notes (Signed)
NAME: Alexander Cobb  DOB: Apr 01, 1950  MRN: [id number]  Date/Time: 06/02/2018 10:14 AM Follow up visit after hospital discharge Was in [facility] 3/7-3/11/20 with SOB, and COPD/CHF and was intubated with CPR. He was found to have staph aureus bacteremia and rt buttock abscess. TEE was negative. Repeat blood culture from 05/18/18 was negative. HE was discharged on Cefazolin 2 grams IV q8 until 06/10/18 He is doing well No side effects from cefazolin He is adherent to the meds- 100% compliant He does it himself ?  Past Medical History:  Diagnosis Date  . CAD (coronary artery disease) 12/21/2010  . Cardiomyopathy   . CHF (congestive heart failure) ([place])   . Diabetes mellitus without complication ([place])   . Fluttering heart 12/21/2010  . HTN (hypertension) 12/21/2010  . Hyperlipidemia   . OSA (obstructive sleep apnea)    Uses CPAP at home   hyperthyroidism Past Surgical History:  Procedure Laterality Date  . CARDIAC CATHETERIZATION    . COLONOSCOPY WITH PROPOFOL N/A 11/07/2015   Procedure: COLONOSCOPY WITH PROPOFOL;  Surgeon: Lollie Sails, MD;  Location: [facility] ENDOSCOPY;  Service: Endoscopy;  Laterality: N/A;  . TEE WITHOUT CARDIOVERSION N/A 05/18/2018   Procedure: TRANSESOPHAGEAL ECHOCARDIOGRAM (TEE);  Surgeon: Wellington Hampshire, MD;  Location: [facility] ORS;  Service: Cardiovascular;  Laterality: N/A;    Social History   Socioeconomic History  . Marital status: Legally Separated    Spouse name: Not on file  . Number of children: Not on file  . Years of education: Not on file  . Highest education level: Not on file  Occupational History  . Not on file  Social Needs  . Financial resource strain: Not on file  . Food insecurity:    Worry: Not on file    Inability: Not on file  . Transportation needs:    Medical: Not on file    Non-medical: Not on file  Tobacco Use  . Smoking status: Current Every Day Smoker    Packs/day: 1.00    Years: 40.00    Pack years: 40.00    Types: Cigarettes  .  Smokeless tobacco: Never Used  Substance and Sexual Activity  . Alcohol use: No  . Drug use: No  . Sexual activity: Not on file  Lifestyle  . Physical activity:    Days per week: Not on file    Minutes per session: Not on file  . Stress: Not on file  Relationships  . Social connections:    Talks on phone: Not on file    Gets together: Not on file    Attends religious service: Not on file    Active member of club or organization: Not on file    Attends meetings of clubs or organizations: Not on file    Relationship status: Not on file  . Intimate partner violence:    Fear of current or ex partner: Not on file    Emotionally abused: Not on file    Physically abused: Not on file    Forced sexual activity: Not on file  Other Topics Concern  . Not on file  Social History Narrative  . Not on file    Family History  Problem Relation Age of Onset  . COPD Father    Allergies  Allergen Reactions  . Sulfa Drugs Cross Reactors Hives    Hives     ? Current Outpatient Medications  Medication Sig Dispense Refill  . aspirin 81 MG tablet Take 81 mg by mouth daily. Reported  on 06/30/2015    . blood glucose meter kit and supplies KIT Dispense based on patient and insurance preference. Use up to four times daily as directed. (FOR ICD-9 250.00, 250.01). 1 each 0  . ceFAZolin (ANCEF) IVPB Inject 2 g into the vein every 8 (eight) hours for 21 days. Indication:  MSSA bacteremia and abscess Last Day of Therapy:  06/10/2018 Labs weekly while on IV antibiotics: _X_ CBC with differential _X_ CMP 63 Units 0  . fluticasone furoate-vilanterol (BREO ELLIPTA) 100-25 MCG/INH AEPB Inhale 1 puff into the lungs daily.    Marland Kitchen ibuprofen (ADVIL,MOTRIN) 200 MG tablet Take 200 mg by mouth every 6 (six) hours as needed for mild pain or moderate pain.    . Insulin Glargine (LANTUS SOLOSTAR) 100 UNIT/ML Solostar Pen Inject 15 Units into the skin daily. 100 mL 1  . Insulin Pen Needle 32G X 4 MM MISC 15 Units by  Does not apply route daily at 10 pm. 90 each 0  . lisinopril-hydrochlorothiazide (PRINZIDE,ZESTORETIC) 20-12.5 MG tablet Take 1 tablet by mouth daily.    . metFORMIN (GLUCOPHAGE) 500 MG tablet Take 1 tablet (500 mg total) by mouth 2 (two) times daily with a meal. 60 tablet 1  . metoprolol succinate (TOPROL XL) 25 MG 24 hr tablet Take 1 tablet (25 mg total) by mouth daily. 30 tablet 5  . pravastatin (PRAVACHOL) 40 MG tablet Take 1 tablet (40 mg total) by mouth daily. 90 tablet 0  . Fluticasone-Salmeterol (ADVAIR) 250-50 MCG/DOSE AEPB Inhale 1 puff into the lungs 2 (two) times daily.     No current facility-administered medications for this visit.      Abtx:  Anti-infectives (From admission, onward)   None      REVIEW OF SYSTEMS:  Const: negative fever, negative chills, negative weight loss Eyes: negative diplopia or visual changes, negative eye pain ENT: negative coryza, negative sore throat Resp: negative cough, hemoptysis, dyspnea Cards: negative for chest pain, palpitations, lower extremity edema GU: negative for frequency, dysuria and hematuria GI: Negative for abdominal pain, diarrhea, bleeding, constipation Skin: negative for rash and pruritus Heme: negative for easy bruising and gum/nose bleeding MS: negative for myalgias, arthralgias, back pain and muscle weakness Neurolo:negative for headaches, dizziness, vertigo, memory problems  Psych: negative for feelings of anxiety, depression   Allergy/Immunology- sulfa  Objective:  VITALS:  BP (!) 169/89 (BP Location: Left Arm, Patient Position: Sitting, Cuff Size: Normal)   Pulse 95   Temp (!) 97.5 F (36.4 C) (Oral)   Ht _0  (1.753 m)   Wt 182 lb (82.6 kg)   BMI 26.88 kg/m  PHYSICAL EXAM:  General: Alert, cooperative, no distress, appears stated age.  Head: Normocephalic, without obvious abnormality, atraumatic. Eyes: Conjunctivae clear, anicteric sclerae. Pupils are equal ENT Nares normal. No drainage or sinus  tenderness. Lips, mucosa, and tongue normal. No Thrush Neck: Supple, symmetrical, no adenopathy, thyroid: non tender no carotid bruit and no JVD. Back: No CVA tenderness. Lungs: Clear to auscultation bilaterally. No Wheezing or Rhonchi. No rales. Heart: Regular rate and rhythm, no murmur, rub or gallop. Abdomen: Soft, non-tender,not distended. Bowel sounds normal. No masses Extremities: rt PICC site clean atraumatic, no cyanosis. No edema. No clubbing Skin: No rashes or lesions. Or bruising Lymph: Cervical, supraclavicular normal. Neurologic: Grossly non-focal Pertinent Labs Lab Results CBC    Component Value Date/Time   WBC 7.1 05/20/2018 0908   RBC 5.49 05/20/2018 0908   HGB 15.4 05/20/2018 0908   HCT 45.3 05/20/2018 0908  PLT 215 05/20/2018 0908   MCV 82.5 05/20/2018 0908   MCH 28.1 05/20/2018 0908   MCHC 34.0 05/20/2018 0908   RDW 12.1 05/20/2018 0908   LYMPHSABS 5.6 (H) 05/16/2018 0111   MONOABS 1.2 (H) 05/16/2018 0111   EOSABS 0.3 05/16/2018 0111   BASOSABS 0.2 (H) 05/16/2018 0111    CMP Latest Ref Rng & Units 05/18/2018 05/17/2018 05/16/2018  Glucose 70 - 99 mg/dL 162(H) 466(H) 448(H)  BUN 8 - 23 mg/dL 30(H) 27(H) 12  Creatinine 0.61 - 1.24 mg/dL 1.07 1.31(H) 1.16  Sodium 135 - 145 mmol/L 135 128(L) 131(L)  Potassium 3.5 - 5.1 mmol/L 4.0 4.3 3.7  Chloride 98 - 111 mmol/L 102 96(L) 96(L)  CO2 22 - 32 mmol/L 25 21(L) 20(L)  Calcium 8.9 - 10.3 mg/dL 8.5(L) 8.7(L) 8.3(L)  Total Protein 6.5 - 8.1 g/dL - - 7.7  Total Bilirubin 0.3 - 1.2 mg/dL - - 0.9  Alkaline Phos 38 - 126 U/L - - 64  AST 15 - 41 U/L - - 21  ALT 0 - 44 U/L - - 16    ? Impression/Recommendation ? ?MSSA bacteremia- on Rx- will finish Cefazolin on 06/10/18. TEE neg, repeat blood culture from 3/9 neg Doing well Rt buttock abscess due to MSSA- has healed HE will decolonize with chlorhexidine 4% and mupirocin for 7 days after completing IV.  PICC wll be removed on 06/11/18  Acute resp failure- ?COPD  exacerbation ? CHF- was intubated overnight during his last hospital stay- doing well Uses inhalers, CPAP  HTn on lisinopril?HCTZ and metoprolol  DM- on lantus  Hyperlipidemia on pravachol H/O hyperthyroidism- says he is losing weight. HE will be seeing Dr.Oconnell tomorrow  ? ___________________________________________________ Discussed with patient Follow PRN

## 2018-06-05 ENCOUNTER — Telehealth: Payer: Self-pay | Admitting: Licensed Clinical Social Worker

## 2018-06-05 NOTE — Telephone Encounter (Signed)
Can you do one under my nmae and we will sign and send it to him next week

## 2018-06-05 NOTE — Telephone Encounter (Signed)
Patient called stating that he needs a release note to take to his work after his PICC line is removed so he can return to work. His PICC is due to be removed on 4/2.

## 2018-06-08 ENCOUNTER — Encounter: Payer: Self-pay | Admitting: Licensed Clinical Social Worker

## 2018-06-08 NOTE — Progress Notes (Signed)
To whom it may concern,  Alexander Cobb had a follow up visit on 06/02/2018 and he will be able to return to work once his PICC line is removed on 06/11/2018.     Tsosie Billing, MD

## 2018-07-09 ENCOUNTER — Ambulatory Visit: Payer: Medicare HMO | Admitting: Family

## 2018-08-12 ENCOUNTER — Telehealth: Payer: Self-pay

## 2018-08-12 NOTE — Telephone Encounter (Signed)
TELEPHONE CALL NOTE  Alexander Cobb has been deemed a candidate for a follow-up tele-health visit to limit community exposure during the Covid-19 pandemic. I spoke with the patient via phone to ensure availability of phone/video source, confirm preferred email & phone number, discuss instructions and expectations, and review consent.   I reminded Alexander Cobb to be prepared with any vital sign and/or heart rhythm information that could potentially be obtained via home monitoring, at the time of his visit.  Finally, I reminded Alexander Cobb to expect an e-mail containing a link for their video-based visit approximately 15 minutes before his visit, or alternatively, a phone call at the time of his visit if his visit is planned to be a phone encounter.  Did the patient verbally consent to treatment as below? YES  Sheniya Garciaperez L, CMA 08/12/2018 9:25 AM  CONSENT FOR TELE-HEALTH VISIT - PLEASE REVIEW  I hereby voluntarily request, consent and authorize [facility] and its employed or contracted physicians, physician assistants, nurse practitioners or other licensed health care professionals (the Practitioner), to provide me with telemedicine health care services (the "Services") as deemed necessary by the treating Practitioner. I acknowledge and consent to receive the Services by the Practitioner via telemedicine. I understand that the telemedicine visit will involve communicating with the Practitioner through telephonic communication technology and the disclosure of certain medical information by electronic transmission. I acknowledge that I have been given the opportunity to request an in-person assessment or other available alternative prior to the telemedicine visit and am voluntarily participating in the telemedicine visit.  I understand that I have the right to withhold or withdraw my consent to the use of telemedicine in the course of my care at any time, without affecting my right to  future care or treatment, and that the Practitioner or I may terminate the telemedicine visit at any time. I understand that I have the right to inspect all information obtained and/or recorded in the course of the telemedicine visit and may receive copies of available information for a reasonable fee.  I understand that some of the potential risks of receiving the Services via telemedicine include:  Marland Kitchen Delay or interruption in medical evaluation due to technological equipment failure or disruption; . Information transmitted may not be sufficient (e.g. poor resolution of images) to allow for appropriate medical decision making by the Practitioner; and/or  . In rare instances, security protocols could fail, causing a breach of personal health information.  Furthermore, I acknowledge that it is my responsibility to provide information about my medical history, conditions and care that is complete and accurate to the best of my ability. I acknowledge that Practitioner's advice, recommendations, and/or decision may be based on factors not within their control, such as incomplete or inaccurate data provided by me or lack of visual representation. I understand that the practice of medicine is not an exact science and that Practitioner makes no warranties or guarantees regarding treatment outcomes. I acknowledge that I will receive a copy of this consent concurrently upon execution via email to the email address I last provided but may also request a printed copy by calling the office of [facility].    I understand that my insurance may be billed for this visit.   I have read or had this consent read to me. . I understand the contents of this consent, which adequately explains the benefits and risks of the Services being provided via telemedicine.  Marland Kitchen  I have been provided ample opportunity to ask questions regarding this consent and the Services and have had my questions answered to my  satisfaction. . I give my informed consent for the services to be provided through the use of telemedicine in my medical care  By participating in this telemedicine visit I agree to the above.

## 2018-08-12 NOTE — Telephone Encounter (Signed)
   TELEPHONE CALL NOTE  This patient has been deemed a candidate for follow-up tele-health visit to limit community exposure during the Covid-19 pandemic. I spoke with the patient via phone to discuss instructions. The patient was advised to review the section on consent for treatment as well. The patient will receive a phone call 2-3 days prior to their E-Visit at which time consent will be verbally confirmed. A Virtual Office Visit appointment type has been scheduled for 08/13/2018 with Darylene Price FNP.  Vonda Antigua L, CMA 08/12/2018 9:24 AM

## 2018-08-13 ENCOUNTER — Other Ambulatory Visit: Payer: Self-pay

## 2018-08-13 ENCOUNTER — Ambulatory Visit: Payer: Medicare HMO | Attending: Family | Admitting: Family

## 2018-08-13 ENCOUNTER — Encounter: Payer: Self-pay | Admitting: Family

## 2018-08-13 VITALS — Wt 178.0 lb

## 2018-08-13 DIAGNOSIS — I5022 Chronic systolic (congestive) heart failure: Secondary | ICD-10-CM

## 2018-08-13 DIAGNOSIS — Z720 Tobacco use: Secondary | ICD-10-CM

## 2018-08-13 DIAGNOSIS — I10 Essential (primary) hypertension: Secondary | ICD-10-CM

## 2018-08-13 DIAGNOSIS — E119 Type 2 diabetes mellitus without complications: Secondary | ICD-10-CM

## 2018-08-13 NOTE — Progress Notes (Signed)
Virtual Visit via Telephone Note    Evaluation Performed:  Follow-up visit  This visit type was conducted due to national recommendations for restrictions regarding the COVID-19 Pandemic (e.g. social distancing).  This format is felt to be most appropriate for this patient at this time.  All issues noted in this document were discussed and addressed.  No physical exam was performed (except for noted visual exam findings with Video Visits).  Please refer to the patient's chart (MyChart message for video visits and phone note for telephone visits) for the patient's consent to telehealth for [facility]  Date:  08/13/2018   ID:  Alexander Cobb, DOB 1950/09/11, MRN [id number]  Patient Location:  [place] [address]   Provider location:   [facility] [place] [address], [place] [address]  PCP:  [name], MD  Cardiologist:  Ida Rogue, MD Electrophysiologist:  None   Chief Complaint:  fatigue  History of Present Illness:    Alexander Cobb is a 68 y.o. male who presents via audio/video conferencing for a telehealth visit today.  Patient verified DOB and address.  The patient does not have symptoms concerning for COVID-19 infection (fever, chills, cough, or new SHORTNESS OF BREATH).   Patient reports moderate fatigue upon minimal exertion. He describes this as chronic in nature having been present for several years. He has associated right ankle swelling, palpitations, cough and rhinorrhea along with this. He denies any dizziness, abdominal distention, chest pain, shortness of breath, difficulty sleeping or weight gain. Is no longer using a nebulizer. Does not have his medications with him and says that "nothing has changed".   Prior CV studies:   The following studies were reviewed today:  Echo report from 05/18/2018 reviewed and showed an EF of 35-40%.   Past Medical History:  Diagnosis Date  . CAD  (coronary artery disease) 12/21/2010  . Cardiomyopathy   . CHF (congestive heart failure) ([place])   . Diabetes mellitus without complication ([place])   . Fluttering heart 12/21/2010  . HTN (hypertension) 12/21/2010  . Hyperlipidemia   . OSA (obstructive sleep apnea)    Uses CPAP at home   Past Surgical History:  Procedure Laterality Date  . CARDIAC CATHETERIZATION    . COLONOSCOPY WITH PROPOFOL N/A 11/07/2015   Procedure: COLONOSCOPY WITH PROPOFOL;  Surgeon: Lollie Sails, MD;  Location: [facility] ENDOSCOPY;  Service: Endoscopy;  Laterality: N/A;  . TEE WITHOUT CARDIOVERSION N/A 05/18/2018   Procedure: TRANSESOPHAGEAL ECHOCARDIOGRAM (TEE);  Surgeon: Wellington Hampshire, MD;  Location: [facility] ORS;  Service: Cardiovascular;  Laterality: N/A;     Current Meds  Medication Sig  . aspirin 81 MG tablet Take 81 mg by mouth daily. Reported on 06/30/2015  . blood glucose meter kit and supplies KIT Dispense based on patient and insurance preference. Use up to four times daily as directed. (FOR ICD-9 250.00, 250.01).  . fluticasone furoate-vilanterol (BREO ELLIPTA) 100-25 MCG/INH AEPB Inhale 1 puff into the lungs daily.  . Fluticasone-Salmeterol (ADVAIR) 250-50 MCG/DOSE AEPB Inhale 1 puff into the lungs 2 (two) times daily.  Marland Kitchen ibuprofen (ADVIL,MOTRIN) 200 MG tablet Take 200 mg by mouth every 6 (six) hours as needed for mild pain or moderate pain.  . Insulin Glargine (LANTUS SOLOSTAR) 100 UNIT/ML Solostar Pen Inject 15 Units into the skin daily.  . Insulin Pen Needle 32G X 4 MM MISC 15 Units by Does not apply route daily at 10  pm.  . lisinopril-hydrochlorothiazide (PRINZIDE,ZESTORETIC) 20-12.5 MG tablet Take 1 tablet by mouth daily.  . metFORMIN (GLUCOPHAGE) 500 MG tablet Take 1 tablet (500 mg total) by mouth 2 (two) times daily with a meal.  . metoprolol succinate (TOPROL XL) 25 MG 24 hr tablet Take 1 tablet (25 mg total) by mouth daily.  . pravastatin (PRAVACHOL) 40 MG tablet Take 1 tablet (40 mg total) by  mouth daily.     Allergies:   Sulfa drugs cross reactors   Social History   Tobacco Use  . Smoking status: Current Every Day Smoker    Packs/day: 1.00    Years: 40.00    Pack years: 40.00    Types: Cigarettes  . Smokeless tobacco: Never Used  Substance Use Topics  . Alcohol use: No  . Drug use: No     Family Hx: The patient's family history includes COPD in his father.  ROS:   Please see the history of present illness.     All other systems reviewed and are negative.   Labs/Other Tests and Data Reviewed:    Recent Labs: 05/16/2018: ALT 16; Magnesium 2.0; TSH 0.012 05/18/2018: BUN 30; Creatinine, Ser 1.07; Potassium 4.0; Sodium 135 05/20/2018: Hemoglobin 15.4; Platelets 215   Recent Lipid Panel Lab Results  Component Value Date/Time   CHOL 177 06/10/2014 08:14 AM   TRIG 76 05/16/2018 04:08 AM   HDL 42 06/10/2014 08:14 AM   CHOLHDL 4.2 06/10/2014 08:14 AM   LDLCALC 118 (H) 06/10/2014 08:14 AM    Wt Readings from Last 3 Encounters:  08/13/18 178 lb (80.7 kg)  06/02/18 182 lb (82.6 kg)  05/28/18 185 lb 6 oz (84.1 kg)     Exam:    Vital Signs:  Wt 178 lb (80.7 kg)   BMI 26.29 kg/m    Well nourished, well developed male in no  acute distress.   ASSESSMENT & PLAN:    1. Chronic heart failure with reduced ejection fraction- - NYHA class III - euvolemic today based on patient's description of symptoms - weighing daily and says that his weight has been stable; reminded to call for an overnight weight gain of >2 pounds or a weekly weight gain of >5 pounds - not adding salt and trying to read food labels for sodium content - consider titrating up metoprolol and changing lisinopril to entresto in the future once COVID restrictions are eased so that we can closely monitor BP and lab work - saw cardiology [name]) 12/28/15  2: HTN- - not checking his BP at home - saw PCP [name]) 08/13/16 & has an appointment on 08/25/2018 - BMP from 06/03/2018 reviewed and showed  sodium 136, potassium 4.3, creatinine 1.1 and GFR 67  3: DM- - A1c 05/16/2018 was 11.9% - glucose at home this morning was 152  4: Tobacco use - smoking 3/4 ppd and used to smoke ~ 1 ppd - complete cessation discussed for 3 minutes with him   COVID-19 Education: The signs and symptoms of COVID-19 were discussed with the patient and how to seek care for testing (follow up with PCP or arrange E-visit).  The importance of social distancing was discussed today.  Patient Risk:   After full review of this patients clinical status, I feel that they are at least moderate risk at this time.  Time:   Today, I have spent 7 minutes with the patient with telehealth technology discussing diet, weight and symptoms to report.      Medication Adjustments/Labs and Tests  Ordered: Current medicines are reviewed at length with the patient today.  Concerns regarding medicines are outlined above.   Tests Ordered: No orders of the defined types were placed in this encounter.  Medication Changes: No orders of the defined types were placed in this encounter.   Disposition: Follow-up in 3 months or sooner for any questions/problems before then.   Signed, Alisa Graff, FNP  08/13/2018 11:12 AM    [facility]

## 2018-08-13 NOTE — Patient Instructions (Signed)
Continue weighing daily and call for an overnight weight gain of > 2 pounds or a weekly weight gain of >5 pounds. 

## 2018-08-26 ENCOUNTER — Other Ambulatory Visit: Payer: Self-pay | Admitting: Internal Medicine

## 2018-08-26 DIAGNOSIS — R270 Ataxia, unspecified: Secondary | ICD-10-CM

## 2018-09-14 ENCOUNTER — Other Ambulatory Visit: Payer: Self-pay

## 2018-09-14 ENCOUNTER — Ambulatory Visit
Admission: RE | Admit: 2018-09-14 | Discharge: 2018-09-14 | Disposition: A | Discharge status: Home / Self Care | Payer: Medicare HMO | Source: Ambulatory Visit | Attending: Internal Medicine | Admitting: Internal Medicine

## 2018-09-14 DIAGNOSIS — R270 Ataxia, unspecified: Secondary | ICD-10-CM | POA: Insufficient documentation

## 2018-09-14 MED ORDER — GADOBUTROL 1 MMOL/ML IV SOLN
7.5000 mL | Freq: Once | INTRAVENOUS | Status: AC | PRN
  Administered 2018-09-14: 7.5 mL via INTRAVENOUS

## 2018-10-01 DIAGNOSIS — Z85828 Personal history of other malignant neoplasm of skin: Secondary | ICD-10-CM | POA: Diagnosis not present

## 2018-10-01 DIAGNOSIS — D2272 Melanocytic nevi of left lower limb, including hip: Secondary | ICD-10-CM | POA: Diagnosis not present

## 2018-10-01 DIAGNOSIS — L718 Other rosacea: Secondary | ICD-10-CM | POA: Diagnosis not present

## 2018-10-01 DIAGNOSIS — D2261 Melanocytic nevi of right upper limb, including shoulder: Secondary | ICD-10-CM | POA: Diagnosis not present

## 2018-10-01 DIAGNOSIS — D2262 Melanocytic nevi of left upper limb, including shoulder: Secondary | ICD-10-CM | POA: Diagnosis not present

## 2018-10-01 DIAGNOSIS — Z08 Encounter for follow-up examination after completed treatment for malignant neoplasm: Secondary | ICD-10-CM | POA: Diagnosis not present

## 2018-10-01 DIAGNOSIS — D225 Melanocytic nevi of trunk: Secondary | ICD-10-CM | POA: Diagnosis not present

## 2018-10-01 DIAGNOSIS — B354 Tinea corporis: Secondary | ICD-10-CM | POA: Diagnosis not present

## 2018-10-01 DIAGNOSIS — D2271 Melanocytic nevi of right lower limb, including hip: Secondary | ICD-10-CM | POA: Diagnosis not present

## 2018-11-10 NOTE — Progress Notes (Signed)
Patient ID: Alexander Cobb, male    DOB: 01-07-1951, 68 y.o.   MRN: [id number]  HPI  Mr Alexander Cobb is a 68 y/o male with a history of CAD, DM, hyperlipidemia, HTN, obstructive sleep apnea, current tobacco use and chronic heart failure.   Echo report from 05/18/2018 reviewed and showed an EF of 35-40% along with trivial MR.  Catheterization done 08/07/10 which shows EF of 33% along with 75% in right PDA.   Admitted 05/16/2018 due to HF exacerbation. CPR initiated and intubated short-term. Staph aureus bacteremia likely source is abscess right buttock area. ID consult obtained. Given IV antibiotics. TEE negative for endocarditis. Tapered off of oxygen after extubation. PICC line placed for antibiotics. Discharged after 4 days.   He presents today for a follow-up visit with a chief complaint of minimal shortness of breath upon moderate exertion. He describes this as chronic in nature having been present for several years. He has associated cough, light-headedness and palpitations along with this. He denies any difficulty sleeping, abdominal distention, pedal edema, chest pain, fatigue or weight gain.   Past Medical History:  Diagnosis Date  . CAD (coronary artery disease) 12/21/2010  . Cardiomyopathy   . CHF (congestive heart failure) (Alexander Cobb)   . Diabetes mellitus without complication (Alexander Cobb)   . Fluttering heart 12/21/2010  . HTN (hypertension) 12/21/2010  . Hyperlipidemia   . OSA (obstructive sleep apnea)    Uses CPAP at home   Past Surgical History:  Procedure Laterality Date  . CARDIAC CATHETERIZATION    . COLONOSCOPY WITH PROPOFOL N/A 11/07/2015   Procedure: COLONOSCOPY WITH PROPOFOL;  Surgeon: Lollie Sails, MD;  Location: [facility] ENDOSCOPY;  Service: Endoscopy;  Laterality: N/A;  . TEE WITHOUT CARDIOVERSION N/A 05/18/2018   Procedure: TRANSESOPHAGEAL ECHOCARDIOGRAM (TEE);  Surgeon: Wellington Hampshire, MD;  Location: [facility] ORS;  Service: Cardiovascular;  Laterality: N/A;   Family History   Problem Relation Age of Onset  . COPD Father    Social History   Tobacco Use  . Smoking status: Current Every Day Smoker    Packs/day: 1.00    Years: 40.00    Pack years: 40.00    Types: Cigarettes  . Smokeless tobacco: Never Used  Substance Use Topics  . Alcohol use: No   Allergies  Allergen Reactions  . Sulfa Drugs Cross Reactors Hives    Hives    Prior to Admission medications   Medication Sig Start Date End Date Taking? Authorizing Provider  aspirin 81 MG tablet Take 81 mg by mouth daily. Reported on 06/30/2015   Yes [provider]  blood glucose meter kit and supplies KIT Dispense based on patient and insurance preference. Use up to four times daily as directed. (FOR ICD-9 250.00, 250.01). 05/20/18  Yes Gouru, Aruna, MD  chlorhexidine (HIBICLENS) 4 % external liquid Apply topically daily as needed. 06/02/18  Yes Tsosie Billing, MD  fluticasone furoate-vilanterol (BREO ELLIPTA) 100-25 MCG/INH AEPB Inhale 1 puff into the lungs daily.   Yes [provider]  Fluticasone-Salmeterol (ADVAIR) 250-50 MCG/DOSE AEPB Inhale 1 puff into the lungs 2 (two) times daily.   Yes [provider]  ibuprofen (ADVIL,MOTRIN) 200 MG tablet Take 200 mg by mouth every 6 (six) hours as needed for mild pain or moderate pain.   Yes [provider]  Insulin Glargine (LANTUS SOLOSTAR) 100 UNIT/ML Solostar Pen Inject 15 Units into the skin daily. 05/20/18  Yes Gouru, Aruna, MD  Insulin Pen Needle 32G X 4 MM MISC 15 Units by  Does not apply route daily at 10 pm. 05/20/18  Yes Gouru, Illene Silver, MD  metFORMIN (GLUCOPHAGE) 500 MG tablet Take 1 tablet (500 mg total) by mouth 2 (two) times daily with a meal. 05/20/18  Yes Gouru, Aruna, MD  metoprolol succinate (TOPROL XL) 25 MG 24 hr tablet Take 1 tablet (25 mg total) by mouth daily. 05/28/18  Yes Darylene Price A, FNP  pravastatin (PRAVACHOL) 40 MG tablet Take 1 tablet (40 mg total) by mouth daily. 05/20/18  Yes Gouru, Illene Silver, MD   lisinopril-hydrochlorothiazide (PRINZIDE,ZESTORETIC) 20-12.5 MG tablet Take 1 tablet by mouth daily. 05/27/18 08/25/18  [provider]     Review of Systems  Constitutional: Negative for appetite change and fatigue.  HENT: Positive for rhinorrhea. Negative for congestion and sore throat.   Eyes: Negative.   Respiratory: Positive for cough (productive) and shortness of breath (with moderate exertion).   Cardiovascular: Positive for palpitations (at times). Negative for chest pain and leg swelling.  Gastrointestinal: Negative for abdominal distention and abdominal pain.  Endocrine: Negative.   Genitourinary: Negative.   Musculoskeletal: Negative for back pain and neck pain.  Skin: Negative.   Allergic/Immunologic: Negative.   Neurological: Positive for light-headedness. Negative for dizziness.  Hematological: Negative for adenopathy. Does not bruise/bleed easily.  Psychiatric/Behavioral: Negative for dysphoric mood and sleep disturbance (CPAP with 1 pillow under head). The patient is not nervous/anxious.    Vitals:   11/12/18 0936  BP: (!) 164/82  Pulse: 70  Resp: 18  SpO2: 99%  Weight: 189 lb 8 oz (86 kg)  Height: _0  (1.753 m)   Wt Readings from Last 3 Encounters:  11/12/18 189 lb 8 oz (86 kg)  08/13/18 178 lb (80.7 kg)  06/02/18 182 lb (82.6 kg)   Lab Results  Component Value Date   CREATININE 1.07 05/18/2018   CREATININE 1.31 (H) 05/17/2018   CREATININE 1.16 05/16/2018    Physical Exam Vitals signs and nursing note reviewed.  Constitutional:      Appearance: Normal appearance.  HENT:     Head: Normocephalic and atraumatic.  Neck:     Musculoskeletal: Normal range of motion and neck supple.  Cardiovascular:     Rate and Rhythm: Normal rate and regular rhythm.  Pulmonary:     Effort: Pulmonary effort is normal. No respiratory distress.     Breath sounds: No wheezing or rales.  Abdominal:     General: There is no distension.     Palpations: Abdomen  is soft.  Musculoskeletal:        General: No tenderness.     Right lower leg: No edema.     Left lower leg: No edema.  Skin:    General: Skin is dry.  Neurological:     General: No focal deficit present.     Mental Status: He is alert and oriented to person, place, and time.  Psychiatric:        Mood and Affect: Mood normal.        Behavior: Behavior normal.        Thought Content: Thought content normal.    Assessment & Plan:  1: Chronic heart failure with reduced ejection fraction- - NYHA class II - euvolemic today - weighing daily; reminded to call for an overnight weight gain of >2 pounds or a weekly weight gain of >5 pounds - weight up 4 pounds from last visit 5 1/2 months ago - not adding salt and tries to eat low sodium foods; reviewed the importance of  closely following a low sodium diet  - will stop his lisinopril/HCTZ and begin entresto. He was instructed to begin entresto tomorrow morning as he hasn't taken his lisinopril yet today. He will take entresto 24/41m twice daily. Voucher given to patient so that he gets the first 30 days of entresto at no charge.  - should he have above weight gain, swelling or worsening shortness of breath, he may need a diuretic to take as needed - will get BMP at his next visit - saw cardiology ([name] 12/28/15  2: HTN- - BP elevated this morning although he says that he hasn't taken his medications yet today; starting entresto per above - saw PCP ([name] 08/25/2018 - BMP from 08/18/2018 reviewed and showed sodium 137, potassium 4.2, creatinine 1.0 and GFR 74  3: DM- - A1c 08/18/2018 was 7.2% - glucose at home has been running between 130-140's - saw endocrinology (Honor Junes 09/09/2018  4: Tobacco use- - smoking 3/4 ppd  - complete cessation discussed for 3 minutes with him   Patient did not bring his medications nor a list. Each medication was verbally reviewed with the patient and he was encouraged to bring the bottles to every  visit to confirm accuracy of list.  Return in 1 month or sooner for any questions/problems before then.

## 2018-11-12 ENCOUNTER — Ambulatory Visit: Payer: Medicare HMO | Attending: Family | Admitting: Family

## 2018-11-12 ENCOUNTER — Encounter: Payer: Self-pay | Admitting: Family

## 2018-11-12 ENCOUNTER — Other Ambulatory Visit: Payer: Self-pay

## 2018-11-12 VITALS — BP 164/82 | HR 70 | Resp 18 | Ht 69.0 in | Wt 189.5 lb

## 2018-11-12 DIAGNOSIS — I5022 Chronic systolic (congestive) heart failure: Secondary | ICD-10-CM

## 2018-11-12 DIAGNOSIS — G4733 Obstructive sleep apnea (adult) (pediatric): Secondary | ICD-10-CM | POA: Insufficient documentation

## 2018-11-12 DIAGNOSIS — Z7982 Long term (current) use of aspirin: Secondary | ICD-10-CM | POA: Insufficient documentation

## 2018-11-12 DIAGNOSIS — Z825 Family history of asthma and other chronic lower respiratory diseases: Secondary | ICD-10-CM | POA: Diagnosis not present

## 2018-11-12 DIAGNOSIS — F17210 Nicotine dependence, cigarettes, uncomplicated: Secondary | ICD-10-CM | POA: Diagnosis not present

## 2018-11-12 DIAGNOSIS — I2510 Atherosclerotic heart disease of native coronary artery without angina pectoris: Secondary | ICD-10-CM | POA: Insufficient documentation

## 2018-11-12 DIAGNOSIS — I110 Hypertensive heart disease with heart failure: Secondary | ICD-10-CM | POA: Insufficient documentation

## 2018-11-12 DIAGNOSIS — Z794 Long term (current) use of insulin: Secondary | ICD-10-CM | POA: Insufficient documentation

## 2018-11-12 DIAGNOSIS — Z882 Allergy status to sulfonamides status: Secondary | ICD-10-CM | POA: Insufficient documentation

## 2018-11-12 DIAGNOSIS — I429 Cardiomyopathy, unspecified: Secondary | ICD-10-CM | POA: Diagnosis not present

## 2018-11-12 DIAGNOSIS — Z79899 Other long term (current) drug therapy: Secondary | ICD-10-CM | POA: Diagnosis not present

## 2018-11-12 DIAGNOSIS — E119 Type 2 diabetes mellitus without complications: Secondary | ICD-10-CM | POA: Diagnosis not present

## 2018-11-12 DIAGNOSIS — I10 Essential (primary) hypertension: Secondary | ICD-10-CM

## 2018-11-12 DIAGNOSIS — E785 Hyperlipidemia, unspecified: Secondary | ICD-10-CM | POA: Insufficient documentation

## 2018-11-12 DIAGNOSIS — Z7951 Long term (current) use of inhaled steroids: Secondary | ICD-10-CM | POA: Insufficient documentation

## 2018-11-12 DIAGNOSIS — Z720 Tobacco use: Secondary | ICD-10-CM

## 2018-11-12 DIAGNOSIS — I509 Heart failure, unspecified: Secondary | ICD-10-CM | POA: Diagnosis present

## 2018-11-12 MED ORDER — SACUBITRIL-VALSARTAN 24-26 MG PO TABS: 1.0000 | ORAL_TABLET | Freq: Two times a day (BID) | ORAL | 3 refills | Status: DC

## 2018-11-12 NOTE — Patient Instructions (Addendum)
Resume weighing daily and call for an overnight weight gain of > 2 pounds or a weekly weight gain of >5 pounds.  Do not take anymore lisinopril/ HCTZ.  Starting tomorrow, begin entresto 24/26mg  by taking 1 tablet in the morning and 1 tablet in the evening.   If you get the above weight gain, swelling in legs/ abdomen or worsening shortness of breath, let us know.

## 2018-12-07 ENCOUNTER — Other Ambulatory Visit: Payer: Self-pay

## 2018-12-07 ENCOUNTER — Encounter: Payer: Self-pay | Admitting: Family

## 2018-12-07 ENCOUNTER — Ambulatory Visit: Payer: Medicare HMO | Attending: Family | Admitting: Family

## 2018-12-07 VITALS — BP 158/78 | HR 77 | Resp 18 | Ht 69.0 in | Wt 188.4 lb

## 2018-12-07 DIAGNOSIS — E785 Hyperlipidemia, unspecified: Secondary | ICD-10-CM | POA: Insufficient documentation

## 2018-12-07 DIAGNOSIS — I10 Essential (primary) hypertension: Secondary | ICD-10-CM

## 2018-12-07 DIAGNOSIS — Z7982 Long term (current) use of aspirin: Secondary | ICD-10-CM | POA: Diagnosis not present

## 2018-12-07 DIAGNOSIS — Z7984 Long term (current) use of oral hypoglycemic drugs: Secondary | ICD-10-CM | POA: Insufficient documentation

## 2018-12-07 DIAGNOSIS — I2510 Atherosclerotic heart disease of native coronary artery without angina pectoris: Secondary | ICD-10-CM | POA: Insufficient documentation

## 2018-12-07 DIAGNOSIS — I429 Cardiomyopathy, unspecified: Secondary | ICD-10-CM | POA: Insufficient documentation

## 2018-12-07 DIAGNOSIS — G4733 Obstructive sleep apnea (adult) (pediatric): Secondary | ICD-10-CM | POA: Diagnosis not present

## 2018-12-07 DIAGNOSIS — Z794 Long term (current) use of insulin: Secondary | ICD-10-CM | POA: Diagnosis not present

## 2018-12-07 DIAGNOSIS — I110 Hypertensive heart disease with heart failure: Secondary | ICD-10-CM | POA: Insufficient documentation

## 2018-12-07 DIAGNOSIS — F17210 Nicotine dependence, cigarettes, uncomplicated: Secondary | ICD-10-CM | POA: Diagnosis not present

## 2018-12-07 DIAGNOSIS — R42 Dizziness and giddiness: Secondary | ICD-10-CM | POA: Insufficient documentation

## 2018-12-07 DIAGNOSIS — R5383 Other fatigue: Secondary | ICD-10-CM | POA: Diagnosis not present

## 2018-12-07 DIAGNOSIS — R05 Cough: Secondary | ICD-10-CM | POA: Insufficient documentation

## 2018-12-07 DIAGNOSIS — Z79899 Other long term (current) drug therapy: Secondary | ICD-10-CM | POA: Diagnosis not present

## 2018-12-07 DIAGNOSIS — E119 Type 2 diabetes mellitus without complications: Secondary | ICD-10-CM | POA: Diagnosis not present

## 2018-12-07 DIAGNOSIS — R0602 Shortness of breath: Secondary | ICD-10-CM | POA: Diagnosis present

## 2018-12-07 DIAGNOSIS — R002 Palpitations: Secondary | ICD-10-CM | POA: Diagnosis not present

## 2018-12-07 DIAGNOSIS — I5022 Chronic systolic (congestive) heart failure: Secondary | ICD-10-CM

## 2018-12-07 DIAGNOSIS — Z720 Tobacco use: Secondary | ICD-10-CM

## 2018-12-07 MED ORDER — SACUBITRIL-VALSARTAN 24-26 MG PO TABS: 1.0000 | ORAL_TABLET | Freq: Two times a day (BID) | ORAL | 3 refills | Status: DC

## 2018-12-07 NOTE — Patient Instructions (Addendum)
Continue weighing daily and call for an overnight weight gain of >2 pounds or a weekly weight gain of >5 pounds.  Stop lisinopril. Start entresto 24-26mg  1 pill twice a day. Start entresto on Wednesday.

## 2018-12-07 NOTE — Progress Notes (Signed)
Patient ID: Alexander Cobb, male    DOB: [birth year], 68 y.o.   MRN: [id number]  HPI  Alexander Cobb is Cobb 68 y/o male with Cobb history of CAD, DM, hyperlipidemia, HTN, obstructive sleep apnea, current tobacco use and chronic heart failure.   Echo report from 05/18/2018 reviewed and showed an EF of 35-40% along with trivial Alexander.  Catheterization done 08/07/10 which shows EF of 33% along with 75% in right PDA.   Admitted 05/16/2018 due to HF exacerbation. CPR initiated and intubated short-term. Staph aureus bacteremia likely source is abscess right buttock area. ID consult obtained. Given IV antibiotics. TEE negative for endocarditis. Tapered off of oxygen after extubation. PICC line placed for antibiotics. Discharged after 4 days.   He presents today for Cobb follow-up visit with Cobb chief complaint of shortness of breath on moderate exertion. This is associated with fatigue, productive cough, occasional heart palpitations, and light-headedness. He denies chest pain, leg swelling, abdominal distention, and trouble sleeping. He is weighing himself every day and his weight does not go above 188 pounds at home. He was prescribed entresto 24-64m last visit however he had issues at the pharmacy getting the medication. He said that since he didn't get the entresto, he continued to take his lisinopril/hctz.  Past Medical History:  Diagnosis Date  . CAD (coronary artery disease) 12/21/2010  . Cardiomyopathy   . CHF (congestive heart failure) ([place]   . Diabetes mellitus without complication ([place]   . Fluttering heart 12/21/2010  . HTN (hypertension) 12/21/2010  . Hyperlipidemia   . OSA (obstructive sleep apnea)    Uses CPAP at home   Past Surgical History:  Procedure Laterality Date  . CARDIAC CATHETERIZATION    . COLONOSCOPY WITH PROPOFOL N/Cobb 11/07/2015   Procedure: COLONOSCOPY WITH PROPOFOL;  Surgeon: Alexander Cobb;  [facility];  Service: Endoscopy;  Laterality: N/Cobb;  . TEE WITHOUT  CARDIOVERSION N/Cobb 05/18/2018   Procedure: TRANSESOPHAGEAL ECHOCARDIOGRAM (TEE);  Surgeon: Alexander Cobb;  Location: [facility] ORS;  Service: Cardiovascular;  Laterality: N/Cobb;   Family History  Problem Relation Age of Onset  . COPD Father    Social History   Tobacco Use  . Smoking status: Current Every Day Smoker    Packs/day: 0.75    Years: 40.00    Pack years: 30.00    Types: Cigarettes  . Smokeless tobacco: Never Used  Substance Use Topics  . Alcohol use: Not Currently   Allergies  Allergen Reactions  . Sulfa Drugs Cross Reactors Hives    Hives     Prior to Admission medications   Medication Sig Start Date End Date Taking? Authorizing Provider  aspirin 81 MG tablet Take 81 mg by mouth daily. Reported on 06/30/2015   Yes Provider, Historical, Cobb  blood glucose meter kit and supplies KIT Dispense based on patient and insurance preference. Use up to four times daily as directed. (FOR ICD-9 250.00, 250.01). 05/20/18  Yes Alexander Cobb, Aruna, Cobb  chlorhexidine (HIBICLENS) 4 % external liquid Apply topically daily as needed. 06/02/18  Yes Alexander Billing Cobb  fluticasone furoate-vilanterol (BREO ELLIPTA) 100-25 MCG/INH AEPB Inhale 1 puff into the lungs daily.   Yes Provider, Historical, Cobb  Fluticasone-Salmeterol (ADVAIR) 250-50 MCG/DOSE AEPB Inhale 1 puff into the lungs 2 (two) times daily.   Yes Provider, Historical, Cobb  ibuprofen (ADVIL,MOTRIN) 200 MG tablet Take 200 mg by mouth every 6 (six) hours as needed for mild pain or moderate pain.   Yes Provider, Historical, Cobb  Insulin Glargine (LANTUS SOLOSTAR) 100 UNIT/ML Solostar Pen Inject 15 Units into the skin daily. 05/20/18  Yes Alexander Cobb, Illene Silver, Cobb  Insulin Pen Needle 32G X 4 MM MISC 15 Units by Does not apply route daily at 10 pm. 05/20/18  Yes Alexander Cobb, Illene Silver, Cobb  metFORMIN (GLUCOPHAGE) 500 MG tablet Take 1 tablet (500 mg total) by mouth 2 (two) times daily with Cobb meal. 05/20/18  Yes Alexander Cobb, Aruna, Cobb  metoprolol succinate (TOPROL XL) 25  MG 24 hr tablet Take 1 tablet (25 mg total) by mouth daily. 05/28/18  Yes Alexander Price A, FNP  pravastatin (PRAVACHOL) 40 MG tablet Take 1 tablet (40 mg total) by mouth daily. 05/20/18  Yes Alexander Cobb, Aruna, Cobb  sacubitril-valsartan (ENTRESTO) 24-26 MG Take 1 tablet by mouth 2 (two) times daily. Patient not taking: Reported on 12/07/2018 11/12/18   Alexander Graff, FNP    Review of Systems  Constitutional: Positive for fatigue. Negative for appetite change.  HENT: Positive for rhinorrhea. Negative for congestion and sore throat.   Eyes: Negative.   Respiratory: Positive for cough (productive) and shortness of breath (with moderate exertion).   Cardiovascular: Positive for palpitations (occasional fluttering). Negative for chest pain and leg swelling.  Gastrointestinal: Negative for abdominal distention and abdominal pain.  Endocrine: Negative.   Genitourinary: Negative.   Musculoskeletal: Negative for back pain and neck pain.  Skin: Negative.   Allergic/Immunologic: Negative.   Neurological: Positive for light-headedness. Negative for dizziness.  Hematological: Negative for adenopathy. Does not bruise/bleed easily.  Psychiatric/Behavioral: Negative for dysphoric mood and sleep disturbance (CPAP with 1 pillow under head). The patient is not nervous/anxious.    Vitals:   12/07/18 1501  BP: (!) 158/78  Pulse: 77  Resp: 18  SpO2: 98%   Filed Weights   12/07/18 1501  Weight: 188 lb 6 oz (85.4 kg)   Lab Results  Component Value Date   CREATININE 1.07 05/18/2018   CREATININE 1.31 (H) 05/17/2018   CREATININE 1.16 05/16/2018    Physical Exam Vitals signs and nursing note reviewed.  Constitutional:      Appearance: Normal appearance.  HENT:     Head: Normocephalic and atraumatic.  Neck:     Musculoskeletal: Normal range of motion and neck supple.  Cardiovascular:     Rate and Rhythm: Normal rate and regular rhythm.  Pulmonary:     Effort: Pulmonary effort is normal. No respiratory  distress.     Breath sounds: No wheezing or rales.  Abdominal:     General: There is no distension.     Palpations: Abdomen is soft.  Musculoskeletal:        General: No tenderness.     Right lower leg: No edema.     Left lower leg: No edema.  Skin:    General: Skin is dry.  Neurological:     General: No focal deficit present.     Mental Status: He is alert and oriented to person, place, and time.  Psychiatric:        Mood and Affect: Mood normal.        Behavior: Behavior normal.        Thought Content: Thought content normal.    Assessment & Plan:  1: Chronic heart failure with reduced ejection fraction- - NYHA class II - euvolemic today - weighing daily; reminded to call for an overnight weight gain of >2 pounds or Cobb weekly weight gain of >5 pounds - weight stable from last visit 3 weeks ago - not  adding salt and tries to eat low sodium foods; reviewed the importance of closely following Cobb low sodium diet  - will stop his lisinopril/HCTZ and begin entresto. He was instructed to begin entresto Wednesday morning since he took his lisinopril today. He will take entresto 24/53m twice daily. Voucher given to patient so that he gets the first 30 days of entresto at no charge. [facility] - should he have above weight gain, swelling or worsening shortness of breath, he may need Cobb diuretic to take as needed - will get BMP at his next visit - saw cardiology ([name] 12/28/15  2: HTN- - BP good today - saw PCP ([name] 08/25/2018 - BMP from 08/18/2018 reviewed and showed sodium 137, potassium 4.2, creatinine 1.0 and GFR 74  3: DM- - A1c 08/18/2018 was 7.2% - glucose at home yesterday 187 after eating - saw endocrinology (Honor Junes 09/09/2018  4: Tobacco use- - smoking 1 ppd  - complete cessation discussed for 3 minutes with him   Patient did not bring his medications nor Cobb list. Each medication was verbally reviewed with the patient and he was  encouraged to bring the bottles to every visit to confirm accuracy of list.  Return in 4 weeks or sooner for any questions/problems before then.

## 2018-12-30 NOTE — Progress Notes (Signed)
Patient ID: Alexander Cobb, male    DOB: Jun 04, 1950, 68 y.o.   MRN: [id number]  HPI  Alexander Cobb is a 68 y/o male with a history of CAD, DM, hyperlipidemia, HTN, obstructive sleep apnea, current tobacco use and chronic heart failure.   Echo report from 05/18/2018 reviewed and showed an EF of 35-40% along with trivial Alexander.  Catheterization done 08/07/10 which shows EF of 33% along with 75% in right PDA.   Admitted 05/16/2018 due to HF exacerbation. CPR initiated and intubated short-term. Staph aureus bacteremia likely source is abscess right buttock area. ID consult obtained. Given IV antibiotics. TEE negative for endocarditis. Tapered off of oxygen after extubation. PICC line placed for antibiotics. Discharged after 4 days.   He presents today for a follow-up visit with a chief complaint of chronic shortness of breath on exertion and fatigue. This is associated with a productive cough. He denies chest pain, leg swelling, palpitations, abdominal distention, dizziness and trouble sleeping. He started entresto last visit 1 month ago and stated he has not had heart fluttering or light-headedness since then. He is weighing himself every day and his weight fluctuates between 177-199 pounds. He does not add salt to his foods and looks at the nutrition label.   Past Medical History:  Diagnosis Date  . CAD (coronary artery disease) 12/21/2010  . Cardiomyopathy   . CHF (congestive heart failure) ([place])   . Diabetes mellitus without complication ([place])   . Fluttering heart 12/21/2010  . HTN (hypertension) 12/21/2010  . Hyperlipidemia   . OSA (obstructive sleep apnea)    Uses CPAP at home   Past Surgical History:  Procedure Laterality Date  . CARDIAC CATHETERIZATION    . COLONOSCOPY WITH PROPOFOL N/A 11/07/2015   Procedure: COLONOSCOPY WITH PROPOFOL;  Surgeon: Lollie Sails, MD;  Location: [facility] ENDOSCOPY;  Service: Endoscopy;  Laterality: N/A;  . TEE WITHOUT CARDIOVERSION N/A 05/18/2018   Procedure:  TRANSESOPHAGEAL ECHOCARDIOGRAM (TEE);  Surgeon: Wellington Hampshire, MD;  Location: [facility] ORS;  Service: Cardiovascular;  Laterality: N/A;   Family History  Problem Relation Age of Onset  . COPD Father    Social History   Tobacco Use  . Smoking status: Current Every Day Smoker    Packs/day: 0.75    Years: 40.00    Pack years: 30.00    Types: Cigarettes  . Smokeless tobacco: Never Used  Substance Use Topics  . Alcohol use: Not Currently   Allergies  Allergen Reactions  . Sulfa Drugs Cross Reactors Hives    Hives    Prior to Admission medications   Medication Sig Start Date End Date Taking? Authorizing Provider  aspirin 81 MG tablet Take 81 mg by mouth daily. Reported on 06/30/2015   Yes [provider]  blood glucose meter kit and supplies KIT Dispense based on patient and insurance preference. Use up to four times daily as directed. (FOR ICD-9 250.00, 250.01). 05/20/18  Yes Gouru, Aruna, MD  chlorhexidine (HIBICLENS) 4 % external liquid Apply topically daily as needed. 06/02/18  Yes Tsosie Billing, MD  fluticasone furoate-vilanterol (BREO ELLIPTA) 100-25 MCG/INH AEPB Inhale 1 puff into the lungs daily.   Yes [provider]  Fluticasone-Salmeterol (ADVAIR) 250-50 MCG/DOSE AEPB Inhale 1 puff into the lungs 2 (two) times daily.   Yes [provider]  ibuprofen (ADVIL,MOTRIN) 200 MG tablet Take 200 mg by mouth every 6 (six) hours as needed for mild pain or moderate pain.   Yes [provider]  Insulin Glargine (  LANTUS SOLOSTAR) 100 UNIT/ML Solostar Pen Inject 15 Units into the skin daily. 05/20/18  Yes Gouru, Illene Silver, MD  Insulin Pen Needle 32G X 4 MM MISC 15 Units by Does not apply route daily at 10 pm. 05/20/18  Yes Gouru, Illene Silver, MD  metFORMIN (GLUCOPHAGE) 500 MG tablet Take 1 tablet (500 mg total) by mouth 2 (two) times daily with a meal. 05/20/18  Yes Gouru, Aruna, MD  metoprolol succinate (TOPROL XL) 25 MG 24 hr tablet Take 1 tablet (25 mg total)  by mouth daily. 05/28/18  Yes Darylene Price A, FNP  pravastatin (PRAVACHOL) 40 MG tablet Take 1 tablet (40 mg total) by mouth daily. 05/20/18  Yes Gouru, Aruna, MD  sacubitril-valsartan (ENTRESTO) 24-26 MG Take 1 tablet by mouth 2 (two) times daily. 12/07/18  Yes Alisa Graff, FNP   Review of Systems  Constitutional: Positive for fatigue. Negative for appetite change.  HENT: Positive for rhinorrhea. Negative for congestion and sore throat.   Eyes: Negative.   Respiratory: Positive for cough (productive) and shortness of breath (with moderate exertion).   Cardiovascular: Negative for chest pain, palpitations and leg swelling.  Gastrointestinal: Negative for abdominal distention and abdominal pain.  Endocrine: Negative.   Genitourinary: Negative.   Musculoskeletal: Negative for back pain and neck pain.  Skin: Negative.   Allergic/Immunologic: Negative.   Neurological: Negative for dizziness and light-headedness.  Hematological: Negative for adenopathy. Does not bruise/bleed easily.  Psychiatric/Behavioral: Negative for dysphoric mood and sleep disturbance (CPAP with 1 pillow under head). The patient is not nervous/anxious.    Vitals:   01/04/19 1452  BP: (!) 162/86  Pulse: 91  Resp: 18  SpO2: 92%   Filed Weights   01/04/19 1452  Weight: 186 lb 12.8 oz (84.7 kg)   Lab Results  Component Value Date   CREATININE 1.07 05/18/2018   CREATININE 1.31 (H) 05/17/2018   CREATININE 1.16 05/16/2018   Physical Exam Vitals signs and nursing note reviewed.  Constitutional:      Appearance: Normal appearance.  HENT:     Head: Normocephalic and atraumatic.  Neck:     Musculoskeletal: Normal range of motion and neck supple.  Cardiovascular:     Rate and Rhythm: Normal rate and regular rhythm.  Pulmonary:     Effort: Pulmonary effort is normal. No respiratory distress.     Breath sounds: No wheezing or rales.  Abdominal:     General: There is no distension.     Palpations: Abdomen is  soft.  Musculoskeletal:        General: No tenderness.     Right lower leg: No edema.     Left lower leg: No edema.  Skin:    General: Skin is dry.  Neurological:     General: No focal deficit present.     Mental Status: He is alert and oriented to person, place, and time.  Psychiatric:        Mood and Affect: Mood normal.        Behavior: Behavior normal.        Thought Content: Thought content normal.    Assessment & Plan:  1: Chronic heart failure with reduced ejection fraction- - NYHA class II - euvolemic today - weighing daily; reminded to call for an overnight weight gain of >2 pounds or a weekly weight gain of >5 pounds - weight down 2 pounds from last visit 4 weeks ago - not adding salt and tries to eat low sodium foods; reviewed the importance of  closely following a low sodium diet  - will increase his entresto to 49-41m BID. He states the mail in pharmacy sent him 6 months worth of the initial entresto. He will take 2 tablets twice a day. When he has approximately 1 month left, he will call the office to get a new prescription for the increased dose.  - check BMP today since started entresto last visit - should he have above weight gain, swelling or worsening shortness of breath, he may need a diuretic to take as needed - will get BMP at his next visit - saw cardiology (Alexander Cobb 12/28/15, has an appointment next month  2: HTN- - BP good today - saw PCP (Alexander Cobb 08/25/2018 - BMP from 08/18/2018 reviewed and showed sodium 137, potassium 4.2, creatinine 1.0 and GFR 74  3: DM- - A1c 08/18/2018 was 7.2% - saw endocrinology (Alexander Cobb 09/09/2018  4: Tobacco use- - smoking 1 ppd  - complete cessation discussed for 3 minutes with him   Patient did not bring his medications nor a list. Each medication was verbally reviewed with the patient and he was encouraged to bring the bottles to every visit to confirm accuracy of list.  Return in 4 weeks or sooner for any  questions/problems before then.

## 2019-01-04 ENCOUNTER — Encounter
Admission: RE | Admit: 2019-01-04 | Discharge: 2019-01-04 | Disposition: A | Discharge status: Home / Self Care | Payer: Medicare HMO | Source: Ambulatory Visit | Attending: Family | Admitting: Family

## 2019-01-04 ENCOUNTER — Other Ambulatory Visit: Payer: Self-pay

## 2019-01-04 ENCOUNTER — Ambulatory Visit: Payer: Medicare HMO | Attending: Family | Admitting: Family

## 2019-01-04 ENCOUNTER — Encounter: Payer: Self-pay | Admitting: Family

## 2019-01-04 VITALS — BP 162/86 | HR 91 | Resp 18 | Ht 69.0 in | Wt 186.8 lb

## 2019-01-04 DIAGNOSIS — Z79899 Other long term (current) drug therapy: Secondary | ICD-10-CM | POA: Diagnosis not present

## 2019-01-04 DIAGNOSIS — I429 Cardiomyopathy, unspecified: Secondary | ICD-10-CM | POA: Diagnosis not present

## 2019-01-04 DIAGNOSIS — I10 Essential (primary) hypertension: Secondary | ICD-10-CM

## 2019-01-04 DIAGNOSIS — F17210 Nicotine dependence, cigarettes, uncomplicated: Secondary | ICD-10-CM | POA: Insufficient documentation

## 2019-01-04 DIAGNOSIS — Z825 Family history of asthma and other chronic lower respiratory diseases: Secondary | ICD-10-CM | POA: Insufficient documentation

## 2019-01-04 DIAGNOSIS — E785 Hyperlipidemia, unspecified: Secondary | ICD-10-CM | POA: Diagnosis not present

## 2019-01-04 DIAGNOSIS — E119 Type 2 diabetes mellitus without complications: Secondary | ICD-10-CM | POA: Insufficient documentation

## 2019-01-04 DIAGNOSIS — I2510 Atherosclerotic heart disease of native coronary artery without angina pectoris: Secondary | ICD-10-CM | POA: Insufficient documentation

## 2019-01-04 DIAGNOSIS — G4733 Obstructive sleep apnea (adult) (pediatric): Secondary | ICD-10-CM | POA: Insufficient documentation

## 2019-01-04 DIAGNOSIS — I110 Hypertensive heart disease with heart failure: Secondary | ICD-10-CM | POA: Diagnosis not present

## 2019-01-04 DIAGNOSIS — Z7951 Long term (current) use of inhaled steroids: Secondary | ICD-10-CM | POA: Insufficient documentation

## 2019-01-04 DIAGNOSIS — Z794 Long term (current) use of insulin: Secondary | ICD-10-CM | POA: Diagnosis not present

## 2019-01-04 DIAGNOSIS — Z7982 Long term (current) use of aspirin: Secondary | ICD-10-CM | POA: Insufficient documentation

## 2019-01-04 DIAGNOSIS — Z882 Allergy status to sulfonamides status: Secondary | ICD-10-CM | POA: Insufficient documentation

## 2019-01-04 DIAGNOSIS — I5022 Chronic systolic (congestive) heart failure: Secondary | ICD-10-CM | POA: Diagnosis present

## 2019-01-04 DIAGNOSIS — Z720 Tobacco use: Secondary | ICD-10-CM

## 2019-01-04 LAB — BASIC METABOLIC PANEL
Anion gap: 9 (ref 5–15)
BUN: 17 mg/dL (ref 8–23)
CO2: 24 mmol/L (ref 22–32)
Calcium: 9 mg/dL (ref 8.9–10.3)
Chloride: 101 mmol/L (ref 98–111)
Creatinine, Ser: 1.04 mg/dL (ref 0.61–1.24)
GFR calc Af Amer: >60 mL/min (ref >60)
GFR calc non Af Amer: >60 mL/min (ref >60)
Glucose, Bld: 246 mg/dL — ABNORMAL HIGH (ref 70–99)
Potassium: 3.9 mmol/L (ref 3.5–5.1)
Sodium: 134 mmol/L — ABNORMAL LOW (ref 135–145)

## 2019-01-04 NOTE — Patient Instructions (Addendum)
Continue weighing daily and call for an overnight weight gain of >2 pounds or a weekly weight gain of >5 pounds.  Increase entresto to 49-51mg  twice a day. You may take 2 pills of your current bottles twice a day. Call the office when you have 1 month left and we will send you a new prescription. Once you receive the new prescription, take 1 pill twice a day again.

## 2019-01-05 ENCOUNTER — Encounter: Payer: Self-pay | Admitting: Family

## 2019-01-29 ENCOUNTER — Ambulatory Visit: Payer: Medicare HMO | Admitting: Family

## 2019-02-02 NOTE — Progress Notes (Signed)
Patient ID: Alexander Cobb, male    DOB: 1950/12/28, 68 y.o.   MRN: [id number]  HPI  Alexander Cobb is a 68 y/o male with a history of CAD, DM, hyperlipidemia, HTN, obstructive sleep apnea, current tobacco use and chronic heart failure.   Echo report from 05/18/2018 reviewed and showed an EF of 35-40% along with trivial Alexander.  Catheterization done 08/07/10 which shows EF of 33% along with 75% in right PDA.   He has not been admitted or been in the ED in the last 6 months.   He presents today for a follow-up visit with a chief complaint of minimal shortness of breath upon moderate exertion. He describes this as chronic in nature having been present for several years. He has associated cough, rhinorrhea and light-headedness along with this. He denies any difficulty sleeping, abdominal distention, palpitations, pedal edema, chest pain, fatigue or weight gain.   Ate a slice of pecan pie last night and his glucose in clinic today was 281. He also forgot to take his insulin this morning but will take it once he returns home.   Past Medical History:  Diagnosis Date  . CAD (coronary artery disease) 12/21/2010  . Cardiomyopathy   . CHF (congestive heart failure) ([place])   . Diabetes mellitus without complication ([place])   . Fluttering heart 12/21/2010  . HTN (hypertension) 12/21/2010  . Hyperlipidemia   . OSA (obstructive sleep apnea)    Uses CPAP at home   Past Surgical History:  Procedure Laterality Date  . CARDIAC CATHETERIZATION    . COLONOSCOPY WITH PROPOFOL N/A 11/07/2015   Procedure: COLONOSCOPY WITH PROPOFOL;  Surgeon: Lollie Sails, MD;  Location: [facility] ENDOSCOPY;  Service: Endoscopy;  Laterality: N/A;  . TEE WITHOUT CARDIOVERSION N/A 05/18/2018   Procedure: TRANSESOPHAGEAL ECHOCARDIOGRAM (TEE);  Surgeon: Wellington Hampshire, MD;  Location: [facility] ORS;  Service: Cardiovascular;  Laterality: N/A;   Family History  Problem Relation Age of Onset  . COPD Father    Social History   Tobacco Use  .  Smoking status: Current Every Day Smoker    Packs/day: 0.75    Years: 40.00    Pack years: 30.00    Types: Cigarettes  . Smokeless tobacco: Never Used  Substance Use Topics  . Alcohol use: Not Currently   Allergies  Allergen Reactions  . Sulfa Drugs Cross Reactors Hives    Hives    Prior to Admission medications   Medication Sig Start Date End Date Taking? Authorizing Provider  aspirin 81 MG tablet Take 81 mg by mouth daily. Reported on 06/30/2015   Yes [provider]  blood glucose meter kit and supplies KIT Dispense based on patient and insurance preference. Use up to four times daily as directed. (FOR ICD-9 250.00, 250.01). 05/20/18  Yes Gouru, Aruna, MD  ibuprofen (ADVIL,MOTRIN) 200 MG tablet Take 200 mg by mouth every 6 (six) hours as needed for mild pain or moderate pain.   Yes [provider]  Insulin Glargine (LANTUS SOLOSTAR) 100 UNIT/ML Solostar Pen Inject 15 Units into the skin daily. 05/20/18  Yes Gouru, Illene Silver, MD  Insulin Pen Needle 32G X 4 MM MISC 15 Units by Does not apply route daily at 10 pm. 05/20/18  Yes Gouru, Illene Silver, MD  metFORMIN (GLUCOPHAGE) 500 MG tablet Take 1 tablet (500 mg total) by mouth 2 (two) times daily with a meal. 05/20/18  Yes Gouru, Aruna, MD  metoprolol succinate (TOPROL XL) 25 MG 24 hr tablet Take 1 tablet (25  mg total) by mouth daily. 05/28/18  Yes [name] A, FNP  sacubitril-valsartan (ENTRESTO) 24-26 MG Take 1 tablet by mouth 2 (two) times daily. Patient taking differently: Take 2 tablets by mouth 2 (two) times daily.  12/07/18  Yes Alisa Graff, FNP     Review of Systems  Constitutional: Negative for appetite change and fatigue.  HENT: Positive for rhinorrhea. Negative for congestion and sore throat.   Eyes: Negative.   Respiratory: Positive for cough (productive) and shortness of breath (with moderate exertion).   Cardiovascular: Negative for chest pain, palpitations and leg swelling.  Gastrointestinal: Negative for  abdominal distention and abdominal pain.  Endocrine: Negative.   Genitourinary: Negative.   Musculoskeletal: Negative for back pain and neck pain.  Skin: Negative.   Allergic/Immunologic: Negative.   Neurological: Positive for light-headedness. Negative for dizziness.  Hematological: Negative for adenopathy. Does not bruise/bleed easily.  Psychiatric/Behavioral: Negative for dysphoric mood and sleep disturbance (CPAP with 1 pillow under head). The patient is not nervous/anxious.    Vitals:   02/03/19 1146  BP: (!) 158/81  Pulse: 83  Resp: 20  SpO2: 100%  Weight: 181 lb 3.2 oz (82.2 kg)  Height: _0  (1.753 m)   Wt Readings from Last 3 Encounters:  02/03/19 181 lb 3.2 oz (82.2 kg)  01/04/19 186 lb 12.8 oz (84.7 kg)  12/07/18 188 lb 6 oz (85.4 kg)   Lab Results  Component Value Date   CREATININE 1.04 01/04/2019   CREATININE 1.07 05/18/2018   CREATININE 1.31 (H) 05/17/2018     Physical Exam Vitals signs and nursing note reviewed.  Constitutional:      Appearance: Normal appearance.  HENT:     Head: Normocephalic and atraumatic.  Neck:     Musculoskeletal: Normal range of motion and neck supple.  Cardiovascular:     Rate and Rhythm: Normal rate and regular rhythm.  Pulmonary:     Effort: Pulmonary effort is normal. No respiratory distress.     Breath sounds: No wheezing or rales.  Abdominal:     General: There is no distension.     Palpations: Abdomen is soft.  Musculoskeletal:        General: No tenderness.     Right lower leg: No edema.     Left lower leg: No edema.  Skin:    General: Skin is dry.  Neurological:     General: No focal deficit present.     Mental Status: He is alert and oriented to person, place, and time.  Psychiatric:        Mood and Affect: Mood normal.        Behavior: Behavior normal.        Thought Content: Thought content normal.    Assessment & Plan:  1: Chronic heart failure with reduced ejection fraction- - NYHA class II -  euvolemic today - weighing daily; reminded to call for an overnight weight gain of >2 pounds or a weekly weight gain of >5 pounds - weight down 5 pounds from last visit 1 month ago - not adding salt and tries to eat low sodium foods; reviewed the importance of closely following a low sodium diet  - will get BMP today as entresto was increased to 49/48m BID at his last visit (he's taking 2 of his 24/225mtabs BID) - will increase his entresto to 97/10361mwice daily; he gets entresto through [employer] so he will continue the above dose and then once he receives the higher dose  through the mail from [employer], he can begin the 97/17m dose - will recheck BMP at his next visit - saw cardiology ([name] 12/28/15 & returns later today - work on titrating up metoprolol at future visits as well and consider adding farxiga in the future  2: HTN- - BP mildly elevated today; increasing entresto per above - saw PCP ([name] 08/25/2018 - BMP from 01/04/2019 reviewed and showed sodium 134, potassium 3.9, creatinine 1.04 and GFR >60  3: DM- - A1c 08/18/2018 was 7.2% - glucose in clinic today was 281 but he hasn't taken his insulin yet today and ate a slice of pecan pie last night - saw endocrinology (Honor Junes 09/09/2018  4: Tobacco use- - smoking 3/4 ppd  - complete cessation discussed for 3 minutes with him   Medication list was reviewed with the patient.  Return in 6 weeks or sooner for any questions/problems before then.

## 2019-02-02 NOTE — Progress Notes (Signed)
No old Cardiology Office Note  Date:  02/03/2019   ID:  Alexander Cobb, DOB 1950/06/26, MRN [id number]  PCP:  [name], MD   Chief Complaint  Patient presents with  . Other    Patient was last seen in 2017. Patient Patient denies chest pain and SOB at this time. Meds reviewed verbally with patient.     HPI:  Alexander Cobb is a very pleasant 68 year old gentleman with a history of smoking, who continues to smoke,  poorly controlled diabetes,  nonischemic cardiopathy, Non-STEMI, ejection fraction by catheterization estimated at 33% in May of 2012, mild proximal RCA disease, 75% PDA disease, who presents for routine followup of his coronary artery disease  Discussed his recent hospitalization 05/2018 in the hospital , acute respiratory distress, Saturations in the 70s, While in the hospital became apneic CPR was initiated and the patient was intubated.  Pneumonia seen on CT scan.  Diagnosed with Staph aureus bacteremia. abscess right buttock area.  Treated with IV Ancef. TEE negative foe endocarditis.    Reports he has recovered well since that time  He works with heavy machinery.  Previous event monitor showing SVT  On entresto, thinks it is making him feel better Rare PVC Otherwise denies significant shortness of breath Continues to smoke, pack of cigarettes in his pocket Entresto dose increased today by Darylene Price  Echo 05/2018 moderate-severely reduced systolic function, with an ejection fraction of 30-35%.  TEE 35-40% 05/2018  Compliant with his EKG personally reviewed by myself on todays visit  shows normal sinus rhythm with rate 76 bpm, ST and T wave abnormality V4 through V6 1 and aVL consistent with anterolateral ischemia, seen previously on EKGs  Other past medical history reviewed Previous event monitor from November 2016 reviewed with him showing one run of SVT otherwise normal sinus rhythm  chest pain in August 2016. Had discomfort down his  left arm, into his fingers. Reported that his flank underneath his arm in his armpit was hurting.  It was reproducible or hurt with palpation. He took nitroglycerin 3 with improvement of his symptoms. He have recurrent symptoms the next day and took nitroglycerin 1 Since then has had no further symptoms   September 2016 he has had increasing lightheadedness associated with chest fluttering/arrhythmia.   The symptoms make him feel weak,. Symptoms can go up to 5-10 minutes, on average 2-3 minutes at a time.   Previous lab work showing diabetes poorly controlled. He reports that he no longer has followup with Dr. Dr. Gabriel Cobb of endocrine. hemoglobin A1c previously more than 12. He continues to smoke one pack per day. Previous problems with sleep  Echocardiogram 2012 showed ejection fraction 40-45%. Discharge summary from May of 2012 mentions he had episodes of SVT though there is no significant detail.   PMH:   has a past medical history of CAD (coronary artery disease) (12/21/2010), Cardiomyopathy, CHF (congestive heart failure) ([place]), Diabetes mellitus without complication ([place]), Fluttering heart (12/21/2010), HTN (hypertension) (12/21/2010), Hyperlipidemia, and OSA (obstructive sleep apnea).  PSH:    Past Surgical History:  Procedure Laterality Date  . CARDIAC CATHETERIZATION    . COLONOSCOPY WITH PROPOFOL N/A 11/07/2015   Procedure: COLONOSCOPY WITH PROPOFOL;  Surgeon: Lollie Sails, MD;  Location: [facility] ENDOSCOPY;  Service: Endoscopy;  Laterality: N/A;  . TEE WITHOUT CARDIOVERSION N/A 05/18/2018   Procedure: TRANSESOPHAGEAL ECHOCARDIOGRAM (TEE);  Surgeon: Wellington Hampshire, MD;  Location: [facility] ORS;  Service: Cardiovascular;  Laterality: N/A;    Current Outpatient Medications  Medication Sig Dispense Refill  . aspirin 81 MG tablet Take 81 mg by mouth daily. Reported on 06/30/2015    . blood glucose meter kit and supplies KIT Dispense based on patient and insurance preference.  Use up to four times daily as directed. (FOR ICD-9 250.00, 250.01). 1 each 0  . ibuprofen (ADVIL,MOTRIN) 200 MG tablet Take 200 mg by mouth every 6 (six) hours as needed for mild pain or moderate pain.    . Insulin Glargine (LANTUS SOLOSTAR) 100 UNIT/ML Solostar Pen Inject 15 Units into the skin daily. 100 mL 1  . Insulin Pen Needle 32G X 4 MM MISC 15 Units by Does not apply route daily at 10 pm. 90 each 0  . metFORMIN (GLUCOPHAGE) 500 MG tablet Take 1 tablet (500 mg total) by mouth 2 (two) times daily with a meal. 60 tablet 1  . metoprolol succinate (TOPROL XL) 25 MG 24 hr tablet Take 1 tablet (25 mg total) by mouth daily. 30 tablet 5  . sacubitril-valsartan (ENTRESTO) 97-103 MG Take 1 tablet by mouth 2 (two) times daily. 180 tablet 3   No current facility-administered medications for this visit.      Allergies:   Sulfa drugs cross reactors   Social History:  The patient  reports that he has been smoking cigarettes. He has a 30.00 pack-year smoking history. He has never used smokeless tobacco. He reports previous alcohol use. He reports that he does not use drugs.   Family History:   family history includes COPD in his father.    Review of Systems: Review of Systems  Constitutional: Negative.   HENT: Positive for congestion.   Respiratory: Positive for cough.   Cardiovascular: Negative.   Gastrointestinal: Negative.   Musculoskeletal: Negative.   Neurological: Negative.   Psychiatric/Behavioral: Negative.   All other systems reviewed and are negative.    PHYSICAL EXAM: VS:  BP 140/90 (BP Location: Left Arm, Patient Position: Sitting, Cuff Size: Normal)   Pulse 76   Ht '5\' 9"'  (1.753 m)   Wt 182 lb 8 oz (82.8 kg)   SpO2 97%   BMI 26.95 kg/m  , BMI Body mass index is 26.95 kg/m. Constitutional:  oriented to person, place, and time. No distress.  HENT:  Head: Grossly normal Eyes:  no discharge. No scleral icterus.  Neck: No JVD, no carotid bruits  Cardiovascular: Regular  rate and rhythm, no murmurs appreciated Pulmonary/Chest: Clear to auscultation bilaterally, no wheezes or rails Abdominal: Soft.  no distension.  no tenderness.  Musculoskeletal: Normal range of motion Neurological:  normal muscle tone. Coordination normal. No atrophy Skin: Skin warm and dry Psychiatric: normal affect, pleasant   Recent Labs: 05/16/2018: ALT 16; Magnesium 2.0; TSH 0.012 05/20/2018: Hemoglobin 15.4; Platelets 215 02/03/2019: BUN 19; Creatinine, Ser 1.29; Potassium 4.1; Sodium 134    Lipid Panel Lab Results  Component Value Date   CHOL 177 06/10/2014   HDL 42 06/10/2014   LDLCALC 118 (H) 06/10/2014   TRIG 76 05/16/2018      Wt Readings from Last 3 Encounters:  02/03/19 182 lb 8 oz (82.8 kg)  02/03/19 181 lb 3.2 oz (82.2 kg)  01/04/19 186 lb 12.8 oz (84.7 kg)      ASSESSMENT AND PLAN:  Coronary artery disease involving native coronary artery of native heart without angina pectoris - Plan: EKG 12-Lead Currently with no symptoms of angina. No further workup at this time. Continue current medication regimen. Stable  Congestive dilated cardiomyopathy ([place]) - Plan: EKG 12-Lead  On a good medication regiment, reports that Delene Loll was increased today by CHF clinic Recommend echo in 3 months time  Essential hypertension - Plan: EKG 12-Lead Recent medication changes as above made by CHF clinic  Pure hypercholesterolemia Previously on pravastatin, it appears this has been held We will discuss with him in follow-up  Smoking We have encouraged him to continue to work on weaning his cigarettes and smoking cessation. He will continue to work on this and does not want any assistance with chantix.   Poorly controlled type 2 diabetes mellitus ([place]) A1c poorly controlled March 2020, 11.9 Down to 7.2 in June 2020 Diet discussed with him   Total encounter time more than 25 minutes  Greater than 50% was spent in counseling and coordination of care with the  patient   Disposition:   F/U  6 months   No orders of the defined types were placed in this encounter.    Signed, Esmond Plants, M.D., Ph.D. 02/03/2019  [place], [place]

## 2019-02-03 ENCOUNTER — Other Ambulatory Visit: Payer: Self-pay

## 2019-02-03 ENCOUNTER — Encounter: Payer: Self-pay | Admitting: Cardiovascular Disease

## 2019-02-03 ENCOUNTER — Ambulatory Visit: Payer: Medicare HMO | Attending: Family | Admitting: Family

## 2019-02-03 ENCOUNTER — Ambulatory Visit: Payer: Medicare HMO | Admitting: Cardiovascular Disease

## 2019-02-03 ENCOUNTER — Ambulatory Visit (INDEPENDENT_AMBULATORY_CARE_PROVIDER_SITE_OTHER): Payer: Medicare HMO | Admitting: Cardiovascular Disease

## 2019-02-03 ENCOUNTER — Encounter: Payer: Self-pay | Admitting: Family

## 2019-02-03 VITALS — BP 140/90 | HR 76 | Ht 69.0 in | Wt 182.5 lb

## 2019-02-03 VITALS — BP 158/81 | HR 83 | Resp 20 | Ht 69.0 in | Wt 181.2 lb

## 2019-02-03 DIAGNOSIS — I110 Hypertensive heart disease with heart failure: Secondary | ICD-10-CM | POA: Diagnosis not present

## 2019-02-03 DIAGNOSIS — I2510 Atherosclerotic heart disease of native coronary artery without angina pectoris: Secondary | ICD-10-CM | POA: Insufficient documentation

## 2019-02-03 DIAGNOSIS — E119 Type 2 diabetes mellitus without complications: Secondary | ICD-10-CM | POA: Diagnosis not present

## 2019-02-03 DIAGNOSIS — Z720 Tobacco use: Secondary | ICD-10-CM

## 2019-02-03 DIAGNOSIS — Z7982 Long term (current) use of aspirin: Secondary | ICD-10-CM | POA: Diagnosis not present

## 2019-02-03 DIAGNOSIS — Z882 Allergy status to sulfonamides status: Secondary | ICD-10-CM | POA: Insufficient documentation

## 2019-02-03 DIAGNOSIS — I5022 Chronic systolic (congestive) heart failure: Secondary | ICD-10-CM

## 2019-02-03 DIAGNOSIS — Z794 Long term (current) use of insulin: Secondary | ICD-10-CM | POA: Diagnosis not present

## 2019-02-03 DIAGNOSIS — F17210 Nicotine dependence, cigarettes, uncomplicated: Secondary | ICD-10-CM | POA: Diagnosis not present

## 2019-02-03 DIAGNOSIS — I509 Heart failure, unspecified: Secondary | ICD-10-CM | POA: Diagnosis not present

## 2019-02-03 DIAGNOSIS — G4733 Obstructive sleep apnea (adult) (pediatric): Secondary | ICD-10-CM | POA: Diagnosis not present

## 2019-02-03 DIAGNOSIS — I10 Essential (primary) hypertension: Secondary | ICD-10-CM

## 2019-02-03 DIAGNOSIS — I420 Dilated cardiomyopathy: Secondary | ICD-10-CM

## 2019-02-03 DIAGNOSIS — E1165 Type 2 diabetes mellitus with hyperglycemia: Secondary | ICD-10-CM

## 2019-02-03 DIAGNOSIS — Z79899 Other long term (current) drug therapy: Secondary | ICD-10-CM | POA: Diagnosis not present

## 2019-02-03 LAB — BASIC METABOLIC PANEL
Anion gap: 9 (ref 5–15)
BUN: 19 mg/dL (ref 8–23)
CO2: 24 mmol/L (ref 22–32)
Calcium: 8.7 mg/dL — ABNORMAL LOW (ref 8.9–10.3)
Chloride: 101 mmol/L (ref 98–111)
Creatinine, Ser: 1.29 mg/dL — ABNORMAL HIGH (ref 0.61–1.24)
GFR calc Af Amer: >60 mL/min (ref >60)
GFR calc non Af Amer: 57 mL/min — ABNORMAL LOW (ref >60)
Glucose, Bld: 303 mg/dL — ABNORMAL HIGH (ref 70–99)
Potassium: 4.1 mmol/L (ref 3.5–5.1)
Sodium: 134 mmol/L — ABNORMAL LOW (ref 135–145)

## 2019-02-03 LAB — GLUCOSE, CAPILLARY: Glucose-Capillary: 281 mg/dL — ABNORMAL HIGH (ref 70–99)

## 2019-02-03 MED ORDER — SACUBITRIL-VALSARTAN 97-103 MG PO TABS: 1.0000 | ORAL_TABLET | Freq: Two times a day (BID) | ORAL | 3 refills | Status: DC

## 2019-02-03 NOTE — Patient Instructions (Signed)
Continue weighing daily and call for an overnight weight gain of > 2 pounds or a weekly weight gain of >5 pounds. 

## 2019-02-03 NOTE — Patient Instructions (Addendum)
Medication Instructions:  No changes  If you need a refill on your cardiac medications before your next appointment, please call your pharmacy.    Lab work: No new labs needed   If you have labs (blood work) drawn today and your tests are completely normal, you will receive your results only by: Marland Kitchen MyChart Message (if you have MyChart) OR . A paper copy in the mail If you have any lab test that is abnormal or we need to change your treatment, we will call you to review the results.   Testing/Procedures: Echo in three months for cardiomyopathy   Your physician has requested that you have an echocardiogram. Echocardiography is a painless test that uses sound waves to create images of your heart. It provides your doctor with information about the size and shape of your heart and how well your heart's chambers and valves are working. This procedure takes approximately one hour. There are no restrictions for this procedure.     Follow-Up: At [facility], you and your health needs are our priority.  As part of our continuing mission to provide you with exceptional heart care, we have created designated Provider Care Teams.  These Care Teams include your primary Cardiologist (physician) and Advanced Practice Providers (APPs -  Physician Assistants and Nurse Practitioners) who all work together to provide you with the care you need, when you need it.  . You will need a follow up appointment in 6 months   . Providers on your designated Care Team:   . Murray Hodgkins, NP . Christell Faith, PA-C . Marrianne Mood, PA-C  Any Other Special Instructions Will Be Listed Below (If Applicable).  For educational health videos Log in to : [email or web address] Or : [email or web address], password : [email or web address]

## 2019-03-15 ENCOUNTER — Ambulatory Visit: Payer: Medicare HMO | Admitting: Family

## 2019-03-20 NOTE — Progress Notes (Signed)
Patient ID: Alexander Cobb, male    DOB: 09/24/1950, 69 y.o.   MRN: [id number]  HPI  Mr Steil is a 69 y/o male with a history of CAD, DM, hyperlipidemia, HTN, obstructive sleep apnea, current tobacco use and chronic heart failure.   Echo report from 05/18/2018 reviewed and showed an EF of 35-40% along with trivial MR.  Catheterization done 08/07/10 which shows EF of 33% along with 75% in right PDA.   He has not been admitted or been in the ED in the last 6 months.   He presents today for a follow-up visit with a chief complaint of minimal shortness of breath upon moderate exertion. He describes it as chronic in nature having been present for several years although does feel a little bit worse since he's been out of his entresto. Says that he's been out for ~ the last month waiting on [employer]. He's recently resumed his lisinopril/hctz. He has associated cough, palpitations and slight weight gain along with this. He denies any difficulty sleeping, abdominal distention, pedal edema, chest pain or fatigue.   Currently scheduled for an echo on 04/16/19.  Past Medical History:  Diagnosis Date  . CAD (coronary artery disease) 12/21/2010  . Cardiomyopathy   . CHF (congestive heart failure) ([place])   . Diabetes mellitus without complication ([place])   . Fluttering heart 12/21/2010  . HTN (hypertension) 12/21/2010  . Hyperlipidemia   . OSA (obstructive sleep apnea)    Uses CPAP at home   Past Surgical History:  Procedure Laterality Date  . CARDIAC CATHETERIZATION    . COLONOSCOPY WITH PROPOFOL N/A 11/07/2015   Procedure: COLONOSCOPY WITH PROPOFOL;  Surgeon: Lollie Sails, MD;  Location: [facility] ENDOSCOPY;  Service: Endoscopy;  Laterality: N/A;  . TEE WITHOUT CARDIOVERSION N/A 05/18/2018   Procedure: TRANSESOPHAGEAL ECHOCARDIOGRAM (TEE);  Surgeon: Wellington Hampshire, MD;  Location: [facility] ORS;  Service: Cardiovascular;  Laterality: N/A;   Family History  Problem Relation Age of Onset  . COPD Father     Social History   Tobacco Use  . Smoking status: Current Every Day Smoker    Packs/day: 0.75    Years: 40.00    Pack years: 30.00    Types: Cigarettes  . Smokeless tobacco: Never Used  Substance Use Topics  . Alcohol use: Not Currently   Allergies  Allergen Reactions  . Sulfa Drugs Cross Reactors Hives    Hives    Prior to Admission medications   Medication Sig Start Date End Date Taking? Authorizing Provider  aspirin 81 MG tablet Take 81 mg by mouth daily. Reported on 06/30/2015   Yes [provider]  blood glucose meter kit and supplies KIT Dispense based on patient and insurance preference. Use up to four times daily as directed. (FOR ICD-9 250.00, 250.01). 05/20/18  Yes Gouru, Aruna, MD  ibuprofen (ADVIL,MOTRIN) 200 MG tablet Take 200 mg by mouth every 6 (six) hours as needed for mild pain or moderate pain.   Yes [provider]  Insulin Glargine (LANTUS SOLOSTAR) 100 UNIT/ML Solostar Pen Inject 15 Units into the skin daily. 05/20/18  Yes Gouru, Illene Silver, MD  Insulin Pen Needle 32G X 4 MM MISC 15 Units by Does not apply route daily at 10 pm. 05/20/18  Yes Gouru, Aruna, MD  lisinopril-hydrochlorothiazide (ZESTORETIC) 20-12.5 MG tablet Take 1 tablet by mouth daily.   Yes [provider]  metFORMIN (GLUCOPHAGE) 500 MG tablet Take 1 tablet (500 mg total) by mouth 2 (two) times daily with  a meal. 05/20/18  Yes Gouru, Aruna, MD  metoprolol succinate (TOPROL XL) 25 MG 24 hr tablet Take 1 tablet (25 mg total) by mouth daily. 05/28/18  Yes Charisa Twitty A, FNP  sacubitril-valsartan (ENTRESTO) 97-103 MG Take 1 tablet by mouth 2 (two) times daily. Patient not taking: Reported on 03/22/2019 02/03/19   Alisa Graff, FNP     Review of Systems  Constitutional: Negative for appetite change and fatigue.  HENT: Positive for rhinorrhea. Negative for congestion and sore throat.   Eyes: Negative.   Respiratory: Positive for cough (productive) and shortness of breath (with  moderate exertion).   Cardiovascular: Positive for palpitations. Negative for chest pain and leg swelling.  Gastrointestinal: Negative for abdominal distention and abdominal pain.  Endocrine: Negative.   Genitourinary: Negative.   Musculoskeletal: Negative for back pain and neck pain.  Skin: Negative.   Allergic/Immunologic: Negative.   Neurological: Positive for light-headedness. Negative for dizziness.  Hematological: Negative for adenopathy. Does not bruise/bleed easily.  Psychiatric/Behavioral: Negative for dysphoric mood and sleep disturbance (CPAP with 1 pillow under head). The patient is not nervous/anxious.    Vitals:   03/22/19 1022  BP: (!) 164/79  Pulse: 78  Resp: 18  SpO2: 100%  Weight: 188 lb 3.2 oz (85.4 kg)  Height: '5\' 9"'  (1.753 m)   Wt Readings from Last 3 Encounters:  03/22/19 188 lb 3.2 oz (85.4 kg)  02/03/19 182 lb 8 oz (82.8 kg)  02/03/19 181 lb 3.2 oz (82.2 kg)   Lab Results  Component Value Date   CREATININE 1.29 (H) 02/03/2019   CREATININE 1.04 01/04/2019   CREATININE 1.07 05/18/2018     Physical Exam Vitals and nursing note reviewed.  Constitutional:      Appearance: Normal appearance.  HENT:     Head: Normocephalic and atraumatic.  Cardiovascular:     Rate and Rhythm: Normal rate and regular rhythm.  Pulmonary:     Effort: Pulmonary effort is normal. No respiratory distress.     Breath sounds: No wheezing or rales.  Abdominal:     General: There is no distension.     Palpations: Abdomen is soft.  Musculoskeletal:        General: No tenderness.     Cervical back: Normal range of motion and neck supple.     Right lower leg: No edema.     Left lower leg: No edema.  Skin:    General: Skin is dry.  Neurological:     General: No focal deficit present.     Mental Status: He is alert and oriented to person, place, and time.  Psychiatric:        Mood and Affect: Mood normal.        Behavior: Behavior normal.        Thought Content: Thought  content normal.    Assessment & Plan:  1: Chronic heart failure with reduced ejection fraction- - NYHA class II - euvolemic today - weighing daily; reminded to call for an overnight weight gain of >2 pounds or a weekly weight gain of >5 pounds - weight up 7 pounds from last visit 6 weeks ago - not adding salt and tries to eat low sodium foods; reviewed the importance of closely following a low sodium diet  - saw cardiology [name]) 02/03/2019 - patient has not heard anything from [employer] and has been out of entresto for ~ 1 month; he had resumed his lisinopril 70m/ HCTZ 12.541mdaily because he "knew that he needed  to do something" - will increase his lisinopril to 2 tablets daily until we hear back from [employer] - will check BMP at his next visit on 04/16/19  2: HTN- - BP elevated today but he's also gained 7 pounds in the last 6 weeks & has been out of entresto; increasing lisinopril/hctz per above - saw PCP [name]) 08/25/2018 - BMP from 02/03/2019 reviewed and showed sodium 134, potassium 4.1, creatinine 1.29 and GFR 57  3: DM- - A1c 08/18/2018 was 7.2% - glucose at home this morning was 271; he says that he ate at a steak house last night and ate more "bread than he should have"  - saw endocrinology Honor Junes) 09/09/2018  4: Tobacco use- - smoking 3/4 ppd  - complete cessation discussed for 3 minutes with him   Medication list was reviewed with the patient.  Return in 3 weeks or sooner for any questions/problems before then.

## 2019-03-22 ENCOUNTER — Encounter: Payer: Self-pay | Admitting: Family

## 2019-03-22 ENCOUNTER — Other Ambulatory Visit: Payer: Self-pay

## 2019-03-22 ENCOUNTER — Ambulatory Visit: Payer: Medicare HMO | Attending: Family | Admitting: Family

## 2019-03-22 VITALS — BP 164/79 | HR 78 | Resp 18 | Ht 69.0 in | Wt 188.2 lb

## 2019-03-22 DIAGNOSIS — F17210 Nicotine dependence, cigarettes, uncomplicated: Secondary | ICD-10-CM | POA: Diagnosis not present

## 2019-03-22 DIAGNOSIS — Z794 Long term (current) use of insulin: Secondary | ICD-10-CM | POA: Insufficient documentation

## 2019-03-22 DIAGNOSIS — I429 Cardiomyopathy, unspecified: Secondary | ICD-10-CM | POA: Diagnosis not present

## 2019-03-22 DIAGNOSIS — Z7901 Long term (current) use of anticoagulants: Secondary | ICD-10-CM | POA: Diagnosis not present

## 2019-03-22 DIAGNOSIS — Z7982 Long term (current) use of aspirin: Secondary | ICD-10-CM | POA: Insufficient documentation

## 2019-03-22 DIAGNOSIS — I5022 Chronic systolic (congestive) heart failure: Secondary | ICD-10-CM | POA: Diagnosis not present

## 2019-03-22 DIAGNOSIS — I110 Hypertensive heart disease with heart failure: Secondary | ICD-10-CM | POA: Insufficient documentation

## 2019-03-22 DIAGNOSIS — I10 Essential (primary) hypertension: Secondary | ICD-10-CM

## 2019-03-22 DIAGNOSIS — R002 Palpitations: Secondary | ICD-10-CM | POA: Diagnosis not present

## 2019-03-22 DIAGNOSIS — I2510 Atherosclerotic heart disease of native coronary artery without angina pectoris: Secondary | ICD-10-CM | POA: Diagnosis not present

## 2019-03-22 DIAGNOSIS — E119 Type 2 diabetes mellitus without complications: Secondary | ICD-10-CM

## 2019-03-22 DIAGNOSIS — R05 Cough: Secondary | ICD-10-CM | POA: Diagnosis not present

## 2019-03-22 DIAGNOSIS — Z720 Tobacco use: Secondary | ICD-10-CM

## 2019-03-22 DIAGNOSIS — E785 Hyperlipidemia, unspecified: Secondary | ICD-10-CM | POA: Diagnosis not present

## 2019-03-22 DIAGNOSIS — R0602 Shortness of breath: Secondary | ICD-10-CM | POA: Insufficient documentation

## 2019-03-22 DIAGNOSIS — G4733 Obstructive sleep apnea (adult) (pediatric): Secondary | ICD-10-CM | POA: Insufficient documentation

## 2019-03-22 MED ORDER — LISINOPRIL-HYDROCHLOROTHIAZIDE 20-12.5 MG PO TABS: 2.0000 | ORAL_TABLET | Freq: Every day | ORAL | 30 refills | Status: DC

## 2019-03-22 NOTE — Patient Instructions (Addendum)
Continue weighing daily and call for an overnight weight gain of > 2 pounds or a weekly weight gain of >5 pounds.  Increase lisinopril/ HCTZ to 2 tablets daily until we get your entresto renewed through [employer].

## 2019-03-26 ENCOUNTER — Telehealth: Payer: Self-pay | Admitting: Cardiovascular Disease

## 2019-03-26 NOTE — Telephone Encounter (Signed)
-----   Message from Valora Corporal, RN sent at 03/25/2019  5:27 PM EST ----- Regarding: FW: 2/5 echo Could we reschedule his echo a few months out to give his meds time to work? Thanks ----- Message ----- From: Minna Merritts, MD Sent: 03/25/2019   4:28 PM EST To: Valora Corporal, RN, Alisa Graff, FNP Subject: RE: 2/5 echo                                   Good call, Pam can we delay his echo until he is back on track Maybe a few months Thx TG ----- Message ----- From: Alisa Graff, FNP Sent: 03/22/2019  11:13 AM EST To: Valora Corporal, RN, Minna Merritts, MD Subject: 2/5 echo                                       I saw this patient in clinic today and saw that he had an echo scheduled for 2/5. Wanted to let you know about his medications in case you wanted to R/S the echo.  He has been out of entresto for ~ the last month. He had been getting it through [employer] and we reapplied but haven't heard anything from [employer]. Amber was on hold with [employer] for >30 minutes and finally hung up and will try again later.   Patient resumed his lisinopril/hctz 20mg /12.5mg  daily on his own so I increased that to 2 tablets daily today until we hear from [employer].   Otila Kluver

## 2019-04-14 NOTE — Progress Notes (Signed)
Patient ID: Alexander Cobb, male    DOB: [birth year], 69 y.o.   MRN: [id number]  HPI  Mr Ritchey is a 69 y/o male with a history of CAD, DM, hyperlipidemia, HTN, obstructive sleep apnea, current tobacco use and chronic heart failure.   Echo report from 05/18/2018 reviewed and showed an EF of 35-40% along with trivial MR.  Catheterization done 08/07/10 which shows EF of 33% along with 75% in right PDA.   He has not been admitted or been in the ED in the last 6 months.   He presents today for a follow-up visit with a chief complaint of minimal shortness of breath upon moderate exertion. He describes this as chronic in nature having been present for several years. He has associated cough, palpitations and occasional light-headedness along with this. He denies any difficulty sleeping, abdominal distention, pedal edema, chest pain, fatigue or weight gain.   He received his entresto in the mail from [employer] but did not skip 36 hours from his last dose of lisinopril/HCTZ until his first dose of entresto and says that he did have some chest discomfort a few days after that but it has since resolved. Currently has an echo scheduled on 06/15/19  Past Medical History:  Diagnosis Date  . CAD (coronary artery disease) 12/21/2010  . Cardiomyopathy   . CHF (congestive heart failure) ([place])   . Diabetes mellitus without complication ([place])   . Fluttering heart 12/21/2010  . HTN (hypertension) 12/21/2010  . Hyperlipidemia   . OSA (obstructive sleep apnea)    Uses CPAP at home   Past Surgical History:  Procedure Laterality Date  . CARDIAC CATHETERIZATION    . COLONOSCOPY WITH PROPOFOL N/A 11/07/2015   Procedure: COLONOSCOPY WITH PROPOFOL;  Surgeon: Lollie Sails, MD;  Location: [facility] ENDOSCOPY;  Service: Endoscopy;  Laterality: N/A;  . TEE WITHOUT CARDIOVERSION N/A 05/18/2018   Procedure: TRANSESOPHAGEAL ECHOCARDIOGRAM (TEE);  Surgeon: Wellington Hampshire, MD;  Location: [facility] ORS;  Service: Cardiovascular;   Laterality: N/A;   Family History  Problem Relation Age of Onset  . COPD Father    Social History   Tobacco Use  . Smoking status: Current Every Day Smoker    Packs/day: 0.75    Years: 40.00    Pack years: 30.00    Types: Cigarettes  . Smokeless tobacco: Never Used  Substance Use Topics  . Alcohol use: Not Currently   Allergies  Allergen Reactions  . Sulfa Drugs Cross Reactors Hives    Hives    Prior to Admission medications   Medication Sig Start Date End Date Taking? Authorizing Provider  aspirin 81 MG tablet Take 81 mg by mouth daily. Reported on 06/30/2015   Yes [provider]  blood glucose meter kit and supplies KIT Dispense based on patient and insurance preference. Use up to four times daily as directed. (FOR ICD-9 250.00, 250.01). 05/20/18  Yes Gouru, Aruna, MD  ibuprofen (ADVIL,MOTRIN) 200 MG tablet Take 200 mg by mouth every 6 (six) hours as needed for mild pain or moderate pain.   Yes [provider]  Insulin Glargine (LANTUS SOLOSTAR) 100 UNIT/ML Solostar Pen Inject 15 Units into the skin daily. 05/20/18  Yes Gouru, Illene Silver, MD  Insulin Pen Needle 32G X 4 MM MISC 15 Units by Does not apply route daily at 10 pm. 05/20/18  Yes Gouru, Illene Silver, MD  metFORMIN (GLUCOPHAGE) 500 MG tablet Take 1 tablet (500 mg total) by mouth 2 (two) times daily with a meal. 05/20/18  Yes Gouru, Aruna, MD  metoprolol succinate (TOPROL XL) 25 MG 24 hr tablet Take 1 tablet (25 mg total) by mouth daily. 05/28/18  Yes Bentleigh Waren A, FNP  sacubitril-valsartan (ENTRESTO) 97-103 MG Take 1 tablet by mouth 2 (two) times daily. 02/03/19  Yes Hadas Jessop, Otila Kluver A, FNP  nitroGLYCERIN (NITROSTAT) 0.4 MG SL tablet Place 1 tablet (0.4 mg total) under the tongue every 5 (five) minutes as needed for chest pain. 04/15/19 07/14/19  Alisa Graff, FNP     Review of Systems  Constitutional: Negative for appetite change and fatigue.  HENT: Positive for rhinorrhea. Negative for congestion and sore throat.    Eyes: Negative.   Respiratory: Positive for cough (productive) and shortness of breath (with moderate exertion).   Cardiovascular: Positive for palpitations. Negative for chest pain and leg swelling.  Gastrointestinal: Negative for abdominal distention and abdominal pain.  Endocrine: Negative.   Genitourinary: Negative.   Musculoskeletal: Negative for back pain and neck pain.  Skin: Negative.   Allergic/Immunologic: Negative.   Neurological: Positive for light-headedness. Negative for dizziness.  Hematological: Negative for adenopathy. Does not bruise/bleed easily.  Psychiatric/Behavioral: Negative for dysphoric mood and sleep disturbance (CPAP with 1 pillow under head). The patient is not nervous/anxious.    Vitals:   04/15/19 0935  BP: (!) 147/76  Pulse: 98  Resp: 18  SpO2: 98%  Weight: 185 lb 9.6 oz (84.2 kg)  Height: '5\' 9"'  (1.753 m)   Wt Readings from Last 3 Encounters:  04/15/19 185 lb 9.6 oz (84.2 kg)  03/22/19 188 lb 3.2 oz (85.4 kg)  02/03/19 182 lb 8 oz (82.8 kg)   Lab Results  Component Value Date   CREATININE 1.29 (H) 02/03/2019   CREATININE 1.04 01/04/2019   CREATININE 1.07 05/18/2018     Physical Exam Vitals and nursing note reviewed.  Constitutional:      Appearance: Normal appearance.  HENT:     Head: Normocephalic and atraumatic.  Cardiovascular:     Rate and Rhythm: Normal rate and regular rhythm.  Pulmonary:     Effort: Pulmonary effort is normal. No respiratory distress.     Breath sounds: No wheezing or rales.  Abdominal:     General: There is no distension.     Palpations: Abdomen is soft.  Musculoskeletal:        General: No tenderness.     Cervical back: Normal range of motion and neck supple.     Right lower leg: No edema.     Left lower leg: No edema.  Skin:    General: Skin is dry.  Neurological:     General: No focal deficit present.     Mental Status: He is alert and oriented to person, place, and time.  Psychiatric:         Mood and Affect: Mood normal.        Behavior: Behavior normal.        Thought Content: Thought content normal.    Assessment & Plan:  1: Chronic heart failure with reduced ejection fraction- - NYHA class II - euvolemic today - weighing daily; reminded to call for an overnight weight gain of >2 pounds or a weekly weight gain of >5 pounds - weight down 3 pounds from last visit 3 weeks ago - not adding salt and tries to eat low sodium foods; reviewed the importance of closely following a low sodium diet  - saw cardiology [name]) 02/03/2019 - now back on entresto 97/172m BID; didn't do 36 hour  wash out at home between lisinopril/HCTZ and entresto and had some chest discomfort but that has since resolved - echo is ordered on 06/15/19 - will check BMP today  2: HTN- - BP looks good today - saw PCP [name]) 08/25/2018 - BMP from 02/03/2019 reviewed and showed sodium 134, potassium 4.1, creatinine 1.29 and GFR 57  3: DM- - A1c 08/18/2018 was 7.2% - glucose at home this morning was 199 - saw endocrinology Honor Junes) 09/09/2018  4: Tobacco use- - smoking 3/4 ppd  - complete cessation discussed for 3 minutes with him   Medication list was reviewed with the patient.  Return in 2 months or sooner for any questions/problems before then.

## 2019-04-15 ENCOUNTER — Encounter: Payer: Self-pay | Admitting: Family

## 2019-04-15 ENCOUNTER — Other Ambulatory Visit: Payer: Self-pay

## 2019-04-15 ENCOUNTER — Ambulatory Visit: Payer: Medicare HMO | Attending: Family | Admitting: Family

## 2019-04-15 VITALS — BP 147/76 | HR 98 | Resp 18 | Ht 69.0 in | Wt 185.6 lb

## 2019-04-15 DIAGNOSIS — F17210 Nicotine dependence, cigarettes, uncomplicated: Secondary | ICD-10-CM | POA: Diagnosis not present

## 2019-04-15 DIAGNOSIS — I429 Cardiomyopathy, unspecified: Secondary | ICD-10-CM | POA: Insufficient documentation

## 2019-04-15 DIAGNOSIS — E119 Type 2 diabetes mellitus without complications: Secondary | ICD-10-CM | POA: Diagnosis not present

## 2019-04-15 DIAGNOSIS — G4733 Obstructive sleep apnea (adult) (pediatric): Secondary | ICD-10-CM | POA: Diagnosis not present

## 2019-04-15 DIAGNOSIS — Z794 Long term (current) use of insulin: Secondary | ICD-10-CM | POA: Diagnosis not present

## 2019-04-15 DIAGNOSIS — I10 Essential (primary) hypertension: Secondary | ICD-10-CM

## 2019-04-15 DIAGNOSIS — I2510 Atherosclerotic heart disease of native coronary artery without angina pectoris: Secondary | ICD-10-CM | POA: Diagnosis not present

## 2019-04-15 DIAGNOSIS — Z7982 Long term (current) use of aspirin: Secondary | ICD-10-CM | POA: Insufficient documentation

## 2019-04-15 DIAGNOSIS — Z720 Tobacco use: Secondary | ICD-10-CM

## 2019-04-15 DIAGNOSIS — I5022 Chronic systolic (congestive) heart failure: Secondary | ICD-10-CM | POA: Insufficient documentation

## 2019-04-15 DIAGNOSIS — Z79899 Other long term (current) drug therapy: Secondary | ICD-10-CM | POA: Insufficient documentation

## 2019-04-15 DIAGNOSIS — I110 Hypertensive heart disease with heart failure: Secondary | ICD-10-CM | POA: Diagnosis not present

## 2019-04-15 LAB — BASIC METABOLIC PANEL
Anion gap: 10 (ref 5–15)
BUN: 14 mg/dL (ref 8–23)
CO2: 25 mmol/L (ref 22–32)
Calcium: 9 mg/dL (ref 8.9–10.3)
Chloride: 100 mmol/L (ref 98–111)
Creatinine, Ser: 0.92 mg/dL (ref 0.61–1.24)
GFR calc Af Amer: >60 mL/min (ref >60)
GFR calc non Af Amer: >60 mL/min (ref >60)
Glucose, Bld: 201 mg/dL — ABNORMAL HIGH (ref 70–99)
Potassium: 4.1 mmol/L (ref 3.5–5.1)
Sodium: 135 mmol/L (ref 135–145)

## 2019-04-15 MED ORDER — NITROGLYCERIN 0.4 MG SL SUBL: 0.4000 mg | SUBLINGUAL_TABLET | SUBLINGUAL | 3 refills | Status: AC | PRN

## 2019-04-15 NOTE — Patient Instructions (Signed)
Continue weighing daily and call for an overnight weight gain of > 2 pounds or a weekly weight gain of >5 pounds. 

## 2019-04-16 ENCOUNTER — Other Ambulatory Visit: Payer: Medicare HMO

## 2019-04-16 ENCOUNTER — Ambulatory Visit: Payer: Medicare HMO | Admitting: Family

## 2019-06-14 NOTE — Progress Notes (Signed)
Patient ID: Alexander Cobb, male    DOB: 1950-07-05, 69 y.o.   MRN: [id number]  HPI  Alexander Cobb is a 69 y/o male with a history of CAD, DM, hyperlipidemia, HTN, obstructive sleep apnea, current tobacco use and chronic heart failure.   Echo report from 05/18/2018 reviewed and showed an EF of 35-40% along with trivial Alexander.  Catheterization done 08/07/10 which shows EF of 33% along with 75% in right PDA.   He has not been admitted or been in the ED in the last 6 months.   He presents today for a follow-up visit with a chief complaint of minimal shortness of breath upon moderate exertion. He describes this as chronic in nature having been present for several years. He has associated fatigue, cough, palpitations and rhinorrhea along with this. He denies any difficulty sleeping, dizziness, abdominal distention, pedal edema, chest pain or weight gain.  Having echo done later today  Past Medical History:  Diagnosis Date  . CAD (coronary artery disease) 12/21/2010  . Cardiomyopathy   . CHF (congestive heart failure) ([place])   . Diabetes mellitus without complication ([place])   . Fluttering heart 12/21/2010  . HTN (hypertension) 12/21/2010  . Hyperlipidemia   . OSA (obstructive sleep apnea)    Uses CPAP at home   Past Surgical History:  Procedure Laterality Date  . CARDIAC CATHETERIZATION    . COLONOSCOPY WITH PROPOFOL N/A 11/07/2015   Procedure: COLONOSCOPY WITH PROPOFOL;  Surgeon: Lollie Sails, MD;  Location: [facility] ENDOSCOPY;  Service: Endoscopy;  Laterality: N/A;  . TEE WITHOUT CARDIOVERSION N/A 05/18/2018   Procedure: TRANSESOPHAGEAL ECHOCARDIOGRAM (TEE);  Surgeon: Wellington Hampshire, MD;  Location: [facility] ORS;  Service: Cardiovascular;  Laterality: N/A;   Family History  Problem Relation Age of Onset  . COPD Father    Social History   Tobacco Use  . Smoking status: Current Every Day Smoker    Packs/day: 0.75    Years: 40.00    Pack years: 30.00    Types: Cigarettes  . Smokeless  tobacco: Never Used  Substance Use Topics  . Alcohol use: Not Currently   Allergies  Allergen Reactions  . Sulfa Drugs Cross Reactors Hives    Hives    Prior to Admission medications   Medication Sig Start Date End Date Taking? Authorizing Provider  aspirin 81 MG tablet Take 81 mg by mouth daily. Reported on 06/30/2015   Yes [provider]  blood glucose meter kit and supplies KIT Dispense based on patient and insurance preference. Use up to four times daily as directed. (FOR ICD-9 250.00, 250.01). 05/20/18  Yes Gouru, Aruna, MD  ibuprofen (ADVIL,MOTRIN) 200 MG tablet Take 200 mg by mouth every 6 (six) hours as needed for mild pain or moderate pain.   Yes [provider]  Insulin Glargine (LANTUS SOLOSTAR) 100 UNIT/ML Solostar Pen Inject 15 Units into the skin daily. 05/20/18  Yes Gouru, Illene Silver, MD  Insulin Pen Needle 32G X 4 MM MISC 15 Units by Does not apply route daily at 10 pm. 05/20/18  Yes Gouru, Illene Silver, MD  metFORMIN (GLUCOPHAGE) 500 MG tablet Take 1 tablet (500 mg total) by mouth 2 (two) times daily with a meal. 05/20/18  Yes Gouru, Aruna, MD  metoprolol succinate (TOPROL XL) 25 MG 24 hr tablet Take 1 tablet (25 mg total) by mouth daily. 05/28/18  Yes Omair Dettmer, Otila Kluver A, FNP  nitroGLYCERIN (NITROSTAT) 0.4 MG SL tablet Place 1 tablet (0.4 mg total) under the tongue every 5 (  five) minutes as needed for chest pain. 04/15/19 07/14/19 Yes Alaina Donati A, FNP  sacubitril-valsartan (ENTRESTO) 97-103 MG Take 1 tablet by mouth 2 (two) times daily. 02/03/19  Yes Alisa Graff, FNP     Review of Systems  Constitutional: Positive for fatigue. Negative for appetite change.  HENT: Positive for rhinorrhea. Negative for congestion and sore throat.   Eyes: Negative.   Respiratory: Positive for cough (productive) and shortness of breath (with moderate exertion).   Cardiovascular: Positive for palpitations. Negative for chest pain and leg swelling.  Gastrointestinal: Negative for abdominal  distention and abdominal pain.  Endocrine: Negative.   Genitourinary: Negative.   Musculoskeletal: Negative for back pain and neck pain.  Skin: Negative.   Allergic/Immunologic: Negative.   Neurological: Negative for dizziness and light-headedness.  Hematological: Negative for adenopathy. Does not bruise/bleed easily.  Psychiatric/Behavioral: Negative for dysphoric mood and sleep disturbance (CPAP with 1 pillow under head). The patient is not nervous/anxious.    Vitals:   06/15/19 1348  BP: (!) 148/71  Pulse: 67  Resp: 20  SpO2: 99%  Weight: 189 lb 2 oz (85.8 kg)  Height: '5\' 9"'  (1.753 m)   Wt Readings from Last 3 Encounters:  06/15/19 189 lb 2 oz (85.8 kg)  04/15/19 185 lb 9.6 oz (84.2 kg)  03/22/19 188 lb 3.2 oz (85.4 kg)   Lab Results  Component Value Date   CREATININE 0.92 04/15/2019   CREATININE 1.29 (H) 02/03/2019   CREATININE 1.04 01/04/2019    Physical Exam Vitals and nursing note reviewed.  Constitutional:      Appearance: Normal appearance.  HENT:     Head: Normocephalic and atraumatic.  Cardiovascular:     Rate and Rhythm: Normal rate and regular rhythm.  Pulmonary:     Effort: Pulmonary effort is normal. No respiratory distress.     Breath sounds: No wheezing or rales.  Abdominal:     General: There is no distension.     Palpations: Abdomen is soft.  Musculoskeletal:        General: No tenderness.     Cervical back: Normal range of motion and neck supple.     Right lower leg: No edema.     Left lower leg: No edema.  Skin:    General: Skin is dry.  Neurological:     General: No focal deficit present.     Mental Status: He is alert and oriented to person, place, and time.  Psychiatric:        Mood and Affect: Mood normal.        Behavior: Behavior normal.        Thought Content: Thought content normal.    Assessment & Plan:  1: Chronic heart failure with reduced ejection fraction- - NYHA class II - euvolemic today - weighing daily; reminded  to call for an overnight weight gain of >2 pounds or a weekly weight gain of >5 pounds - weight up ~ 3 pounds from last visit 2 months ago - not adding salt and tries to eat low sodium foods; reviewed the importance of closely following a low sodium diet  - saw cardiology [name]) 02/03/2019 - echo to be done later today   2: HTN- - BP looks ok today - saw PCP [name]) 05/26/19 - BMP from 04/15/19 reviewed and showed sodium 135, potassium 4.1, creatinine 0.92 and GFR 57  3: DM- - A1c 08/18/2018 was 7.2% - nonfasting glucose in clinic was 205 as he just ate some nabs -  saw endocrinology Honor Junes) 09/09/2018  4: Tobacco use- - smoking 3/4 ppd  - says that he's waiting on insurance to approve chantix which he has taken in the past - complete cessation discussed for 3 minutes with him    Patient did not bring his medications nor a list. Each medication was verbally reviewed with the patient and he was encouraged to bring the bottles to every visit to confirm accuracy of list.  Return in 2 months or sooner for any questions/ problems before then.

## 2019-06-15 ENCOUNTER — Ambulatory Visit: Payer: Medicare HMO | Attending: Family | Admitting: Family

## 2019-06-15 ENCOUNTER — Other Ambulatory Visit: Payer: Self-pay

## 2019-06-15 ENCOUNTER — Encounter: Payer: Self-pay | Admitting: Family

## 2019-06-15 ENCOUNTER — Ambulatory Visit (INDEPENDENT_AMBULATORY_CARE_PROVIDER_SITE_OTHER): Payer: Medicare HMO

## 2019-06-15 VITALS — BP 148/71 | HR 67 | Resp 20 | Ht 69.0 in | Wt 189.1 lb

## 2019-06-15 DIAGNOSIS — E785 Hyperlipidemia, unspecified: Secondary | ICD-10-CM | POA: Insufficient documentation

## 2019-06-15 DIAGNOSIS — Z882 Allergy status to sulfonamides status: Secondary | ICD-10-CM | POA: Diagnosis not present

## 2019-06-15 DIAGNOSIS — I428 Other cardiomyopathies: Secondary | ICD-10-CM | POA: Diagnosis not present

## 2019-06-15 DIAGNOSIS — I420 Dilated cardiomyopathy: Secondary | ICD-10-CM

## 2019-06-15 DIAGNOSIS — I110 Hypertensive heart disease with heart failure: Secondary | ICD-10-CM | POA: Diagnosis not present

## 2019-06-15 DIAGNOSIS — I5022 Chronic systolic (congestive) heart failure: Secondary | ICD-10-CM | POA: Diagnosis not present

## 2019-06-15 DIAGNOSIS — Z7982 Long term (current) use of aspirin: Secondary | ICD-10-CM | POA: Diagnosis not present

## 2019-06-15 DIAGNOSIS — I2510 Atherosclerotic heart disease of native coronary artery without angina pectoris: Secondary | ICD-10-CM | POA: Diagnosis not present

## 2019-06-15 DIAGNOSIS — I10 Essential (primary) hypertension: Secondary | ICD-10-CM

## 2019-06-15 DIAGNOSIS — I509 Heart failure, unspecified: Secondary | ICD-10-CM | POA: Diagnosis present

## 2019-06-15 DIAGNOSIS — E119 Type 2 diabetes mellitus without complications: Secondary | ICD-10-CM | POA: Diagnosis not present

## 2019-06-15 DIAGNOSIS — F17210 Nicotine dependence, cigarettes, uncomplicated: Secondary | ICD-10-CM | POA: Insufficient documentation

## 2019-06-15 DIAGNOSIS — G4733 Obstructive sleep apnea (adult) (pediatric): Secondary | ICD-10-CM | POA: Diagnosis not present

## 2019-06-15 DIAGNOSIS — Z825 Family history of asthma and other chronic lower respiratory diseases: Secondary | ICD-10-CM | POA: Insufficient documentation

## 2019-06-15 DIAGNOSIS — Z79899 Other long term (current) drug therapy: Secondary | ICD-10-CM | POA: Diagnosis not present

## 2019-06-15 DIAGNOSIS — Z794 Long term (current) use of insulin: Secondary | ICD-10-CM | POA: Diagnosis not present

## 2019-06-15 DIAGNOSIS — Z720 Tobacco use: Secondary | ICD-10-CM

## 2019-06-15 LAB — GLUCOSE, CAPILLARY: Glucose-Capillary: 205 mg/dL — ABNORMAL HIGH (ref 70–99)

## 2019-06-15 LAB — ECHOCARDIOGRAM COMPLETE
Height: 69 in
Weight: 3026 oz

## 2019-06-15 NOTE — Patient Instructions (Signed)
Continue weighing daily and call for an overnight weight gain of > 2 pounds or a weekly weight gain of >5 pounds. 

## 2019-06-21 ENCOUNTER — Telehealth: Payer: Self-pay | Admitting: *Deleted

## 2019-06-21 NOTE — Telephone Encounter (Signed)
No answer.  Voicemail not set up. 

## 2019-06-21 NOTE — Telephone Encounter (Signed)
-----   Message from Minna Merritts, MD sent at 06/20/2019 10:49 AM EDT ----- Echocardiogram Ejection fraction improving Previously 30 to 35% 2020 Now up to 45 to 50% on current medications Nice improvement

## 2019-06-22 ENCOUNTER — Encounter: Payer: Self-pay | Admitting: *Deleted

## 2019-06-22 NOTE — Telephone Encounter (Signed)
No answer. No VM set up. Letter with results mailed to patient.

## 2019-08-02 NOTE — Progress Notes (Signed)
No old Cardiology Office Note  Date:  08/03/2019   ID:  [name], DOB June 24, 1950, MRN [id number]  PCP:  Alexander Ruths, MD   Chief Complaint  Patient presents with  . OTHER    6 month f/u no complaints today. Meds reviewed verbally with pt.    HPI:  [name] is a very pleasant 69 year old gentleman with a history of smoking, who continues to smoke, 1ppd poorly controlled diabetes,  nonischemic cardiopathy, Non-STEMI, ejection fraction by catheterization estimated at 33% in May of 2012, mild proximal RCA disease, 75% PDA disease,  Hospitalization for pneumonia, staph auris bacteremia March 2020 who presents for routine followup of his coronary artery disease  Echocardiogram results reviewed with him, previous ejection fraction 30 to 35% in 2020 Now up to 45 to 50% on current medications  Still working He works with heavy machinery.  age 80 no desire to retire No regular exercise program but stays active  Previous event monitor showing SVT, discussed Still having flutters , tachycardia 30 sec episode today, got dizzy Resolved without intervention Has approximately 1-2 x a week  Denies any significant shortness of breath, no chest pain on exertion  No medication side effects  EKG personally reviewed by myself on todays visit Shows normal sinus rhythm rate 79 bpm nonspecific ST-T wave abnormality anterolateral leads, no change from prior EKGs  Other past medical history reviewed 05/2018 in the hospital , acute respiratory distress, Saturations in the 70s, While in the hospital became apneic CPR was initiated and the patient was intubated.  Pneumonia seen on CT scan.  Diagnosed with Staph aureus bacteremia. abscess right buttock area.  Treated with IV Ancef. TEE negative foe endocarditis.    Reports he has recovered well since that time  Echo 05/2018 moderate-severely reduced systolic function, with an ejection fraction of 30-35%.  TEE 35-40%  05/2018  The symptoms make him feel weak,. Symptoms can go up to 5-10 minutes, on average 2-3 minutes at a time.   Echocardiogram 2012 showed ejection fraction 40-45%.   PMH:   has a past medical history of CAD (coronary artery disease) (12/21/2010), Cardiomyopathy, CHF (congestive heart failure) ([place]), Diabetes mellitus without complication ([place]), Fluttering heart (12/21/2010), HTN (hypertension) (12/21/2010), Hyperlipidemia, and OSA (obstructive sleep apnea).  PSH:    Past Surgical History:  Procedure Laterality Date  . CARDIAC CATHETERIZATION    . COLONOSCOPY WITH PROPOFOL N/A 11/07/2015   Procedure: COLONOSCOPY WITH PROPOFOL;  Surgeon: Lollie Sails, MD;  Location: [facility] ENDOSCOPY;  Service: Endoscopy;  Laterality: N/A;  . TEE WITHOUT CARDIOVERSION N/A 05/18/2018   Procedure: TRANSESOPHAGEAL ECHOCARDIOGRAM (TEE);  Surgeon: Wellington Hampshire, MD;  Location: [facility] ORS;  Service: Cardiovascular;  Laterality: N/A;    Current Outpatient Medications  Medication Sig Dispense Refill  . aspirin 81 MG tablet Take 81 mg by mouth daily. Reported on 06/30/2015    . blood glucose meter kit and supplies KIT Dispense based on patient and insurance preference. Use up to four times daily as directed. (FOR ICD-9 250.00, 250.01). 1 each 0  . ibuprofen (ADVIL,MOTRIN) 200 MG tablet Take 200 mg by mouth every 6 (six) hours as needed for mild pain or moderate pain.    . Insulin Glargine (LANTUS SOLOSTAR) 100 UNIT/ML Solostar Pen Inject 15 Units into the skin daily. 100 mL 1  . Insulin Pen Needle 32G X 4 MM MISC 15 Units by Does not apply route daily at 10 pm. 90 each 0  . metFORMIN (GLUCOPHAGE) 500 MG  tablet Take 1 tablet (500 mg total) by mouth 2 (two) times daily with a meal. 60 tablet 1  . metoprolol succinate (TOPROL XL) 25 MG 24 hr tablet Take 1 tablet (25 mg total) by mouth daily. 30 tablet 5  . nitroGLYCERIN (NITROSTAT) 0.4 MG SL tablet Place 1 tablet (0.4 mg total) under the tongue every 5 (five)  minutes as needed for chest pain. 90 tablet 3  . sacubitril-valsartan (ENTRESTO) 97-103 MG Take 1 tablet by mouth 2 (two) times daily. 180 tablet 3   No current facility-administered medications for this visit.     Allergies:   Sulfa drugs cross reactors   Social History:  The patient  reports that he has been smoking cigarettes. He has a 30.00 pack-year smoking history. He has never used smokeless tobacco. He reports previous alcohol use. He reports that he does not use drugs.   Family History:   family history includes COPD in his father.    Review of Systems: Review of Systems  Constitutional: Negative.   HENT: Negative.   Respiratory: Negative.   Cardiovascular: Negative.   Gastrointestinal: Negative.   Musculoskeletal: Negative.   Neurological: Negative.   Psychiatric/Behavioral: Negative.   All other systems reviewed and are negative.   PHYSICAL EXAM: VS:  BP 130/64 (BP Location: Left Arm, Patient Position: Sitting, Cuff Size: Normal)   Pulse 79   Ht '5\' 9"'  (1.753 m)   Wt 187 lb (84.8 kg)   SpO2 97%   BMI 27.62 kg/m  , BMI Body mass index is 27.62 kg/m. Constitutional:  oriented to person, place, and time. No distress.  HENT:  Head: Grossly normal Eyes:  no discharge. No scleral icterus.  Neck: No JVD, no carotid bruits  Cardiovascular: Regular rate and rhythm, no murmurs appreciated Pulmonary/Chest: Clear to auscultation bilaterally, no wheezes or rails Abdominal: Soft.  no distension.  no tenderness.  Musculoskeletal: Normal range of motion Neurological:  normal muscle tone. Coordination normal. No atrophy Skin: Skin warm and dry Psychiatric: normal affect, pleasant  Recent Labs: 04/15/2019: BUN 14; Creatinine, Ser 0.92; Potassium 4.1; Sodium 135    Lipid Panel Lab Results  Component Value Date   CHOL 177 06/10/2014   HDL 42 06/10/2014   LDLCALC 118 (H) 06/10/2014   TRIG 76 05/16/2018      Wt Readings from Last 3 Encounters:  08/03/19 187 lb (84.8  kg)  06/15/19 189 lb 2 oz (85.8 kg)  04/15/19 185 lb 9.6 oz (84.2 kg)      ASSESSMENT AND PLAN:  Coronary artery disease involving native coronary artery of native heart without angina pectoris - Plan: EKG 12-Lead Currently with no symptoms of angina. No further workup at this time. Continue current medication regimen. Stable  Congestive dilated cardiomyopathy ([place]) - Plan: EKG 12-Lead Improved ejection fraction, recent echocardiogram discussed with him We will increase metoprolol given runs of tachycardia, 50 mg daily  Essential hypertension - Plan: EKG 12-Lead Blood pressure stable, higher dose metoprolol as above  Pure hypercholesterolemia Reports he is still taking pravastatin, this was readded to his list, goal LDL less than 70  Smoking Long discussion with him concerning his need to quit smoking  Poorly controlled type 2 diabetes mellitus ([place]) A1c poorly controlled March 2020, 11.9 Has had some improvement since that time   Total encounter time more than 25 minutes  Greater than 50% was spent in counseling and coordination of care with the patient   Disposition:   F/U  6 months  Orders Placed This Encounter  Procedures  . EKG 12-Lead     Signed, Esmond Plants, M.D., Ph.D. 08/03/2019  [place], [place]

## 2019-08-03 ENCOUNTER — Other Ambulatory Visit: Payer: Self-pay

## 2019-08-03 ENCOUNTER — Encounter: Payer: Self-pay | Admitting: Cardiovascular Disease

## 2019-08-03 ENCOUNTER — Ambulatory Visit (INDEPENDENT_AMBULATORY_CARE_PROVIDER_SITE_OTHER): Payer: Medicare HMO | Admitting: Cardiovascular Disease

## 2019-08-03 VITALS — BP 130/64 | HR 79 | Ht 69.0 in | Wt 187.0 lb

## 2019-08-03 DIAGNOSIS — E1165 Type 2 diabetes mellitus with hyperglycemia: Secondary | ICD-10-CM

## 2019-08-03 DIAGNOSIS — E119 Type 2 diabetes mellitus without complications: Secondary | ICD-10-CM

## 2019-08-03 DIAGNOSIS — I420 Dilated cardiomyopathy: Secondary | ICD-10-CM

## 2019-08-03 DIAGNOSIS — I10 Essential (primary) hypertension: Secondary | ICD-10-CM | POA: Diagnosis not present

## 2019-08-03 DIAGNOSIS — Z720 Tobacco use: Secondary | ICD-10-CM | POA: Diagnosis not present

## 2019-08-03 DIAGNOSIS — Z794 Long term (current) use of insulin: Secondary | ICD-10-CM

## 2019-08-03 DIAGNOSIS — I5022 Chronic systolic (congestive) heart failure: Secondary | ICD-10-CM | POA: Diagnosis not present

## 2019-08-03 MED ORDER — METOPROLOL SUCCINATE ER 50 MG PO TB24: 25.0000 mg | ORAL_TABLET | Freq: Every day | ORAL | 3 refills | Status: DC

## 2019-08-03 NOTE — Patient Instructions (Addendum)
Medication Instructions:  Please increase the metoprolol up to 50 mg daily  If you need a refill on your cardiac medications before your next appointment, please call your pharmacy.    Lab work: No new labs needed   If you have labs (blood work) drawn today and your tests are completely normal, you will receive your results only by: Marland Kitchen MyChart Message (if you have MyChart) OR . A paper copy in the mail If you have any lab test that is abnormal or we need to change your treatment, we will call you to review the results.   Testing/Procedures: No new testing needed   Follow-Up: At [facility], you and your health needs are our priority.  As part of our continuing mission to provide you with exceptional heart care, we have created designated Provider Care Teams.  These Care Teams include your primary Cardiologist (physician) and Advanced Practice Providers (APPs -  Physician Assistants and Nurse Practitioners) who all work together to provide you with the care you need, when you need it.  . You will need a follow up appointment in 6 months .  Marland Kitchen Providers on your designated Care Team:   . Murray Hodgkins, NP . Christell Faith, PA-C . Marrianne Mood, PA-C  Any Other Special Instructions Will Be Listed Below (If Applicable).  COVID-19 Vaccine Information can be found at: [email or web address].uk For questions related to vaccine distribution or appointments, please email [email or web address] or call [phone].

## 2019-08-10 ENCOUNTER — Ambulatory Visit: Payer: Medicare HMO | Admitting: Cardiovascular Disease

## 2019-08-14 NOTE — Progress Notes (Signed)
Patient ID: Alexander Cobb, male    DOB: December 13, 1950, 69 y.o.   MRN: [id number]  HPI  Alexander Cobb is a 69 y/o male with a history of CAD, DM, hyperlipidemia, HTN, obstructive sleep apnea, current tobacco use and chronic heart failure.   Echo report from 06/15/19 reviewed and showed an EF of 45-50% along with moderate LVH and trivial Alexander. Echo report from 05/18/2018 reviewed and showed an EF of 35-40% along with trivial Alexander.  Catheterization done 08/07/10 which shows EF of 33% along with 75% in right PDA.   He has not been admitted or been in the ED in the last 6 months.   He presents today for a follow-up visit with a chief complaint of minimal shortness of breath upon moderate exertion. He describes this as chronic in nature having been present for several years. He has associated fatigue, cough and palpitations along with this. He denies any difficulty sleeping, abdominal distention, pedal edema, chest pain, dizziness or weight gain.   He says that he's been out of entresto for "at least 2 weeks" and says that he normally receives it in the mail.    Past Medical History:  Diagnosis Date  . CAD (coronary artery disease) 12/21/2010  . Cardiomyopathy   . CHF (congestive heart failure) ([place])   . Diabetes mellitus without complication ([place])   . Fluttering heart 12/21/2010  . HTN (hypertension) 12/21/2010  . Hyperlipidemia   . OSA (obstructive sleep apnea)    Uses CPAP at home   Past Surgical History:  Procedure Laterality Date  . CARDIAC CATHETERIZATION    . COLONOSCOPY WITH PROPOFOL N/A 11/07/2015   Procedure: COLONOSCOPY WITH PROPOFOL;  Surgeon: Lollie Sails, MD;  Location: [facility] ENDOSCOPY;  Service: Endoscopy;  Laterality: N/A;  . TEE WITHOUT CARDIOVERSION N/A 05/18/2018   Procedure: TRANSESOPHAGEAL ECHOCARDIOGRAM (TEE);  Surgeon: Wellington Hampshire, MD;  Location: [facility] ORS;  Service: Cardiovascular;  Laterality: N/A;   Family History  Problem Relation Age of Onset  . COPD Father     Social History   Tobacco Use  . Smoking status: Current Every Day Smoker    Packs/day: 0.75    Years: 40.00    Pack years: 30.00    Types: Cigarettes  . Smokeless tobacco: Never Used  Substance Use Topics  . Alcohol use: Not Currently   Allergies  Allergen Reactions  . Sulfa Drugs Cross Reactors Hives    Hives    Prior to Admission medications   Medication Sig Start Date End Date Taking? Authorizing Provider  aspirin 81 MG tablet Take 81 mg by mouth daily. Reported on 06/30/2015   Yes [provider]  blood glucose meter kit and supplies KIT Dispense based on patient and insurance preference. Use up to four times daily as directed. (FOR ICD-9 250.00, 250.01). 05/20/18  Yes Gouru, Aruna, MD  ibuprofen (ADVIL,MOTRIN) 200 MG tablet Take 200 mg by mouth every 6 (six) hours as needed for mild pain or moderate pain.   Yes [provider]  Insulin Glargine (LANTUS SOLOSTAR) 100 UNIT/ML Solostar Pen Inject 15 Units into the skin daily. 05/20/18  Yes Gouru, Illene Silver, MD  Insulin Pen Needle 32G X 4 MM MISC 15 Units by Does not apply route daily at 10 pm. 05/20/18  Yes Gouru, Illene Silver, MD  metFORMIN (GLUCOPHAGE) 500 MG tablet Take 1 tablet (500 mg total) by mouth 2 (two) times daily with a meal. 05/20/18  Yes Gouru, Aruna, MD  metoprolol succinate (TOPROL-XL) 50 MG  24 hr tablet Take 50 mg by mouth daily. Take with or immediately following a meal.   Yes [provider]  Multiple Vitamin (MULTIVITAMIN) tablet Take 1 tablet by mouth daily.   Yes [provider]  nitroGLYCERIN (NITROSTAT) 0.4 MG SL tablet Place 1 tablet (0.4 mg total) under the tongue every 5 (five) minutes as needed for chest pain. 04/15/19 08/16/19 Yes Alisa Graff, FNP  pravastatin (PRAVACHOL) 40 MG tablet Take 1 tablet (40 mg total) by mouth every evening. 08/03/19  Yes Gollan, Kathlene November, MD  sacubitril-valsartan (ENTRESTO) 97-103 MG Take 1 tablet by mouth 2 (two) times daily. Patient not taking:  Reported on 08/16/2019 02/03/19   Alisa Graff, FNP     Review of Systems  Constitutional: Positive for fatigue. Negative for appetite change.  HENT: Positive for rhinorrhea. Negative for congestion and sore throat.   Eyes: Negative.   Respiratory: Positive for cough (productive) and shortness of breath (with moderate exertion).   Cardiovascular: Positive for palpitations. Negative for chest pain and leg swelling.  Gastrointestinal: Negative for abdominal distention and abdominal pain.  Endocrine: Negative.   Genitourinary: Negative.   Musculoskeletal: Negative for back pain and neck pain.  Skin: Negative.   Allergic/Immunologic: Negative.   Neurological: Negative for dizziness and light-headedness.  Hematological: Negative for adenopathy. Does not bruise/bleed easily.  Psychiatric/Behavioral: Negative for dysphoric mood and sleep disturbance (CPAP with 1 pillow under head). The patient is not nervous/anxious.    Vitals:   08/16/19 1353  BP: (!) 141/69  Pulse: 77  Resp: 18  SpO2: 100%  Weight: 189 lb 6 oz (85.9 kg)  Height: '5\' 9"'  (1.753 m)   Wt Readings from Last 3 Encounters:  08/16/19 189 lb 6 oz (85.9 kg)  08/03/19 187 lb (84.8 kg)  06/15/19 189 lb 2 oz (85.8 kg)   Lab Results  Component Value Date   CREATININE 0.92 04/15/2019   CREATININE 1.29 (H) 02/03/2019   CREATININE 1.04 01/04/2019    Physical Exam Vitals and nursing note reviewed.  Constitutional:      Appearance: Normal appearance.  HENT:     Head: Normocephalic and atraumatic.  Cardiovascular:     Rate and Rhythm: Normal rate and regular rhythm.  Pulmonary:     Effort: Pulmonary effort is normal. No respiratory distress.     Breath sounds: No wheezing or rales.  Abdominal:     General: There is no distension.     Palpations: Abdomen is soft.  Musculoskeletal:        General: No tenderness.     Cervical back: Normal range of motion and neck supple.     Right lower leg: No edema.     Left lower  leg: No edema.  Skin:    General: Skin is dry.  Neurological:     General: No focal deficit present.     Mental Status: He is alert and oriented to person, place, and time.  Psychiatric:        Mood and Affect: Mood normal.        Behavior: Behavior normal.        Thought Content: Thought content normal.    Assessment & Plan:  1: Chronic heart failure with reduced ejection fraction- - NYHA class II - euvolemic today - weighing daily; reminded to call for an overnight weight gain of >2 pounds or a weekly weight gain of >5 pounds - weight stable from last visit 2 months ago - not adding salt and  tries to eat low sodium foods; reviewed the importance of closely following a low sodium diet  - says that he's been out of entresto for at least 2 weeks and he doesn't know how to go about getting it; NT called [employer] who advised her that patient is approved for refill but that he needs to call in and request refill - saw cardiology [name]) 08/03/19  2: HTN- - BP looks good today - saw PCP [name]) 05/26/19 - BMP from 04/15/19 reviewed and showed sodium 135, potassium 4.1, creatinine 0.92 and GFR 57  3: DM- - A1c 08/18/2018 was 7.2% - nonfasting glucose at home was 179  - saw endocrinology Honor Junes) 09/09/2018  4: Tobacco use- - smoking 3/4 ppd  - says that he's waiting on insurance to approve chantix which he has taken in the past - complete cessation discussed for 3 minutes with him    Patient did not bring his medications nor a list. Each medication was verbally reviewed with the patient and he was encouraged to bring the bottles to every visit to confirm accuracy of list.  Return in 3 months or sooner for any questions/problems before then.

## 2019-08-16 ENCOUNTER — Encounter: Payer: Self-pay | Admitting: Family

## 2019-08-16 ENCOUNTER — Other Ambulatory Visit: Payer: Self-pay

## 2019-08-16 ENCOUNTER — Ambulatory Visit: Payer: Medicare HMO | Attending: Family | Admitting: Family

## 2019-08-16 VITALS — BP 141/69 | HR 77 | Resp 18 | Ht 69.0 in | Wt 189.4 lb

## 2019-08-16 DIAGNOSIS — G4733 Obstructive sleep apnea (adult) (pediatric): Secondary | ICD-10-CM | POA: Diagnosis not present

## 2019-08-16 DIAGNOSIS — F17210 Nicotine dependence, cigarettes, uncomplicated: Secondary | ICD-10-CM | POA: Insufficient documentation

## 2019-08-16 DIAGNOSIS — I10 Essential (primary) hypertension: Secondary | ICD-10-CM

## 2019-08-16 DIAGNOSIS — Z882 Allergy status to sulfonamides status: Secondary | ICD-10-CM | POA: Insufficient documentation

## 2019-08-16 DIAGNOSIS — I2510 Atherosclerotic heart disease of native coronary artery without angina pectoris: Secondary | ICD-10-CM | POA: Diagnosis not present

## 2019-08-16 DIAGNOSIS — E119 Type 2 diabetes mellitus without complications: Secondary | ICD-10-CM | POA: Diagnosis not present

## 2019-08-16 DIAGNOSIS — Z825 Family history of asthma and other chronic lower respiratory diseases: Secondary | ICD-10-CM | POA: Insufficient documentation

## 2019-08-16 DIAGNOSIS — Z79899 Other long term (current) drug therapy: Secondary | ICD-10-CM | POA: Diagnosis not present

## 2019-08-16 DIAGNOSIS — Z794 Long term (current) use of insulin: Secondary | ICD-10-CM | POA: Insufficient documentation

## 2019-08-16 DIAGNOSIS — I509 Heart failure, unspecified: Secondary | ICD-10-CM | POA: Diagnosis present

## 2019-08-16 DIAGNOSIS — Z7982 Long term (current) use of aspirin: Secondary | ICD-10-CM | POA: Insufficient documentation

## 2019-08-16 DIAGNOSIS — I429 Cardiomyopathy, unspecified: Secondary | ICD-10-CM | POA: Diagnosis not present

## 2019-08-16 DIAGNOSIS — I110 Hypertensive heart disease with heart failure: Secondary | ICD-10-CM | POA: Insufficient documentation

## 2019-08-16 DIAGNOSIS — I5022 Chronic systolic (congestive) heart failure: Secondary | ICD-10-CM | POA: Diagnosis not present

## 2019-08-16 DIAGNOSIS — E785 Hyperlipidemia, unspecified: Secondary | ICD-10-CM | POA: Insufficient documentation

## 2019-08-16 DIAGNOSIS — Z720 Tobacco use: Secondary | ICD-10-CM

## 2019-08-16 NOTE — Patient Instructions (Addendum)
Continue weighing daily and call for an overnight weight gain of > 2 pounds or a weekly weight gain of >5 pounds.   Take 50mg  metoprolol every day.    BRING YOUR MEDICATION BOTTLES TO EVERY VISIT EVERY TIME

## 2019-11-09 ENCOUNTER — Other Ambulatory Visit: Payer: Self-pay

## 2019-11-09 ENCOUNTER — Encounter
Admission: EM | Disposition: A | Discharge status: Home / Self Care | Payer: Self-pay | Source: Home / Self Care | Attending: Internal Medicine

## 2019-11-09 ENCOUNTER — Inpatient Hospital Stay
Admission: EM | Admit: 2019-11-09 | Discharge: 2019-11-10 | DRG: 247 | Disposition: A | Discharge status: Home / Self Care | Payer: Medicare HMO | Attending: Internal Medicine | Admitting: Internal Medicine

## 2019-11-09 DIAGNOSIS — I213 ST elevation (STEMI) myocardial infarction of unspecified site: Secondary | ICD-10-CM | POA: Diagnosis present

## 2019-11-09 DIAGNOSIS — E785 Hyperlipidemia, unspecified: Secondary | ICD-10-CM | POA: Diagnosis present

## 2019-11-09 DIAGNOSIS — R072 Precordial pain: Secondary | ICD-10-CM | POA: Diagnosis not present

## 2019-11-09 DIAGNOSIS — I2111 ST elevation (STEMI) myocardial infarction involving right coronary artery: Principal | ICD-10-CM | POA: Diagnosis present

## 2019-11-09 DIAGNOSIS — Z79899 Other long term (current) drug therapy: Secondary | ICD-10-CM

## 2019-11-09 DIAGNOSIS — I110 Hypertensive heart disease with heart failure: Secondary | ICD-10-CM | POA: Diagnosis present

## 2019-11-09 DIAGNOSIS — F17210 Nicotine dependence, cigarettes, uncomplicated: Secondary | ICD-10-CM | POA: Diagnosis present

## 2019-11-09 DIAGNOSIS — I471 Supraventricular tachycardia: Secondary | ICD-10-CM | POA: Diagnosis present

## 2019-11-09 DIAGNOSIS — E1165 Type 2 diabetes mellitus with hyperglycemia: Secondary | ICD-10-CM | POA: Diagnosis present

## 2019-11-09 DIAGNOSIS — G4733 Obstructive sleep apnea (adult) (pediatric): Secondary | ICD-10-CM | POA: Diagnosis present

## 2019-11-09 DIAGNOSIS — N179 Acute kidney failure, unspecified: Secondary | ICD-10-CM | POA: Diagnosis present

## 2019-11-09 DIAGNOSIS — I5022 Chronic systolic (congestive) heart failure: Secondary | ICD-10-CM | POA: Diagnosis present

## 2019-11-09 DIAGNOSIS — I255 Ischemic cardiomyopathy: Secondary | ICD-10-CM | POA: Diagnosis present

## 2019-11-09 DIAGNOSIS — Z7982 Long term (current) use of aspirin: Secondary | ICD-10-CM

## 2019-11-09 DIAGNOSIS — D72829 Elevated white blood cell count, unspecified: Secondary | ICD-10-CM | POA: Diagnosis present

## 2019-11-09 DIAGNOSIS — Z20822 Contact with and (suspected) exposure to covid-19: Secondary | ICD-10-CM | POA: Diagnosis present

## 2019-11-09 DIAGNOSIS — I252 Old myocardial infarction: Secondary | ICD-10-CM

## 2019-11-09 DIAGNOSIS — I2510 Atherosclerotic heart disease of native coronary artery without angina pectoris: Secondary | ICD-10-CM | POA: Diagnosis present

## 2019-11-09 DIAGNOSIS — I429 Cardiomyopathy, unspecified: Secondary | ICD-10-CM | POA: Diagnosis not present

## 2019-11-09 DIAGNOSIS — Z794 Long term (current) use of insulin: Secondary | ICD-10-CM | POA: Diagnosis not present

## 2019-11-09 DIAGNOSIS — I420 Dilated cardiomyopathy: Secondary | ICD-10-CM | POA: Diagnosis present

## 2019-11-09 DIAGNOSIS — Z720 Tobacco use: Secondary | ICD-10-CM | POA: Diagnosis not present

## 2019-11-09 DIAGNOSIS — I10 Essential (primary) hypertension: Secondary | ICD-10-CM | POA: Diagnosis present

## 2019-11-09 HISTORY — PX: LEFT HEART CATH AND CORONARY ANGIOGRAPHY: CATH118249

## 2019-11-09 HISTORY — PX: CORONARY/GRAFT ACUTE MI REVASCULARIZATION: CATH118305

## 2019-11-09 LAB — BASIC METABOLIC PANEL
Anion gap: 6 (ref 5–15)
BUN: 16 mg/dL (ref 8–23)
CO2: 27 mmol/L (ref 22–32)
Calcium: 8.8 mg/dL — ABNORMAL LOW (ref 8.9–10.3)
Chloride: 102 mmol/L (ref 98–111)
Creatinine, Ser: 1.03 mg/dL (ref 0.61–1.24)
GFR calc Af Amer: >60 mL/min (ref >60)
GFR calc non Af Amer: >60 mL/min (ref >60)
Glucose, Bld: 189 mg/dL — ABNORMAL HIGH (ref 70–99)
Potassium: 4.2 mmol/L (ref 3.5–5.1)
Sodium: 135 mmol/L (ref 135–145)

## 2019-11-09 LAB — TROPONIN I (HIGH SENSITIVITY)
Troponin I (High Sensitivity): 521 ng/L (ref <18)
Troponin I (High Sensitivity): 3652 ng/L (ref <18)
Troponin I (High Sensitivity): 5105 ng/L (ref <18)

## 2019-11-09 LAB — PROTIME-INR
INR: 0.9 (ref 0.8–1.2)
Prothrombin Time: 12.1 seconds (ref 11.4–15.2)

## 2019-11-09 LAB — GLUCOSE, CAPILLARY
Glucose-Capillary: 166 mg/dL — ABNORMAL HIGH (ref 70–99)
Glucose-Capillary: 131 mg/dL — ABNORMAL HIGH (ref 70–99)
Glucose-Capillary: 245 mg/dL — ABNORMAL HIGH (ref 70–99)

## 2019-11-09 LAB — SARS CORONAVIRUS 2 BY RT PCR (HOSPITAL ORDER, PERFORMED IN ~~LOC~~ HOSPITAL LAB): SARS Coronavirus 2: NEGATIVE

## 2019-11-09 LAB — APTT: aPTT: 31 seconds (ref 24–36)

## 2019-11-09 LAB — MRSA PCR SCREENING: MRSA by PCR: NEGATIVE

## 2019-11-09 SURGERY — CORONARY/GRAFT ACUTE MI REVASCULARIZATION
Anesthesia: Moderate Sedation

## 2019-11-09 MED ORDER — MIDAZOLAM HCL 2 MG/2ML IJ SOLN
INTRAMUSCULAR | Status: DC | PRN
  Administered 2019-11-09: 1 mg via INTRAVENOUS

## 2019-11-09 MED ORDER — LABETALOL HCL 5 MG/ML IV SOLN
INTRAVENOUS | Status: DC | PRN
  Administered 2019-11-09: 20 mg via INTRAVENOUS

## 2019-11-09 MED ORDER — IOHEXOL 300 MG/ML  SOLN
INTRAMUSCULAR | Status: DC | PRN
  Administered 2019-11-09: 435 mL

## 2019-11-09 MED ORDER — FUROSEMIDE 10 MG/ML IJ SOLN
INTRAMUSCULAR | Status: DC | PRN
  Administered 2019-11-09: 40 mg via INTRAVENOUS

## 2019-11-09 MED ORDER — HEPARIN (PORCINE) IN NACL 1000-0.9 UT/500ML-% IV SOLN
INTRAVENOUS | Status: AC
  Filled 2019-11-09: qty 1000

## 2019-11-09 MED ORDER — TICAGRELOR 90 MG PO TABS
ORAL_TABLET | ORAL | Status: AC
  Filled 2019-11-09: qty 2

## 2019-11-09 MED ORDER — SODIUM CHLORIDE 0.9% FLUSH
3.0000 mL | Freq: Two times a day (BID) | INTRAVENOUS | Status: DC
  Administered 2019-11-09 – 2019-11-10 (×2): 3 mL via INTRAVENOUS

## 2019-11-09 MED ORDER — BIVALIRUDIN TRIFLUOROACETATE 250 MG IV SOLR
INTRAVENOUS | Status: AC
  Filled 2019-11-09: qty 250

## 2019-11-09 MED ORDER — FENTANYL CITRATE (PF) 100 MCG/2ML IJ SOLN
INTRAMUSCULAR | Status: DC | PRN
  Administered 2019-11-09: 25 ug via INTRAVENOUS

## 2019-11-09 MED ORDER — SODIUM CHLORIDE 0.9 % IV SOLN
0.2500 mg/kg/h | INTRAVENOUS | Status: AC
  Filled 2019-11-09: qty 250

## 2019-11-09 MED ORDER — ASPIRIN 81 MG PO CHEW
324.0000 mg | CHEWABLE_TABLET | Freq: Once | ORAL | Status: AC
  Administered 2019-11-09: 324 mg via ORAL

## 2019-11-09 MED ORDER — FENTANYL CITRATE (PF) 100 MCG/2ML IJ SOLN
INTRAMUSCULAR | Status: AC
  Filled 2019-11-09: qty 2

## 2019-11-09 MED ORDER — ACETAMINOPHEN 325 MG PO TABS
650.0000 mg | ORAL_TABLET | ORAL | Status: DC | PRN
  Administered 2019-11-09: 650 mg via ORAL
  Filled 2019-11-09: qty 2

## 2019-11-09 MED ORDER — ONDANSETRON HCL 4 MG/2ML IJ SOLN: 4.0000 mg | Freq: Four times a day (QID) | INTRAMUSCULAR | Status: DC | PRN

## 2019-11-09 MED ORDER — MIDAZOLAM HCL 2 MG/2ML IJ SOLN
INTRAMUSCULAR | Status: AC
  Filled 2019-11-09: qty 2

## 2019-11-09 MED ORDER — FUROSEMIDE 10 MG/ML IJ SOLN
INTRAMUSCULAR | Status: AC
  Filled 2019-11-09: qty 4

## 2019-11-09 MED ORDER — HEPARIN SODIUM (PORCINE) 5000 UNIT/ML IJ SOLN
4000.0000 [IU] | Freq: Once | INTRAMUSCULAR | Status: AC
  Administered 2019-11-09: 4000 [IU] via INTRAVENOUS

## 2019-11-09 MED ORDER — LABETALOL HCL 5 MG/ML IV SOLN: 10.0000 mg | INTRAVENOUS | Status: AC | PRN

## 2019-11-09 MED ORDER — SACUBITRIL-VALSARTAN 24-26 MG PO TABS
1.0000 | ORAL_TABLET | Freq: Two times a day (BID) | ORAL | Status: DC
  Administered 2019-11-09 – 2019-11-10 (×2): 1 via ORAL
  Filled 2019-11-09 (×3): qty 1

## 2019-11-09 MED ORDER — HYDRALAZINE HCL 20 MG/ML IJ SOLN: 10.0000 mg | INTRAMUSCULAR | Status: AC | PRN

## 2019-11-09 MED ORDER — SPIRONOLACTONE 25 MG PO TABS
12.5000 mg | ORAL_TABLET | Freq: Every day | ORAL | Status: DC
  Administered 2019-11-09 – 2019-11-10 (×2): 12.5 mg via ORAL
  Filled 2019-11-09 (×2): qty 0.5

## 2019-11-09 MED ORDER — ATORVASTATIN CALCIUM 20 MG PO TABS
80.0000 mg | ORAL_TABLET | Freq: Every day | ORAL | Status: DC
  Administered 2019-11-09 – 2019-11-10 (×2): 80 mg via ORAL
  Filled 2019-11-09: qty 4
  Filled 2019-11-09 (×2): qty 1

## 2019-11-09 MED ORDER — HEPARIN (PORCINE) IN NACL 1000-0.9 UT/500ML-% IV SOLN
INTRAVENOUS | Status: DC | PRN
  Administered 2019-11-09: 500 mL

## 2019-11-09 MED ORDER — NITROGLYCERIN 0.4 MG SL SUBL
SUBLINGUAL_TABLET | SUBLINGUAL | Status: AC
  Administered 2019-11-09: 0.4 mg
  Filled 2019-11-09: qty 3

## 2019-11-09 MED ORDER — TICAGRELOR 90 MG PO TABS
ORAL_TABLET | ORAL | Status: DC | PRN
  Administered 2019-11-09: 180 mg via ORAL

## 2019-11-09 MED ORDER — SODIUM CHLORIDE 0.9 % IV SOLN
INTRAVENOUS | Status: AC | PRN
  Administered 2019-11-09: 1.75 mg/kg/h via INTRAVENOUS

## 2019-11-09 MED ORDER — INSULIN ASPART 100 UNIT/ML ~~LOC~~ SOLN
0.0000 [IU] | Freq: Three times a day (TID) | SUBCUTANEOUS | Status: DC
  Administered 2019-11-10: 5 [IU] via SUBCUTANEOUS
  Administered 2019-11-10: 2 [IU] via SUBCUTANEOUS
  Filled 2019-11-09 (×3): qty 1

## 2019-11-09 MED ORDER — SODIUM CHLORIDE 0.9 % IV SOLN: 250.0000 mL | INTRAVENOUS | Status: DC | PRN

## 2019-11-09 MED ORDER — BIVALIRUDIN BOLUS VIA INFUSION - CUPID
INTRAVENOUS | Status: DC | PRN
  Administered 2019-11-09: 61.2 mg via INTRAVENOUS

## 2019-11-09 MED ORDER — TICAGRELOR 90 MG PO TABS: 90.0000 mg | ORAL_TABLET | Freq: Two times a day (BID) | ORAL | Status: DC

## 2019-11-09 MED ORDER — LABETALOL HCL 5 MG/ML IV SOLN
INTRAVENOUS | Status: AC
  Filled 2019-11-09: qty 4

## 2019-11-09 MED ORDER — SODIUM CHLORIDE 0.9% FLUSH: 3.0000 mL | INTRAVENOUS | Status: DC | PRN

## 2019-11-09 MED ORDER — TICAGRELOR 90 MG PO TABS
90.0000 mg | ORAL_TABLET | Freq: Two times a day (BID) | ORAL | Status: DC
  Administered 2019-11-10: 90 mg via ORAL
  Filled 2019-11-09 (×2): qty 1

## 2019-11-09 MED ORDER — ASPIRIN 81 MG PO CHEW
81.0000 mg | CHEWABLE_TABLET | Freq: Every day | ORAL | Status: DC
  Administered 2019-11-09 – 2019-11-10 (×2): 81 mg via ORAL
  Filled 2019-11-09 (×2): qty 1

## 2019-11-09 MED ORDER — METOPROLOL SUCCINATE ER 50 MG PO TB24
50.0000 mg | ORAL_TABLET | Freq: Every day | ORAL | Status: DC
  Administered 2019-11-10: 50 mg via ORAL
  Filled 2019-11-09: qty 1

## 2019-11-09 SURGICAL SUPPLY — 19 items
BALLN TREK RX 2.25X20 (BALLOONS) ×3
BALLOON TREK RX 2.25X20 (BALLOONS) ×1 IMPLANT
CATH GUIDE ADROIT 6FR AL.75 (CATHETERS) ×3 IMPLANT
CATH INFINITI 5FR ANG PIGTAIL (CATHETERS) ×3 IMPLANT
CATH INFINITI 5FR JL4 (CATHETERS) ×3 IMPLANT
CATH INFINITI 5FR JL5 (CATHETERS) ×3 IMPLANT
CATH VISTA GUIDE 6FR 3DRC (CATHETERS) ×3 IMPLANT
CATH VISTA GUIDE 6FR AR1 (CATHETERS) ×3 IMPLANT
CATH VISTA GUIDE 6FR JR4 (CATHETERS) ×3 IMPLANT
DEVICE CLOSURE MYNXGRIP 6/7F (Vascular Products) ×3 IMPLANT
DEVICE INFLAT 30 PLUS (MISCELLANEOUS) ×3 IMPLANT
DEVICE SAFEGUARD 24CM (GAUZE/BANDAGES/DRESSINGS) ×3 IMPLANT
KIT MANI 3VAL PERCEP (MISCELLANEOUS) ×3 IMPLANT
NEEDLE PERC 18GX7CM (NEEDLE) ×3 IMPLANT
PACK CARDIAC CATH (CUSTOM PROCEDURE TRAY) ×3 IMPLANT
SHEATH AVANTI 6FR X 11CM (SHEATH) ×3 IMPLANT
STENT RESOLUTE ONYX 2.25X30 (Permanent Stent) ×3 IMPLANT
WIRE G HI TQ BMW 190 (WIRE) ×3 IMPLANT
WIRE GUIDERIGHT .035X150 (WIRE) ×3 IMPLANT

## 2019-11-09 NOTE — Progress Notes (Signed)
Pt arrived to unit at this time. Alert and oriented X4. VSS on room air. Angiomax gtt infusing. Pt lying flat until 1800. PAD in place, level 0. Pt denies chest pain at this time. Fiance in waiting room.

## 2019-11-09 NOTE — Progress Notes (Signed)
Pt resting in bed quietly, fiance at bedside. No s/s of distress noted. Pt denies any pain at this time. VSS on room air. PAD assessed, level 0.

## 2019-11-09 NOTE — ED Triage Notes (Signed)
CP starting last night to left side of chest, diaphoresis when pain first occurred.

## 2019-11-09 NOTE — ED Notes (Signed)
2nd NTG tab given, pain remains at 8/10

## 2019-11-09 NOTE — Progress Notes (Signed)
Ch visited with Pt's fiance in the [place] waiting area as per recommendation by [place]. [place]. Pt was in the Cath lab. Pt's fiance Lelon Frohlich told Ch that the procedure was done an our before, but she had not heard back from Cath lab. Ch checked with the staff at Cath lab, and was told that they were waiting on an ICU bed. Ch let Lelon Frohlich know about update. Ann requested prayer. Ch prayed with her. Ch will follow-up.

## 2019-11-09 NOTE — ED Provider Notes (Addendum)
[facility] Emergency Department Provider Note  ____________________________________________  Time seen: Approximately 12:16 PM  I have reviewed the triage vital signs and the nursing notes.   HISTORY  Chief Complaint Chest Pain    HPI HUY MAJID is a 69 y.o. male with a history of CAD, CHF, diabetes, hypertension who comes ED complaining of central chest pain radiating to the left associated with shortness of breath and diaphoresis that started last night.  8/10 in intensity.  Feels like heaviness and pressure, continuous since last night without aggravating or alleviating factors.  He tried nitroglycerin at home without relief.  Primary cardiologist is Dr. [name]  He reports compliance with his medication regimen.      Past Medical History:  Diagnosis Date  . CAD (coronary artery disease) 12/21/2010  . Cardiomyopathy   . CHF (congestive heart failure) ([place])   . Diabetes mellitus without complication ([place])   . Fluttering heart 12/21/2010  . HTN (hypertension) 12/21/2010  . Hyperlipidemia   . OSA (obstructive sleep apnea)    Uses CPAP at home     Patient Active Problem List   Diagnosis Date Noted  . Chronic systolic (congestive) heart failure ([place]) 05/28/2018  . Bacteremia due to Staphylococcus aureus   . Poorly controlled type 2 diabetes mellitus ([place]) 12/28/2015  . History of hyperthyroidism 07/19/2014  . Congestive dilated cardiomyopathy ([place]) 12/21/2010  . Fluttering heart 12/21/2010  . CAD (coronary artery disease) 12/21/2010  . Tobacco use 12/21/2010  . Hyperlipidemia 12/21/2010  . HTN (hypertension) 12/21/2010     Past Surgical History:  Procedure Laterality Date  . CARDIAC CATHETERIZATION    . COLONOSCOPY WITH PROPOFOL N/A 11/07/2015   Procedure: COLONOSCOPY WITH PROPOFOL;  Surgeon: Lollie Sails, MD;  Location: [facility] ENDOSCOPY;  Service: Endoscopy;  Laterality: N/A;  . TEE WITHOUT CARDIOVERSION N/A 05/18/2018   Procedure:  TRANSESOPHAGEAL ECHOCARDIOGRAM (TEE);  Surgeon: Wellington Hampshire, MD;  Location: [facility] ORS;  Service: Cardiovascular;  Laterality: N/A;     Prior to Admission medications   Medication Sig Start Date End Date Taking? Authorizing Provider  aspirin 81 MG tablet Take 81 mg by mouth daily. Reported on 06/30/2015    [provider]  blood glucose meter kit and supplies KIT Dispense based on patient and insurance preference. Use up to four times daily as directed. (FOR ICD-9 250.00, 250.01). 05/20/18   Gouru, Illene Silver, MD  ibuprofen (ADVIL,MOTRIN) 200 MG tablet Take 200 mg by mouth every 6 (six) hours as needed for mild pain or moderate pain.    [provider]  Insulin Glargine (LANTUS SOLOSTAR) 100 UNIT/ML Solostar Pen Inject 15 Units into the skin daily. 05/20/18   Nicholes Mango, MD  Insulin Pen Needle 32G X 4 MM MISC 15 Units by Does not apply route daily at 10 pm. 05/20/18   Nicholes Mango, MD  metFORMIN (GLUCOPHAGE) 500 MG tablet Take 1 tablet (500 mg total) by mouth 2 (two) times daily with a meal. 05/20/18   Gouru, Aruna, MD  metoprolol succinate (TOPROL-XL) 50 MG 24 hr tablet Take 50 mg by mouth daily. Take with or immediately following a meal.    [provider]  Multiple Vitamin (MULTIVITAMIN) tablet Take 1 tablet by mouth daily.    [provider]  nitroGLYCERIN (NITROSTAT) 0.4 MG SL tablet Place 1 tablet (0.4 mg total) under the tongue every 5 (five) minutes as needed for chest pain. 04/15/19 08/16/19  Alisa Graff, FNP  pravastatin (PRAVACHOL) 40 MG tablet Take  1 tablet (40 mg total) by mouth every evening. 08/03/19   Gollan, Kathlene November, MD  sacubitril-valsartan (ENTRESTO) 97-103 MG Take 1 tablet by mouth 2 (two) times daily. Patient not taking: Reported on 08/16/2019 02/03/19   Alisa Graff, FNP     Allergies Sulfa drugs cross reactors   Family History  Problem Relation Age of Onset  . COPD Father     Social History Social History   Tobacco Use  .  Smoking status: Current Every Day Smoker    Packs/day: 0.75    Years: 40.00    Pack years: 30.00    Types: Cigarettes  . Smokeless tobacco: Never Used  Vaping Use  . Vaping Use: Never assessed  Substance Use Topics  . Alcohol use: Not Currently  . Drug use: No    Review of Systems  Constitutional:   No fever or chills.  ENT:   No sore throat. No rhinorrhea. Cardiovascular: Positive chest pain as above without syncope. Respiratory: Positive shortness of breath without cough. Gastrointestinal:   Negative for abdominal pain, vomiting and diarrhea.  Musculoskeletal:   Negative for focal pain or swelling All other systems reviewed and are negative except as documented above in ROS and HPI.  ____________________________________________   PHYSICAL EXAM:  VITAL SIGNS: ED Triage Vitals [11/09/19 1214]  Enc Vitals Group     BP (!) 186/81     Pulse Rate 68     Resp 16     Temp 97.8 F (36.6 C)     Temp Source Oral     SpO2 94 %     Weight      Height '5\' 9"'  (1.753 m)     Head Circumference      Peak Flow      Pain Score 8     Pain Loc      Pain Edu?      Excl. in [place]?     Vital signs reviewed, nursing assessments reviewed.   Constitutional:   Alert and oriented. Non-toxic appearance. Eyes:   Conjunctivae are normal. EOMI.  ENT      Head:   Normocephalic and atraumatic.      Nose:   Wearing a mask.      Mouth/Throat:   Wearing a mask.      Neck:   No meningismus. Full ROM. Hematological/Lymphatic/Immunilogical:   No cervical lymphadenopathy. Cardiovascular:   RRR. Symmetric bilateral radial and DP pulses.  No murmurs. Cap refill less than 2 seconds. Respiratory:   Normal respiratory effort without tachypnea/retractions. Breath sounds are clear and equal bilaterally. No wheezes/rales/rhonchi. Gastrointestinal:   Soft and nontender. Non distended. There is no CVA tenderness.  No rebound, rigidity, or guarding.  Musculoskeletal:   Normal range of motion in all  extremities. No joint effusions.  No lower extremity tenderness.  No edema. Neurologic:   Normal speech and language.  Motor grossly intact. No acute focal neurologic deficits are appreciated.  Skin:    Skin is warm, dry and intact. No rash noted.  No petechiae, purpura, or bullae.  ____________________________________________    LABS (pertinent positives/negatives) (all labs ordered are listed, but only abnormal results are displayed) Labs Reviewed  SARS CORONAVIRUS 2 BY RT PCR ([facility])  PROTIME-INR  APTT  BASIC METABOLIC PANEL  TROPONIN I (HIGH SENSITIVITY)   ____________________________________________   EKG  Interpreted by me Initial EKG performed at 12:01 shows NSR, rate 60s. Slight ST segment abnormality in III and  aVF, not diagnostic. Nl axis, intervals, and QRS.   2nd EKG performed at 12:09:01 shows NSR, rate 60s, nl axis/intervals. Persistent slight ST elevation in inferior leads, with Wandering baseline in inferior leads limiting diagnostic clarity  3rd EKG performed at 12:09:45 shows normal sinus rhythm rate of 69, normal axis and intervals.  Normal QRS.  There is ST elevation in 3 and aVF concerning for STEMI.  Code STEMI called at 12:15  ____________________________________________    RADIOLOGY  No results found.  ____________________________________________   PROCEDURES .Critical Care Performed by: Carrie Mew, MD Authorized by: Carrie Mew, MD   Critical care provider statement:    Critical care time (minutes):  30   Critical care time was exclusive of:  Separately billable procedures and treating other patients   Critical care was necessary to treat or prevent imminent or life-threatening deterioration of the following conditions:  Cardiac failure   Critical care was time spent personally by me on the following activities:  Development of treatment plan with patient or surrogate, discussions  with consultants, evaluation of patient's response to treatment, examination of patient, obtaining history from patient or surrogate, ordering and performing treatments and interventions, ordering and review of laboratory studies, ordering and review of radiographic studies, pulse oximetry, re-evaluation of patient's condition and review of old charts    ____________________________________________    CLINICAL IMPRESSION / ASSESSMENT AND PLAN / ED COURSE  Medications ordered in the ED: Medications  aspirin chewable tablet 324 mg (324 mg Oral Given 11/09/19 1226)  heparin injection 4,000 Units (4,000 Units Intravenous Given 11/09/19 1226)  nitroGLYCERIN (NITROSTAT) 0.4 MG SL tablet (0.4 mg  Given 11/09/19 1235)    Pertinent labs & imaging results that were available during my care of the patient were reviewed by me and considered in my medical decision making (see chart for details).  COCHISE DINNEEN was evaluated in Emergency Department on 11/09/2019 for the symptoms described in the history of present illness. He was evaluated in the context of the global COVID-19 pandemic, which necessitated consideration that the patient might be at risk for infection with the SARS-CoV-2 virus that causes COVID-19. Institutional protocols and algorithms that pertain to the evaluation of patients at risk for COVID-19 are in a state of rapid change based on information released by regulatory bodies including the CDC and federal and state organizations. These policies and algorithms were followed during the patient's care in the ED.   Patient presents with chest pain, EKG from triage diagnostic of STEMI.  Code STEMI called at 1215, aspirin 324 chewable and heparin 4000 units IV ordered.   Received call back from Dr. Clayborn Bigness right away who presented to patient's bedside for evaluation.  Will follow recommendations.  ----------------------------------------- 12:46 PM on  11/09/2019 ----------------------------------------- Patient off to Cath Lab with cardiology at this time.  No change in pain with nitroglycerin.       ____________________________________________   FINAL CLINICAL IMPRESSION(S) / ED DIAGNOSES    Final diagnoses:  ST elevation myocardial infarction (STEMI), unspecified artery (HCC)  Precordial pain     ED Discharge Orders    None      Portions of this note were generated with dragon dictation software. Dictation errors may occur despite best attempts at proofreading.   Carrie Mew, MD 11/09/19 [place]    Carrie Mew, MD 11/09/19 1256

## 2019-11-09 NOTE — ED Notes (Signed)
Cardiologist bedside

## 2019-11-09 NOTE — Progress Notes (Signed)
   11/09/19 1150  Clinical Encounter Type  Visited With Patient;Family  Visit Type Initial  Referral From Nurse  Consult/Referral To Chaplain  Chaplain responded to CODE STEMI. When chaplain arrived, staff was working with patient, waiting for doctor to arrived. Once doctor arrived, he told patient what he was concerned about. Doctor told patient that he would take him in the cath lab, take pictures, and decide what needed to be done. Chaplain escorted patient's fiancee, Noralee Chars to the cath waiting area. Ms. Mariea Clonts was making calls and chaplain told her another chaplain will come and escort her to ICU when patient's procedure is done.

## 2019-11-09 NOTE — ED Notes (Signed)
given 3rd NTG

## 2019-11-09 NOTE — ED Notes (Signed)
En route to cath lab

## 2019-11-09 NOTE — H&P (Addendum)
History and Physical:    Alexander Cobb   PTW:[id number] DOB: November 28, 1950 DOA: 11/09/2019  Referring MD/provider: Dr. Clayborn Bigness PCP: [name], MD   Patient coming from: Home  Chief Complaint: Chest pain, STEMI  History of Present Illness:   Alexander Cobb is an 69 y.o. male with PMH significant for HTN, DM 2, CAD and HFrEF who was in his USOH till last night when he developed substernal chest pressure associated with shortness of breath and diaphoresis.  Patient thought this was likely from his heart as he had had similar symptoms before.  However he waited all night to come to the emergency room this morning.  Patient was hoping the pain would resolve however it persisted all through the night.  Patient was evaluated in the ED and repeated EKGs showed rising ST elevations inferiorly.  Code STEMI was called at 61 and patient was taken to the Cath Lab emergently.  At present patient states he feels fine.  His main concern is when he is going to be able to eat.  He denies any shortness of breath at present.  He is lying flat and denies any orthopnea or sensation of PND.  Patient notes at baseline he is able to function with no limitation, notes he is very busy and active.  ED Course:  The patient was noted to have a STEMI as noted above.  Patient was taken emergently to the Cath Lab. ROS:   ROS   Review of Systems: General: Denies fever, chills, malaise,  Respiratory: Denies cough, SOB at rest or hemoptysis GI: Denies nausea, vomiting, diarrhea or constipation GU: Denies dysuria, frequency or hematuria CNS: Denies HA, dizziness, confusion, new weakness or clumsiness. Blood/lymphatics: Denies easy bruising or bleeding Mood/affect: Denies anxiety/depression    Past Medical History:   Past Medical History:  Diagnosis Date  . CAD (coronary artery disease) 12/21/2010  . Cardiomyopathy   . CHF (congestive heart failure) ([place])   . Diabetes mellitus without complication  ([place])   . Fluttering heart 12/21/2010  . HTN (hypertension) 12/21/2010  . Hyperlipidemia   . OSA (obstructive sleep apnea)    Uses CPAP at home    Past Surgical History:   Past Surgical History:  Procedure Laterality Date  . CARDIAC CATHETERIZATION    . COLONOSCOPY WITH PROPOFOL N/A 11/07/2015   Procedure: COLONOSCOPY WITH PROPOFOL;  Surgeon: Lollie Sails, MD;  Location: [facility] ENDOSCOPY;  Service: Endoscopy;  Laterality: N/A;  . TEE WITHOUT CARDIOVERSION N/A 05/18/2018   Procedure: TRANSESOPHAGEAL ECHOCARDIOGRAM (TEE);  Surgeon: Wellington Hampshire, MD;  Location: [facility] ORS;  Service: Cardiovascular;  Laterality: N/A;    Social History:   Social History   Socioeconomic History  . Marital status: Legally Separated    Spouse name: Not on file  . Number of children: Not on file  . Years of education: Not on file  . Highest education level: Not on file  Occupational History  . Not on file  Tobacco Use  . Smoking status: Current Every Day Smoker    Packs/day: 0.75    Years: 40.00    Pack years: 30.00    Types: Cigarettes  . Smokeless tobacco: Never Used  Vaping Use  . Vaping Use: Never assessed  Substance and Sexual Activity  . Alcohol use: Not Currently  . Drug use: No  . Sexual activity: Not Currently  Other Topics Concern  . Not on file  Social History Narrative  . Not on file  Social Determinants of Health   Financial Resource Strain: Low Risk   . Difficulty of Paying Living Expenses: Not hard at all  Food Insecurity: No Food Insecurity  . Worried About Charity fundraiser in the Last Year: Never true  . Ran Out of Food in the Last Year: Never true  Transportation Needs: No Transportation Needs  . Lack of Transportation (Medical): No  . Lack of Transportation (Non-Medical): No  Physical Activity: Inactive  . Days of Exercise per Week: 0 days  . Minutes of Exercise per Session: 0 min  Stress: No Stress Concern Present  . Feeling of Stress : Not at all    Social Connections: Moderately Isolated  . Frequency of Communication with Friends and Family: More than three times a week  . Frequency of Social Gatherings with Friends and Family: More than three times a week  . Attends Religious Services: 1 to 4 times per year  . Active Member of Clubs or Organizations: No  . Attends Archivist Meetings: Never  . Marital Status: Separated  Intimate Partner Violence: Not At Risk  . Fear of Current or Ex-Partner: No  . Emotionally Abused: No  . Physically Abused: No  . Sexually Abused: No    Allergies   Sulfa drugs cross reactors  Family history:   Family History  Problem Relation Age of Onset  . COPD Father     Current Medications:   Prior to Admission medications   Medication Sig Start Date End Date Taking? Authorizing Provider  aspirin 81 MG tablet Take 81 mg by mouth daily. Reported on 06/30/2015    [provider]  blood glucose meter kit and supplies KIT Dispense based on patient and insurance preference. Use up to four times daily as directed. (FOR ICD-9 250.00, 250.01). 05/20/18   Gouru, Illene Silver, MD  ibuprofen (ADVIL,MOTRIN) 200 MG tablet Take 200 mg by mouth every 6 (six) hours as needed for mild pain or moderate pain.    [provider]  Insulin Glargine (LANTUS SOLOSTAR) 100 UNIT/ML Solostar Pen Inject 15 Units into the skin daily. 05/20/18   Nicholes Mango, MD  Insulin Pen Needle 32G X 4 MM MISC 15 Units by Does not apply route daily at 10 pm. 05/20/18   Nicholes Mango, MD  metFORMIN (GLUCOPHAGE) 500 MG tablet Take 1 tablet (500 mg total) by mouth 2 (two) times daily with a meal. 05/20/18   Gouru, Aruna, MD  metoprolol succinate (TOPROL-XL) 50 MG 24 hr tablet Take 50 mg by mouth daily. Take with or immediately following a meal.    [provider]  Multiple Vitamin (MULTIVITAMIN) tablet Take 1 tablet by mouth daily.    [provider]  nitroGLYCERIN (NITROSTAT) 0.4 MG SL tablet Place 1 tablet (0.4  mg total) under the tongue every 5 (five) minutes as needed for chest pain. 04/15/19 08/16/19  Alisa Graff, FNP  pravastatin (PRAVACHOL) 40 MG tablet Take 1 tablet (40 mg total) by mouth every evening. 08/03/19   Gollan, Kathlene November, MD  sacubitril-valsartan (ENTRESTO) 97-103 MG Take 1 tablet by mouth 2 (two) times daily. Patient not taking: Reported on 08/16/2019 02/03/19   Alisa Graff, FNP    Physical Exam:   Vitals:   11/09/19 1426 11/09/19 1500 11/09/19 1600 11/09/19 1603  BP: (!) 151/92 125/81 136/71   Pulse: 64 63 (!) 58 (!) 58  Resp: _0 Temp: 97.8 F (36.6 C)     TempSrc: Oral  SpO2: 99% 100% 100%   Weight: 89.1 kg     Height: 5' 9" (1.753 m)        Physical Exam: Blood pressure 136/71, pulse (!) 58, temperature 97.8 F (36.6 C), temperature source Oral, resp. rate 12, height 5' 9" (1.753 m), weight 89.1 kg, SpO2 100 %. Gen: Relatively well-appearing man in good spirits lying flat in bed in no acute distress Eyes: sclera anicteric, conjuctiva mildly injected bilaterally CVS: S1-S2, regulary, no gallops Respiratory: Reasonably good air entry without adventitious sounds, no rales GI: NABS, soft, NT  LE: No edema. No cyanosis, DP pulses intact bilaterally Neuro: grossly nonfocal.  Psych: patient is logical and coherent, judgement and insight appear normal, mood and affect appropriate to situation. Skin: no rashes or lesions or ulcers,    Data Review:    Labs: Basic Metabolic Panel: Recent Labs  Lab 11/09/19 1223  NA 135  K 4.2  CL 102  CO2 27  GLUCOSE 189*  BUN 16  CREATININE 1.03  CALCIUM 8.8*   Liver Function Tests: No results for input(s): AST, ALT, ALKPHOS, BILITOT, PROT, ALBUMIN in the last 168 hours. No results for input(s): LIPASE, AMYLASE in the last 168 hours. No results for input(s): AMMONIA in the last 168 hours. CBC: No results for input(s): WBC, NEUTROABS, HGB, HCT, MCV, PLT in the last 168 hours. Cardiac Enzymes: No results for  input(s): CKTOTAL, CKMB, CKMBINDEX, TROPONINI in the last 168 hours.  BNP (last 3 results) No results for input(s): PROBNP in the last 8760 hours. CBG: Recent Labs  Lab 11/09/19 1538  GLUCAP 131*    Urinalysis    Component Value Date/Time   COLORURINE STRAW (A) 05/16/2018 0111   APPEARANCEUR CLEAR (A) 05/16/2018 0111   LABSPEC 1.018 05/16/2018 0111   PHURINE 6.0 05/16/2018 0111   GLUCOSEU >=500 (A) 05/16/2018 0111   HGBUR SMALL (A) 05/16/2018 0111   BILIRUBINUR NEGATIVE 05/16/2018 0111   KETONESUR NEGATIVE 05/16/2018 0111   PROTEINUR 100 (A) 05/16/2018 0111   NITRITE NEGATIVE 05/16/2018 0111   LEUKOCYTESUR NEGATIVE 05/16/2018 0111      Radiographic Studies: No results found.  EKG: Unfortunately EKGs described in the ED have not been uploaded into the system so I am unable to review any EKGs.   Assessment/Plan:   Principal Problem:   STEMI involving right coronary artery (HCC) Active Problems:   Congestive dilated cardiomyopathy (HCC)   CAD (coronary artery disease)   HTN (hypertension)   Poorly controlled type 2 diabetes mellitus (HCC)   Chronic systolic (congestive) heart failure (HCC)   STEMI (ST elevation myocardial infarction) ([place])  STEMI Patient underwent cardiac catheterization by Dr. Clayborn Bigness  Cath showed TIMI-3 flow in PDA with significant stenosis, DES placed was in the PDA Ventriculogram gram showed EF of 30% with severe inferior akinesis Patient was placed on Angiomax drip Brilinta and aspirin are continued He is on atorvastatin Patient is presently chest pain-free Dr. [name], his primary cardiologist, will take over his care tomorrow  HFrEF Appears to be compensated at present with no SOB, saturating well on room air and is lying flat without difficulty.  Metoprolol, spironolactone have been continued.,  He has been restarted back on his Entresto  DM2 Will put patient on SSI ACHS Origin can be added back as warranted once patient's  medicines have been reconciled  HTN Medications need to be reconciled but patient is normotensive at present and is on heart failure medications as above Patient has PRN hypertension medications written for  Other information:   DVT prophylaxis: Patient is on Angiomax drip Code Status: Full Family Communication: Patient's significant other was at bedside throughout Disposition Plan: Home Consults called: Cardiology Admission status: Inpatient  [place] [facility]  If 7PM-7AM, please contact night-coverage [email or web address] Password [facility] 11/09/2019, 4:09 PM

## 2019-11-09 NOTE — ED Notes (Signed)
Pt belongings bagged and sent with fiance

## 2019-11-09 NOTE — Consult Note (Signed)
Brief consult note interventional cardiologySee patient with code STEMI ST elevation inferiorly called by Dr. Joni Fears He was given 4000 use of heparin 81 mg of aspirin. Patient's original cardiologist is Dr. [name] Patient is hemodynamically stable but hypertensive in the emergency room with prolonged episode of chest pain which started the evening before but he finally came to the emergency room with 8 out of 10 chest pain He has known coronary disease and a known cardiomyopathy last reported EF was around 40 to 45% After he evaluated patient emergency room I agreed to bring patient to the cardiac Cath Lab for code STEMI with emergent cardiac cath with possible intervention with intention to treat Patient was found to have moderate disease in his left system particularly in the circumflex LAD appeared to be large and relatively free of disease RCA anterior takeoff was found to have a significant lesion in the PDA Long 99 but TIMI-3 flow After reviewing the films we decided to proceed with intervention Patient had DES placed successfully in the PDA 2.25 x 30 mm resolute Onyx Left ventriculogram showed severely depressed left ventricular function EF of around 30% with severe inferior akinesis ventricular enlargement Patient was placed on Brilinta aspirin Angiomax for an additional 2 hours His groin access was sealed with a minx but the PAD was added Patient was then transported to ICU Hospitalist service was contacted Dr. Jamse Arn I expect the patient's cardiology care to be transferred to [facility] either later today or tomorrow at the latest since  [facility] is his cardiologist Full consult and interventional report to follow shortly

## 2019-11-09 NOTE — Progress Notes (Signed)
Ch visited with Pt and Pt's fiance Ann after Pt had been moved to the ICU. Pt spoke about not being able to eat anything. Pt and family well supported. Ch let them know about anytime chaplain availability if any further support is needed.

## 2019-11-10 ENCOUNTER — Inpatient Hospital Stay (HOSPITAL_COMMUNITY)
Admit: 2019-11-10 | Discharge: 2019-11-10 | Disposition: A | Discharge status: Home / Self Care | Payer: Medicare HMO | Attending: Internal Medicine | Admitting: Internal Medicine

## 2019-11-10 ENCOUNTER — Encounter: Payer: Self-pay | Admitting: Internal Medicine

## 2019-11-10 ENCOUNTER — Telehealth: Payer: Self-pay

## 2019-11-10 DIAGNOSIS — I213 ST elevation (STEMI) myocardial infarction of unspecified site: Secondary | ICD-10-CM

## 2019-11-10 DIAGNOSIS — I2111 ST elevation (STEMI) myocardial infarction involving right coronary artery: Principal | ICD-10-CM

## 2019-11-10 DIAGNOSIS — R072 Precordial pain: Secondary | ICD-10-CM

## 2019-11-10 DIAGNOSIS — I5022 Chronic systolic (congestive) heart failure: Secondary | ICD-10-CM

## 2019-11-10 DIAGNOSIS — Z720 Tobacco use: Secondary | ICD-10-CM

## 2019-11-10 DIAGNOSIS — I2510 Atherosclerotic heart disease of native coronary artery without angina pectoris: Secondary | ICD-10-CM

## 2019-11-10 DIAGNOSIS — I429 Cardiomyopathy, unspecified: Secondary | ICD-10-CM

## 2019-11-10 LAB — LIPID PANEL
Cholesterol: 138 mg/dL (ref 0–200)
HDL: 41 mg/dL (ref >40)
LDL Cholesterol: 76 mg/dL (ref 0–99)
Total CHOL/HDL Ratio: 3.4 RATIO
Triglycerides: 105 mg/dL (ref <150)
VLDL: 21 mg/dL (ref 0–40)

## 2019-11-10 LAB — BASIC METABOLIC PANEL
Anion gap: 8 (ref 5–15)
BUN: 21 mg/dL (ref 8–23)
CO2: 26 mmol/L (ref 22–32)
Calcium: 8.8 mg/dL — ABNORMAL LOW (ref 8.9–10.3)
Chloride: 103 mmol/L (ref 98–111)
Creatinine, Ser: 1.46 mg/dL — ABNORMAL HIGH (ref 0.61–1.24)
GFR calc Af Amer: 56 mL/min — ABNORMAL LOW (ref >60)
GFR calc non Af Amer: 48 mL/min — ABNORMAL LOW (ref >60)
Glucose, Bld: 176 mg/dL — ABNORMAL HIGH (ref 70–99)
Potassium: 4.8 mmol/L (ref 3.5–5.1)
Sodium: 137 mmol/L (ref 135–145)

## 2019-11-10 LAB — POCT ACTIVATED CLOTTING TIME: Activated Clotting Time: 428 seconds

## 2019-11-10 LAB — ECHOCARDIOGRAM COMPLETE
AR max vel: 3.3 cm2
AV Area VTI: 3.6 cm2
AV Area mean vel: 2.92 cm2
AV Mean grad: 3 mmHg
AV Peak grad: 4.5 mmHg
Ao pk vel: 1.06 m/s
Area-P 1/2: 3.68 cm2
Height: 69 in
S' Lateral: 3.58 cm
Weight: 3142.88 oz

## 2019-11-10 LAB — CBC
HCT: 44.7 % (ref 39.0–52.0)
Hemoglobin: 15.9 g/dL (ref 13.0–17.0)
MCH: 31 pg (ref 26.0–34.0)
MCHC: 35.6 g/dL (ref 30.0–36.0)
MCV: 87.1 fL (ref 80.0–100.0)
Platelets: 177 10*3/uL (ref 150–400)
RBC: 5.13 MIL/uL (ref 4.22–5.81)
RDW: 12.9 % (ref 11.5–15.5)
WBC: 11.9 10*3/uL — ABNORMAL HIGH (ref 4.0–10.5)
nRBC: 0 % (ref 0.0–0.2)

## 2019-11-10 LAB — HEMOGLOBIN A1C
Hgb A1c MFr Bld: 7.6 % — ABNORMAL HIGH (ref 4.8–5.6)
Mean Plasma Glucose: 171.42 mg/dL

## 2019-11-10 LAB — GLUCOSE, CAPILLARY
Glucose-Capillary: 177 mg/dL — ABNORMAL HIGH (ref 70–99)
Glucose-Capillary: 256 mg/dL — ABNORMAL HIGH (ref 70–99)

## 2019-11-10 LAB — HIV ANTIBODY (ROUTINE TESTING W REFLEX): HIV Screen 4th Generation wRfx: NONREACTIVE

## 2019-11-10 MED ORDER — TICAGRELOR 90 MG PO TABS
90.0000 mg | ORAL_TABLET | Freq: Two times a day (BID) | ORAL | 0 refills | Status: DC
Start: 2019-11-10 — End: 2019-11-12

## 2019-11-10 MED ORDER — ATORVASTATIN CALCIUM 80 MG PO TABS
80.0000 mg | ORAL_TABLET | Freq: Every day | ORAL | 0 refills | Status: DC
Start: 2019-11-11 — End: 2019-11-12

## 2019-11-10 MED ORDER — CHLORHEXIDINE GLUCONATE CLOTH 2 % EX PADS: 6.0000 | MEDICATED_PAD | Freq: Every day | CUTANEOUS | Status: DC

## 2019-11-10 MED ORDER — SPIRONOLACTONE 25 MG PO TABS
12.5000 mg | ORAL_TABLET | Freq: Every day | ORAL | 0 refills | Status: DC
Start: 2019-11-11 — End: 2019-11-12

## 2019-11-10 NOTE — Discharge Instructions (Signed)
Heart Attack A heart attack occurs when blood and oxygen supply to the heart is cut off. A heart attack causes damage to the heart that cannot be fixed. A heart attack is also called a myocardial infarction, or MI. If you think you are having a heart attack, do not wait to see if the symptoms will go away. Get medical help right away. What are the causes? This condition may be caused by:  A fatty substance (plaque) in the blood vessels (arteries). This can block the flow of blood to the heart.  A blood clot in the blood vessels that go to the heart. The blood clot blocks blood flow.  Low blood pressure.  An abnormal heartbeat.  Some diseases, such as problems in red blood cells (anemia)orproblems in breathing (respiratory failure).  Tightening (spasm) of a blood vessel that cuts off blood to the heart.  A tear in a blood vessel of the heart.  High blood pressure. What increases the risk? The following factors may make you more likely to develop this condition:  Aging. The older you are, the higher your risk.  Having a personal or family history of chest pain, heart attack, stroke, or narrowing of the arteries in the legs, arms, head, or stomach (peripheral artery disease).  Being male.  Smoking.  Not getting regular exercise.  Being overweight or obese.  Having high blood pressure.  Having high cholesterol.  Having diabetes.  Drinking too much alcohol.  Using illegal drugs, such as cocaine or methamphetamine. What are the signs or symptoms? Symptoms of this condition include:  Chest pain. It may feel like: ? Crushing or squeezing. ? Tightness, pressure, fullness, or heaviness.  Pain in the arm, neck, jaw, back, or upper body.  Shortness of breath.  Heartburn.  Upset stomach (indigestion).  Feeling like you may vomit (nauseous).  Cold sweats.  Feeling tired.  Sudden light-headedness. How is this treated? A heart attack must be treated as soon as  possible. Treatment may include:  Medicines to: ? Break up or dissolve blood clots. ? Thin blood and help prevent blood clots. ? Treat blood pressure. ? Improve blood flow to the heart. ? Reduce pain. ? Reduce cholesterol.  Procedures to widen a blocked artery and keep it open.  Open heart surgery.  Receiving oxygen.  Making your heart strong again (cardiac rehabilitation) through exercise, education, and counseling. Follow these instructions at home: Medicines  Take over-the-counter and prescription medicines only as told by your doctor. You may need to take medicine: ? To keep your blood from clotting too easily. ? To control blood pressure. ? To lower cholesterol. ? To control heart rhythms.  Do not take these medicines unless your doctor says it is okay: ? NSAIDs, such as ibuprofen. ? Supplements that have vitamin A, vitamin E, or both. ? Hormone replacement therapy that has estrogen with or without progestin. Lifestyle      Do not use any products that have nicotine or tobacco, such as cigarettes, e-cigarettes, and chewing tobacco. If you need help quitting, ask your doctor.  Avoid secondhand smoke.  Exercise regularly. Ask your doctor about a cardiac rehab program.  Eat heart-healthy foods. Your doctor will tell you what foods to eat.  Stay at a healthy weight.  Lower your stress level.  Do not use illegal drugs. Alcohol use  Do not drink alcohol if: ? Your doctor tells you not to drink. ? You are pregnant, may be pregnant, or are planning to become pregnant.    If you drink alcohol: ? Limit how much you use to:  0-1 drink a day for women.  0-2 drinks a day for men. ? Know how much alcohol is in your drink. In the [place] one drink equals one 12 oz bottle of beer (355 mL), one 5 oz glass of wine (148 mL), or one 1 oz glass of hard liquor (44 mL). General instructions  Work with your doctor to treat other problems you may have, such as diabetes or high  blood pressure.  Get screened for depression. Get treatment if needed.  Keep your vaccines up to date. Get the flu shot (influenza vaccine) every year.  Keep all follow-up visits as told by your doctor. This is important. Contact a doctor if:  You feel very sad.  You have trouble doing your daily activities. Get help right away if:  You have sudden, unexplained discomfort in your chest, arms, back, neck, jaw, or upper body.  You have shortness of breath.  You have sudden sweating or clammy skin.  You feel like you may vomit.  You vomit.  You feel tired or weak.  You get light-headed or dizzy.  You feel your heart beating fast.  You feel your heart skipping beats.  You have blood pressure that is higher than 180/120. These symptoms may be an emergency. Do not wait to see if the symptoms will go away. Get medical help right away. Call your local emergency services (911 in the [place] Do not drive yourself to the hospital. Summary  A heart attack occurs when blood and oxygen supply to the heart is cut off.  Do not take NSAIDs unless your doctor says it is okay.  Do not smoke. Avoid secondhand smoke.  Exercise regularly. Ask your doctor about a cardiac rehab program. This information is not intended to replace advice given to you by your health care provider. Make sure you discuss any questions you have with your health care provider. Document Revised: 06/08/2018 Document Reviewed: 06/08/2018 Elsevier Patient Education  [place].  Acute Coronary Syndrome Acute coronary syndrome (ACS) is a serious problem in which there is suddenly not enough blood and oxygen reaching the heart. ACS can result in chest pain or a heart attack. This condition is a medical emergency. If you have any symptoms of this condition, get help right away. What are the causes? This condition may be caused by:  A buildup of fat and cholesterol inside the arteries (atherosclerosis). This is  the most common cause. The buildup (plaque) can cause blood vessels in the heart (coronary arteries) to become narrow or blocked, which reduces blood flow to the heart. Plaque can also break off and lead to a clot, which can block an artery and cause a heart attack or stroke.  Sudden tightening of the muscles around the coronary arteries (coronary spasm).  Tearing of a coronary artery (spontaneous coronary artery dissection).  Very low blood pressure (hypotension).  An abnormal heartbeat (arrhythmia).  Other medical conditions that cause a decrease of oxygen to the heart, such as anemiaorrespiratory failure.  Using cocaine or methamphetamine. What increases the risk? The following factors may make you more likely to develop this condition:  Age. The risk for ACS increases as you get older.  History of chest pain, heart attack, peripheral artery disease, or stroke.  Having taken chemotherapy or immune-suppressing medicines.  Being male.  Family history of chest pain, heart disease, or stroke.  Smoking.  Not exercising enough.  Being overweight.  High cholesterol.  High blood pressure (hypertension).  Diabetes.  Excessive alcohol use. What are the signs or symptoms? Common symptoms of this condition include:  Chest pain. The pain may last a long time, or it may stop and come back (recur). It may feel like: ? Crushing or squeezing. ? Tightness, pressure, fullness, or heaviness.  Arm, neck, jaw, or back pain.  Heartburn or indigestion.  Shortness of breath.  Nausea.  Sudden cold sweats.  Light-headedness.  Dizziness or passing out.  Tiredness (fatigue). Sometimes there are no symptoms. How is this diagnosed? This condition may be diagnosed based on:  Your medical history and symptoms.  Imaging tests, such as: ? An electrocardiogram (ECG). This measures the heart's electrical activity. ? X-rays. ? CT scan. ? A coronary angiogram. For this test, dye is  injected into the heart arteries and then X-rays are taken. ? Myocardial perfusion imaging. This test shows how well blood flows through your heart muscle.  Blood tests. These may be repeated at certain time intervals.  Exercise stress testing.  Echocardiogram. This is a test that uses sound waves to produce detailed images of the heart. How is this treated? Treatment for this condition may include:  Oxygen therapy.  Medicines, such as: ? Antiplatelet medicines and blood-thinning medicines, such as aspirin. These help prevent blood clots. ? Medicine that dissolves any blood clots (fibrinolytic therapy). ? Blood pressure medicines. ? Nitroglycerin. This helps widen blood vessels to improve blood flow. ? Pain medicine. ? Cholesterol-lowering medicine.  Surgery, such as: ? Coronary angioplasty with stent placement. This involves placing a small piece of metal that looks like mesh or a spring into a narrow coronary artery. This widens the artery and keeps it open. ? Coronary artery bypass surgery. This involves taking a section of a blood vessel from a different part of your body and placing it on the blocked coronary artery to allow blood to flow around the blockage.  Cardiac rehabilitation. This is a program that includes exercise training, education, and counseling to help you recover. Follow these instructions at home: Eating and drinking  Eat a heart-healthy diet that includes whole grains, fruits and vegetables, lean proteins, and low-fat or nonfat dairy products.  Limit how much salt (sodium) you eat as told by your health care provider. Follow instructions from your health care provider about any other eating or drinking restrictions, such as limiting foods that are high in fat and processed sugars.  Use healthy cooking methods such as roasting, grilling, broiling, baking, poaching, steaming, or stir-frying.  Work with a dietitian to follow a heart-healthy eating  plan. Medicines  Take over-the-counter and prescription medicines only as told by your health care provider.  Do not take these medicines unless your health care provider approves: ? Vitamin supplements that contain vitamin A or vitamin E. ? NSAIDs, such as ibuprofen, naproxen, or celecoxib. ? Hormone replacement therapy that contains estrogen.  If you are taking blood thinners: ? Talk with your health care provider before you take any medicines that contain aspirin or NSAIDs. These medicines increase your risk for dangerous bleeding. ? Take your medicine exactly as told, at the same time every day. ? Avoid activities that could cause injury or bruising, and follow instructions about how to prevent falls. ? Wear a medical alert bracelet, and carry a card that lists what medicines you take. Activity  Follow your cardiac rehabilitation program. Do exercises as told by your physical therapist.  Ask your health care provider what activities  and exercises are safe for you. Follow his or her instructions about lifting, driving, or climbing stairs. Lifestyle  Do not use any products that contain nicotine or tobacco, such as cigarettes, e-cigarettes, and chewing tobacco. If you need help quitting, ask your health care provider.  Do not drink alcohol if your health care provider tells you not to drink.  If you drink alcohol: ? Limit how much you have to 0-1 drink a day. ? Be aware of how much alcohol is in your drink. In the [place] one drink equals one 12 oz bottle of beer (355 mL), one 5 oz glass of wine (148 mL), or one 1 oz glass of hard liquor (44 mL).  Maintain a healthy weight. If you need to lose weight, work with your health care provider to do so safely. General instructions  Tell all the health care providers who provide care for you about your heart condition, including your dentist. This may affect the medicines or treatment you receive.  Manage any other health conditions you  have, such as hypertension or diabetes. These conditions affect your heart.  Pay attention to your mental health. You may be at higher risk for depression. ? Find ways to manage stress. ? Talk to your health care provider about depression screening and treatment.  Keep your vaccinations up to date. ? Get the flu shot (influenza vaccine) every year. ? Get the pneumococcal vaccine if you are age 34 or older.  If directed, monitor your blood pressure at home.  Keep all follow-up visits as told by your health care provider. This is important. Contact a health care provider if you:  Feel overwhelmed or sad.  Have trouble doing your daily activities. Get help right away if you:  Have pain in your chest, neck, arm, jaw, stomach, or back that recurs, and: ? It lasts for more than a few minutes. ? It is not relieved by taking the [place] health care provider prescribed.  Have unexplained: ? Heavy sweating. ? Heartburn or indigestion. ? Nausea or vomiting. ? Shortness of breath. ? Difficulty breathing. ? Fatigue. ? Nervousness or anxiety. ? Weakness. ? Diarrhea. ? Dark stools or blood in your stool.  Have sudden light-headedness or dizziness.  Have blood pressure that is higher than 180/120.  Faint.  Have thoughts about hurting yourself. These symptoms may represent a serious problem that is an emergency. Do not wait to see if the symptoms will go away. Get medical help right away. Call your local emergency services (911 in the [place] Do not drive yourself to the hospital.  Summary  Acute coronary syndrome (ACS) is when there is not enough blood and oxygen being supplied to the heart. ACS can result in chest pain or a heart attack.  Acute coronary syndrome is a medical emergency. If you have any symptoms of this condition, get help right away.  Treatment includes medicines and procedures to open the blocked arteries and restore blood flow. This information is not  intended to replace advice given to you by your health care provider. Make sure you discuss any questions you have with your health care provider. Document Revised: 07/29/2018 Document Reviewed: 03/09/2018 Elsevier Patient Education  2020 [employer].

## 2019-11-10 NOTE — Telephone Encounter (Signed)
-----   Message from Rise Mu, [place] sent at 11/10/2019 11:09 AM EDT ----- Patient will need TCM follow up in 10 days and a follow up BMET on 11/12/2019. Thanks!

## 2019-11-10 NOTE — Progress Notes (Signed)
*  PRELIMINARY RESULTS* Echocardiogram 2D Echocardiogram has been performed.  Alexander Cobb 11/10/2019, 10:55 AM

## 2019-11-10 NOTE — Telephone Encounter (Signed)
The patient is still currently admitted as of this time. Will re-evaluate discharge status tomorrow with plans for a TCM call to the patient.

## 2019-11-10 NOTE — Progress Notes (Signed)
Discharge instructions reviewed with patient and fiancee. IVs removed without complications. Brilinta card provided to patient. Patient escorted via w/c to private vehicle.

## 2019-11-10 NOTE — Progress Notes (Signed)
Patient ambulated in hall. Denies chest pain, SOB, or light headedness. Remained SR 70-80's on monitor during ambulation. O2 saturation 94%. Dressing changed prior to ambulation and intact with no evidence of bleeding s/p walking.

## 2019-11-10 NOTE — Progress Notes (Signed)
Progress Note  Patient Name: Alexander Cobb Date of Encounter: 11/10/2019  Primary Cardiologist: [name]  Subjective   No chest pain, SOB, palpitations, dizziness, presyncope, or syncope.   Inpatient Medications    Scheduled Meds: . aspirin  81 mg Oral Daily  . atorvastatin  80 mg Oral Daily  . Chlorhexidine Gluconate Cloth  6 each Topical Daily  . insulin aspart  0-9 Units Subcutaneous TID WC  . metoprolol succinate  50 mg Oral Daily  . sacubitril-valsartan  1 tablet Oral BID  . sodium chloride flush  3 mL Intravenous Q12H  . spironolactone  12.5 mg Oral Daily  . ticagrelor  90 mg Oral BID   Continuous Infusions: . sodium chloride     PRN Meds: sodium chloride, acetaminophen, ondansetron (ZOFRAN) IV, sodium chloride flush   Vital Signs    Vitals:   11/10/19 0800 11/10/19 0900 11/10/19 1000 11/10/19 1100  BP: (!) 108/49 127/65 135/61 124/63  Pulse: 71 72 71 70  Resp: 14     Temp: 98.2 F (36.8 C)     TempSrc: Oral     SpO2: 99% 91% 94% 94%  Weight:      Height:        Intake/Output Summary (Last 24 hours) at 11/10/2019 1104 Last data filed at 11/10/2019 0500 Gross per 24 hour  Intake --  Output 1300 ml  Net -1300 ml   Filed Weights   11/09/19 1426  Weight: 89.1 kg    Telemetry    SR - Personally Reviewed  ECG    NSR, 71 bpm, 75mm inferior ST segment elevation with reciprocal lateral st/t - Personally Reviewed  Physical Exam   GEN: No acute distress.   Neck: No JVD. Cardiac: RRR, no murmurs, rubs, or gallops. Right femoral cardiac cath site is without bleeding, swelling, warmth, erythema, bruising, or TTP. No bruit.  Respiratory: Clear to auscultation bilaterally.  GI: Soft, nontender, non-distended.   MS: No edema; No deformity. Neuro:  Alert and oriented x 3; Nonfocal.  Psych: Normal affect.  Labs    Chemistry Recent Labs  Lab 11/09/19 1223 11/10/19 0734  NA 135 137  K 4.2 4.8  CL 102 103  CO2 27 26  GLUCOSE 189* 176*  BUN 16 21   CREATININE 1.03 1.46*  CALCIUM 8.8* 8.8*  GFRNONAA >60 48*  GFRAA >60 56*  ANIONGAP 6 8     Hematology Recent Labs  Lab 11/10/19 0734  WBC 11.9*  RBC 5.13  HGB 15.9  HCT 44.7  MCV 87.1  MCH 31.0  MCHC 35.6  RDW 12.9  PLT 177    Cardiac EnzymesNo results for input(s): TROPONINI in the last 168 hours. No results for input(s): TROPIPOC in the last 168 hours.   BNPNo results for input(s): BNP, PROBNP in the last 168 hours.   DDimer No results for input(s): DDIMER in the last 168 hours.   Radiology    N/A  Cardiac Studies   LHC 11/09/2019:  Prox Cx lesion is 75% stenosed.  Mid Cx lesion is 75% stenosed.  RPDA lesion is 99% stenosed.  Prox RCA lesion is 30% stenosed.  Mid RCA lesion is 30% stenosed.  A drug-eluting stent was successfully placed using a STENT RESOLUTE ONYX 2.25X30.  Post intervention, there is a 0% residual stenosis.  Severely depressed overall left ventricular function around 25 to 30% with inferior akinesis left ventricular enlargement   Conclusion Code STEMI presentation from the emergency room Patient presented with inferior  ST elevation After evaluation emergency room patient was brought to the cardiac Cath Lab for diagnostic cardiac cath possible PCI and stent LV function is globally depressed at around 25 to 30% with inferior akinesis Coronaries Long left main relatively free of disease LAD is moderate in size relatively free of disease Circumflex is moderate in size with OM1 with tandem 75% lesions RCA was a large vessel with anterior takeoff with mild to moderate disease proximal to mid but the PDA was diffusely diseased focally of around 99% long lesion TIMI-3 flow Successful PCI and stent of PDA with a 2.25 x 30 mm resolute Onyx reducing the lesion from 99 down to 0% Patient was maintained on Brilinta and aspirin Patient received Angiomax during the procedure for anticoagulation Right femoral artery was minx __________  2D echo  06/15/2019: 1. Left ventricular ejection fraction, by estimation, is 45 to 50%. The  left ventricle has mildly decreased function. The left ventricle  demonstrates global hypokinesis. There is moderate asymmetric left  ventricular hypertrophy of the basal-septal  segment. Left ventricular diastolic parameters are consistent with Grade I  diastolic dysfunction (impaired relaxation). Elevated left atrial  pressure. The average left ventricular global longitudinal strain is -12.0  %.   2. Right ventricular systolic function is normal. The right ventricular  size is normal. Tricuspid regurgitation signal is inadequate for assessing  PA pressure.   3. Left atrial size was mildly dilated.   4. Right atrial size was mildly dilated.   5. The mitral valve is normal in structure. Trivial mitral valve  regurgitation. No evidence of mitral stenosis.   6. The aortic valve is tricuspid. Aortic valve regurgitation is not  visualized. No aortic stenosis is present.   7. The inferior vena cava is dilated in size with >50% respiratory  variability, suggesting right atrial pressure of 8 mmHg. __________  TEE 05/18/2018: 1. The left ventricle has moderately reduced systolic function, with an  ejection fraction of 35-40%. The cavity size was normal. Left ventricular  diastolic function could not be evaluated.   2. The right ventricle has normal systolc function. The cavity was  normal. There is no increase in right ventricular wall thickness.   3. No vegetations noted. __________  2D echo 05/16/2018: 1. The left ventricle has moderate-severely reduced systolic function,  with an ejection fraction of 30-35%. The cavity size was normal. Left  ventricular diastolic Doppler parameters are consistent with impaired  relaxation Left ventrical global  hypokinesis without regional wall motion abnormalities.   2. The right ventricle has normal systolic function. The cavity was  normal. There is no increase in right  ventricular wall thickness. Unable  to estimated RVSP  __________  Event monitor 11/2014: Event monitor NSR with one run of SVT, 11 beats, rate 160 bpm Otherwise no significant arrhythmia __________  2D echo 12/25/2010: - Left ventricle: The cavity size was normal. There was mild    concentric hypertrophy. Systolic function was mildly to    moderately reduced. The estimated ejection fraction was in    the range of 40% to 45%. Diffuse hypokinesis. Regional    wall motion abnormalities cannot be excluded. Doppler    parameters are consistent with abnormal left ventricular    relaxation (grade 1 diastolic dysfunction).  - Right ventricle: Systolic function was normal.  - Pulmonary arteries: Systolic pressure was within the    normal range.  __________  [facility] 08/07/2010: Proximal RCA 30%, rPDA 75%, EF 33% with moderate anterolateral HK and moderate apical  HK  Patient Profile     69 y.o. male with history of HFrEF, DM2, tobacco use, HTN, and HLD admitted with an inferior STEMI s/p PCI/DES to the rPDA 11/09/2019.   Assessment & Plan    1. CAD involving the native coronary arteries with inferior STEMI: -Currently, chest pain free -Status post PCI/DES to the PDA by Dr. Clayborn Bigness 11/09/2019 -Continue DAPT without interruption for at least the next 12 months with ASA and Brilinta, importance discussed in detail -Care manager has been consulted for Brilinta card -Right femoral bandage will need to be changed prior to discharge -Echo pending -Lipitor -Metoprolol  -Can transfer to 2A today with possible discharge 9/2 -Cardiac rehab -Post cath instructions -After discussion with rounding MD, patient may be discharged if ambulates without issues this afternoon   2. 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 AKI: -Gentle hydration -Recheck BMP in the office 9/3  4. HTN: -Blood pressure is well controlled -Continue medical therapy as outlined above  5. HLD: -Last LDL of 118 from 06/2014 -Check fasting lipid panel with goal LDL being < 70 -Lipitor 80 mg  6. DM2: -A1c 7.6 this admission -SSI per IM  7. Leukocytosis: -No obvious signs of infection, likely inflammatory in the setting of his STEMI  8. Tobacco use: -Complete cessation  For questions or updates, please contact [facility] Please consult [email or web address] for contact info under Cardiology/STEMI.    Signed, Christell Faith, PA-C [place] Pager: [phone] 11/10/2019, 11:04 AM

## 2019-11-12 ENCOUNTER — Other Ambulatory Visit
Admission: RE | Admit: 2019-11-12 | Discharge: 2019-11-12 | Disposition: A | Discharge status: Home / Self Care | Payer: Medicare HMO | Attending: Nurse Practitioner | Admitting: Nurse Practitioner

## 2019-11-12 ENCOUNTER — Ambulatory Visit (INDEPENDENT_AMBULATORY_CARE_PROVIDER_SITE_OTHER): Payer: Medicare HMO | Admitting: Nurse Practitioner

## 2019-11-12 ENCOUNTER — Other Ambulatory Visit: Payer: Self-pay

## 2019-11-12 ENCOUNTER — Encounter: Payer: Self-pay | Admitting: Nurse Practitioner

## 2019-11-12 VITALS — BP 158/80 | HR 78 | Ht 69.0 in | Wt 188.1 lb

## 2019-11-12 DIAGNOSIS — E785 Hyperlipidemia, unspecified: Secondary | ICD-10-CM

## 2019-11-12 DIAGNOSIS — Z720 Tobacco use: Secondary | ICD-10-CM

## 2019-11-12 DIAGNOSIS — I5042 Chronic combined systolic (congestive) and diastolic (congestive) heart failure: Secondary | ICD-10-CM

## 2019-11-12 DIAGNOSIS — I213 ST elevation (STEMI) myocardial infarction of unspecified site: Secondary | ICD-10-CM | POA: Diagnosis not present

## 2019-11-12 DIAGNOSIS — I2510 Atherosclerotic heart disease of native coronary artery without angina pectoris: Secondary | ICD-10-CM

## 2019-11-12 DIAGNOSIS — I255 Ischemic cardiomyopathy: Secondary | ICD-10-CM

## 2019-11-12 DIAGNOSIS — I10 Essential (primary) hypertension: Secondary | ICD-10-CM

## 2019-11-12 DIAGNOSIS — I5022 Chronic systolic (congestive) heart failure: Secondary | ICD-10-CM | POA: Diagnosis present

## 2019-11-12 LAB — BASIC METABOLIC PANEL
Anion gap: 8 (ref 5–15)
BUN: 27 mg/dL — ABNORMAL HIGH (ref 8–23)
CO2: 26 mmol/L (ref 22–32)
Calcium: 9.1 mg/dL (ref 8.9–10.3)
Chloride: 101 mmol/L (ref 98–111)
Creatinine, Ser: 1.29 mg/dL — ABNORMAL HIGH (ref 0.61–1.24)
GFR calc Af Amer: >60 mL/min (ref >60)
GFR calc non Af Amer: 56 mL/min — ABNORMAL LOW (ref >60)
Glucose, Bld: 213 mg/dL — ABNORMAL HIGH (ref 70–99)
Potassium: 4.5 mmol/L (ref 3.5–5.1)
Sodium: 135 mmol/L (ref 135–145)

## 2019-11-12 MED ORDER — TICAGRELOR 90 MG PO TABS
90.0000 mg | ORAL_TABLET | Freq: Two times a day (BID) | ORAL | 3 refills | Status: DC
Start: 2019-11-12 — End: 2019-12-09

## 2019-11-12 MED ORDER — CARVEDILOL 6.25 MG PO TABS: 6.2500 mg | ORAL_TABLET | Freq: Two times a day (BID) | ORAL | 3 refills | Status: DC

## 2019-11-12 MED ORDER — SPIRONOLACTONE 25 MG PO TABS
25.0000 mg | ORAL_TABLET | Freq: Every day | ORAL | 6 refills | Status: DC
Start: 2019-11-12 — End: 2020-02-08

## 2019-11-12 MED ORDER — ATORVASTATIN CALCIUM 80 MG PO TABS
80.0000 mg | ORAL_TABLET | Freq: Every day | ORAL | 3 refills | Status: DC
Start: 2019-11-12 — End: 2020-12-01

## 2019-11-12 NOTE — Discharge Summary (Signed)
[facility] [place] NAME: Alexander Cobb    MR#:  [id number]  DATE OF BIRTH:  1950/05/06  DATE OF ADMISSION:  11/09/2019   ADMITTING PHYSICIAN: Vashti Hey, MD  DATE OF DISCHARGE: 11/10/2019  3:37 PM  PRIMARY CARE PHYSICIAN: [name], MD   ADMISSION DIAGNOSIS:  STEMI involving right coronary artery (HCC) [I21.11] STEMI (ST elevation myocardial infarction) ([place]) [I21.3] DISCHARGE DIAGNOSIS:  Principal Problem:   STEMI involving right coronary artery ([place]) Active Problems:   Congestive dilated cardiomyopathy (HCC)   CAD (coronary artery disease)   HTN (hypertension)   Precordial pain   Poorly controlled type 2 diabetes mellitus (HCC)   Chronic systolic (congestive) Cobb failure (HCC)   STEMI (ST elevation myocardial infarction) ([place])  SECONDARY DIAGNOSIS:   Past Medical History:  Diagnosis Date  . CAD (coronary artery disease)    a. 12/2010 Cath: nonobs dzs; b. STEMI/PCI: LM nl, LAD nl, LCX 75p/m, RCA 30p, 27md, RPDA 99 (2.25x30 Resolute Onyx DES).  . Diabetes mellitus without complication ([place]   . HFrEF (Cobb failure with reduced ejection fraction) ([place]    a. 07/2010 LV gram: EF 33%; b. 05/2018 Echo: EF 30-35%; c. 05/2018 TEE: EF 35-40%; d. 11/2019 Echo: EF 40-45%, gr1 DD. Antlat and inf HK. Nl RV fxn.  .Marland KitchenHTN (hypertension) 12/21/2010  . Hyperlipidemia   . Mixed Ischemic and non-ischemic cardiomyopathy    a. 07/2010 LV gram: EF 33%; b. 05/2018 Echo: EF 30-35%; c. 05/2018 TEE: EF 35-40%; d. 11/2019 Echo: EF 40-45%.  . OSA (obstructive sleep apnea)    Uses CPAP at home  . PSVT (paroxysmal supraventricular tachycardia) ([place] 12/21/2010  . Tobacco abuse    HOSPITAL COURSE:  69year old male with a history of coronary disease status post non-STEMI in 2012, hypertension, hyperlipidemia, current smoker, cardiomyopathy, diabetes, obstructive sleep apnea, still for his bacteremia is admitted for acute inferior  STEMI.  Inferior ST elevation MI Found to have a 99% stenosis in the PDA s/p cath and placement of drug-eluting stent to the PDA successfully with resolution of symptoms Continue aspirin, Brilinta, statin -Has mild right groin tenderness which is expected. Echo showing EF of 40 to 45%  HFrEF Continue Entresto  DM2 Continue home diabetes medication, hemoglobin A1c of 7.6 on 11/09/2019 Adjust medication as an outpatient per PCP  HTN Continue Entresto, adjust medication as an outpatient depending on blood pressure control  Tobacco abuse Counseled for smoking cessation    DISCHARGE CONDITIONS:  Stable CONSULTS OBTAINED:  Treatment Team:  AKate Sable MD DRUG ALLERGIES:   Allergies  Allergen Reactions  . Sulfa Drugs Cross Reactors Hives    Hives    DISCHARGE MEDICATIONS:   Allergies as of 11/10/2019      Reactions   Sulfa Drugs Cross Reactors Hives   Hives      Medication List    STOP taking these medications   ibuprofen 200 MG tablet Commonly known as: ADVIL   pravastatin 40 MG tablet Commonly known as: PRAVACHOL   sacubitril-valsartan 97-103 MG Commonly known as: ENTRESTO     TAKE these medications   aspirin 81 MG tablet Take 81 mg by mouth daily. Reported on 06/30/2015   blood glucose meter kit and supplies Kit Dispense based on patient and insurance preference. Use up to four times daily as directed. (FOR ICD-9 250.00, 250.01).   insulin glargine 100 UNIT/ML Solostar Pen Commonly known as: Lantus SoloStar Inject 15 Units  into the skin daily.   Insulin Pen Needle 32G X 4 MM Misc 15 Units by Does not apply route daily at 10 pm.   metFORMIN 500 MG tablet Commonly known as: Glucophage Take 1 tablet (500 mg total) by mouth 2 (two) times daily with a meal.   multivitamin tablet Take 1 tablet by mouth daily.   nitroGLYCERIN 0.4 MG SL tablet Commonly known as: NITROSTAT Place 1 tablet (0.4 mg total) under the tongue every 5 (five) minutes as  needed for chest pain.      DISCHARGE INSTRUCTIONS:   DIET:  Low fat, Low cholesterol diet DISCHARGE CONDITION:  Stable ACTIVITY:  Activity as tolerated OXYGEN:  Home Oxygen: No.  Oxygen Delivery: room air DISCHARGE LOCATION:  home   If you experience worsening of your admission symptoms, develop shortness of breath, life threatening emergency, suicidal or homicidal thoughts you must seek medical attention immediately by calling 911 or calling your MD immediately  if symptoms less severe.  You Must read complete instructions/literature along with all the possible adverse reactions/side effects for all the Medicines you take and that have been prescribed to you. Take any new Medicines after you have completely understood and accpet all the possible adverse reactions/side effects.   Please note  You were cared for by a hospitalist during your hospital stay. If you have any questions about your discharge medications or the care you received while you were in the hospital after you are discharged, you can call the unit and asked to speak with the hospitalist on call if the hospitalist that took care of you is not available. Once you are discharged, your primary care physician will handle any further medical issues. Please note that NO REFILLS for any discharge medications will be authorized once you are discharged, as it is imperative that you return to your primary care physician (or establish a relationship with a primary care physician if you do not have one) for your aftercare needs so that they can reassess your need for medications and monitor your lab values.    On the day of Discharge:  VITAL SIGNS:  Blood pressure (!) 144/75, pulse 70, temperature 98.2 F (36.8 C), temperature source Oral, resp. rate 12, height '5\' 9"'  (1.753 m), weight 89.1 kg, SpO2 94 %. PHYSICAL EXAMINATION:  GENERAL:  69 y.o.-year-old patient lying in the bed with no acute distress.  EYES: Pupils equal,  round, reactive to light and accommodation. No scleral icterus. Extraocular muscles intact.  HEENT: Head atraumatic, normocephalic. Oropharynx and nasopharynx clear.  NECK:  Supple, no jugular venous distention. No thyroid enlargement, no tenderness.  LUNGS: Normal breath sounds bilaterally, no wheezing, rales,rhonchi or crepitation. No use of accessory muscles of respiration.  CARDIOVASCULAR: S1, S2 normal. No murmurs, rubs, or gallops.  ABDOMEN: Soft, non-tender, non-distended. Bowel sounds present. No organomegaly or mass.  EXTREMITIES: No pedal edema, cyanosis, or clubbing.  NEUROLOGIC: Cranial nerves II through XII are intact. Muscle strength 5/5 in all extremities. Sensation intact. Gait not checked.  PSYCHIATRIC: The patient is alert and oriented x 3.  SKIN: No obvious rash, lesion, or ulcer.  DATA REVIEW:   CBC Recent Labs  Lab 11/10/19 0734  WBC 11.9*  HGB 15.9  HCT 44.7  PLT 177    Chemistries  Recent Labs  Lab 11/12/19 1214  NA 135  K 4.5  CL 101  CO2 26  GLUCOSE 213*  BUN 27*  CREATININE 1.29*  CALCIUM 9.1     Outpatient follow-up  Follow-up Information    [name], MD. Schedule an appointment as soon as possible for a visit in 1 week.   Specialty: Internal Medicine Why: Appointment with Melene Muller scheduled for Fri Sept 3 at [facility] information: [place] [phone]        Rise Mu, PA-C. Schedule an appointment as soon as possible for a visit in 10 days.   Specialties: Physician Assistant, Cardiology, Radiology Why: Appointment scheduled with Danna Hefty for [place]. Sept 3 at 915 Contact information: [place] [address] [phone]                Management plans discussed with the patient, family and they are in agreement.  CODE STATUS: Prior   TOTAL TIME TAKING CARE OF THIS PATIENT: 45 minutes.    Max Sane M.D on 11/12/2019  at 9:37 PM  [facility]   CC: Primary care physician; [name], MD   Note: This dictation was prepared with Dragon dictation along with smaller phrase technology. Any transcriptional errors that result from this process are unintentional.

## 2019-11-12 NOTE — Progress Notes (Addendum)
Office Visit    Patient Name: Alexander Cobb Date of Encounter: 11/12/2019  Primary Care Provider:  [name], MD Primary Cardiologist:  Ida Rogue, MD  Chief Complaint    69 y/o male with a history of coronary artery disease (s/p Non-STEMI, 2012; s/p STEMI 11/09/2019), hypertension, hyperlipidemia, mixed-ischemic/non-ischemic cardiomyopathy, HFrEF, PSVT, diabetes, tobacco use, OSA (c/CPAP), and staph aureus bacteremia, who presents for follow-up post hospital discharge following an acute inferior STEMI and PCI to the RPDA.   Past Medical History    Past Medical History:  Diagnosis Date   CAD (coronary artery disease)    a. 12/2010 Cath: nonobs dzs; b. STEMI/PCI: LM nl, LAD nl, LCX 75p/m, RCA 30p, 63md, RPDA 99 (2.25x30 Resolute Onyx DES).   Diabetes mellitus without complication ([place]    HFrEF (heart failure with reduced ejection fraction) ([place]    a. 07/2010 LV gram: EF 33%; b. 05/2018 Echo: EF 30-35%; c. 05/2018 TEE: EF 35-40%; d. 11/2019 Echo: EF 40-45%, gr1 DD. Antlat and inf HK. Nl RV fxn.   HTN (hypertension) 12/21/2010   Hyperlipidemia    Mixed Ischemic and non-ischemic cardiomyopathy    a. 07/2010 LV gram: EF 33%; b. 05/2018 Echo: EF 30-35%; c. 05/2018 TEE: EF 35-40%; d. 11/2019 Echo: EF 40-45%.   OSA (obstructive sleep apnea)    Uses CPAP at home   PSVT (paroxysmal supraventricular tachycardia) ([place] 12/21/2010   Tobacco abuse    Past Surgical History:  Procedure Laterality Date   CARDIAC CATHETERIZATION     COLONOSCOPY WITH PROPOFOL N/A 11/07/2015   Procedure: COLONOSCOPY WITH PROPOFOL;  Surgeon: MLollie Sails MD;  [facility];  Service: Endoscopy;  Laterality: N/A;   CORONARY/GRAFT ACUTE MI REVASCULARIZATION N/A 11/09/2019   Procedure: Coronary/Graft Acute MI Revascularization;  Surgeon: CYolonda Kida MD;  [facility];  Service: Cardiovascular;  Laterality: N/A;   LEFT HEART CATH AND CORONARY ANGIOGRAPHY  N/A 11/09/2019   Procedure: LEFT HEART CATH AND CORONARY ANGIOGRAPHY;  Surgeon: CYolonda Kida MD;  [facility];  Service: Cardiovascular;  Laterality: N/A;   TEE WITHOUT CARDIOVERSION N/A 05/18/2018   Procedure: TRANSESOPHAGEAL ECHOCARDIOGRAM (TEE);  Surgeon: AWellington Hampshire MD;  Location: [facility] ORS;  Service: Cardiovascular;  Laterality: N/A;    Allergies  Allergies  Allergen Reactions   Sulfa Drugs Cross Reactors Hives    Hives     History of Present Illness    69year-old-male with the above past medical history including coronary artery disease, hypertension, hyperlipidemia, mixed ischemic/non-ischemic cardiomyopathy, HFrEF, PSVT, diabetes, tobacco use, OSA, and staph aureus bacteremia. Cardiac history dates back to May of 2012, following a Non-STEMI. He underwent a left heart catheterization, which revealed 75% PDA disease and mild proximal RCA disease for which he was treated medically. His EF at the time was estimated at 33% per angiography. Follow-up echocardiogram in October 2012 showed and EF of 40-45%.  In 11/2014, he wore an event monitor in the setting of c/p and palpitations (refused stress testing).  This revealed runs of SVT, but was otherwise unremarkable. His symptoms resolved without intervention. In March of 2020 he was admitted w/ respiratory failure, CHF, and MSSA bacteremia requiring CPR and intubation.  TEE was negative for endocarditis and showed an EF of 35-40%.  He was treated with IV antibiotics.   From a cardiac standpoint, he had been stable dating back to 08/2018 on med Rx for CHF ( blocker, entresto), and was doing well when  he saw Dr. [name] in clinic in 07/2019.  Unfortunately, he presented to the [facility] ED early on 8/31 w/ chest pain and was found to have inferior ST elevation consistent w/ STEMI.  He underwent emergent diagnostic catheterization revealing severe, 99% RPDA disease, which was successfully treated with drug-eluting stent.  He  did have residual, moderate proximal and mid circumflex disease (75%).  Post PCI, echocardiograms performed and showed an EF of 40-45% with grade 1 diastolic dysfunction.  He was maintained on beta-blocker and Entresto therapy.  Spironolactone was added while his Lipitor dose was increased to 80 mg daily.  He was just discharged 2 days ago and since then, he has been feeling "great." His groin site is sore post-cath, but otherwise, he has no complaints. He has not been exercising and he continues to smoke 1/4 ppd (was smoking 1-2 ppd), though he states he plans to quit. He does experience chronic, mild dyspnea on exertion.  He is very eager to get back to work (works with [employer]) and plans to go to the beach to relax for the next few days.  He denies chest pain, palpitations, PND, orthopnea, dizziness, syncope, edema, or early satiety.  Home Medications    Prior to Admission medications   Medication Sig Start Date End Date Taking? Authorizing Provider  aspirin 81 MG tablet Take 81 mg by mouth daily. Reported on 06/30/2015    [provider]  atorvastatin (LIPITOR) 80 MG tablet Take 1 tablet (80 mg total) by mouth daily. 11/11/19   Max Sane, MD  blood glucose meter kit and supplies KIT Dispense based on patient and insurance preference. Use up to four times daily as directed. (FOR ICD-9 250.00, 250.01). 05/20/18   Gouru, Aruna, MD  Insulin Glargine (LANTUS SOLOSTAR) 100 UNIT/ML Solostar Pen Inject 15 Units into the skin daily. 05/20/18   Nicholes Mango, MD  Insulin Pen Needle 32G X 4 MM MISC 15 Units by Does not apply route daily at 10 pm. 05/20/18   Nicholes Mango, MD  metFORMIN (GLUCOPHAGE) 500 MG tablet Take 1 tablet (500 mg total) by mouth 2 (two) times daily with a meal. 05/20/18   Gouru, Aruna, MD  metoprolol succinate (TOPROL-XL) 50 MG 24 hr tablet Take 50 mg by mouth daily. Take with or immediately following a meal.    [provider]  Multiple Vitamin (MULTIVITAMIN) tablet  Take 1 tablet by mouth daily.    [provider]  nitroGLYCERIN (NITROSTAT) 0.4 MG SL tablet Place 1 tablet (0.4 mg total) under the tongue every 5 (five) minutes as needed for chest pain. 04/15/19 08/16/19  Alisa Graff, FNP  spironolactone (ALDACTONE) 25 MG tablet Take 0.5 tablets (12.5 mg total) by mouth daily. 11/11/19   Max Sane, MD  ticagrelor (BRILINTA) 90 MG TABS tablet Take 1 tablet (90 mg total) by mouth 2 (two) times daily. 11/10/19   Max Sane, MD    Review of Systems    Some degree of chronic dyspnea on exertion but since discharge, he denies chest pain, palpitations, PND, orthopnea, dizziness, syncope, edema, or early satiety.  All other systems reviewed and are otherwise negative except as noted above.  Physical Exam    Today's Vitals   11/12/19 1055  BP: (!) 158/80  Pulse: 78  SpO2: 97%  Weight: 188 lb 2 oz (85.3 kg)  Height: '5\' 9"'  (1.753 m)   Body mass index is 27.78 kg/m.  GEN: Well nourished, well developed, in no acute distress. HEENT: normal. Neck:  Supple, no JVD, carotid bruits, or masses. Cardiac: RRR, no murmurs, rubs, or gallops. No clubbing, cyanosis, edema. PT 1+ and equal bilaterally.  Right groin catheterization site without bleeding, bruit, or hematoma. Respiratory:  Respirations regular and unlabored, lungs clear, diminished throughout. GI: Soft, nontender, nondistended, BS + x 4. MS: no deformity or atrophy. Skin: warm and dry, no rash. Neuro:  Strength and sensation are intact. Psych: Normal affect.  Accessory Clinical Findings    ECG personally reviewed by me today -Normal sinus rhythm, 78, inferolateral T abnormalities- no acute changes.  Lab Results  Component Value Date   WBC 11.9 (H) 11/10/2019   HGB 15.9 11/10/2019   HCT 44.7 11/10/2019   MCV 87.1 11/10/2019   PLT 177 11/10/2019   Lab Results  Component Value Date   CREATININE 1.29 (H) 11/12/2019   BUN 27 (H) 11/12/2019   NA 135 11/12/2019   K 4.5 11/12/2019   CL 101  11/12/2019   CO2 26 11/12/2019   Lab Results  Component Value Date   ALT 16 05/16/2018   AST 21 05/16/2018   ALKPHOS 64 05/16/2018   BILITOT 0.9 05/16/2018   Lab Results  Component Value Date   CHOL 138 11/10/2019   HDL 41 11/10/2019   LDLCALC 76 11/10/2019   TRIG 105 11/10/2019   CHOLHDL 3.4 11/10/2019    Lab Results  Component Value Date   HGBA1C 7.6 (H) 11/09/2019    Assessment & Plan    1.  Inferior STEMI, subsequent episode of care/coronary artery disease: Status post hospitalization over the weekend with inferior STEMI and finding of severe RPDA disease status post drug-eluting stent placement.  Residual circumflex disease noted and currently being medically managed.  He has not had any chest pain since discharge.  He has had mild right groin tenderness though this is stable on examination today.  He remains on aspirin, high potency statin, beta-blocker, and Brilinta therapy.  In the setting of elevated blood pressure, I am going to switch his metoprolol to carvedilol.  He is very eager to get back to work and I will in filling out FMLA paperwork for him today.  I recommended that he remain out of work for a minimum of two but preferably 4 weeks.  Given residual coronary disease, we will need to consider ischemic testing to determine the ischemic significance of 75% circumflex stenoses.  We can discuss further at next visit.  2. Mixed ischemic and non-ischemic cardiomyopathy/HFrEF: Prior history of nonischemic cardiomyopathy with EF previously as low as 30 to 35%. As above, status post inferior STEMI. Left ventriculography revealed an EF of 25 to 30% however, follow-up echo showed an EF was 40 to 45%. He is euvolemic on examination today. He remains on beta-blocker and Entresto therapy and was placed on spironolactone therapy during hospitalization. Though this was prescribed as 12.5 mg daily, he has been taking 25 mg daily. I recommended follow-up labs next week however he will be  out of town, at [employer]. In that setting, I obtained a basic metabolic panel today which showed stable renal function and potassium. As above, in the setting of hypertension, and switching metoprolol to carvedilol. We can consider SGLT2 inhibitor in the future.  We discussed the importance of daily weights, sodium restriction, medication compliance, and symptom reporting and he verbalizes understanding.   3: Essential hypertension: BP elevated in office today and he says this is pretty typical for at home blood pressures as well. As above, switch metoprolol to  carvedilol. Continue current doses of Entresto. As he is already been taking spironolactone 25 mg daily and as labs are stable, I will continue this dose.  4: Hyperlipidemia: Lipid profile of 11/10/2019 revealed a total cholesterol of 138, LDL of 76 and HDL of 41. Previously on Lipitor 40 as an outpatient and dose was escalated in the setting of recent STEMI. Plan to follow-up lipids and LFTs in approximately 6 weeks. Can arrange next follow-up appointment.  5: Diabetes: History of poorly controlled diabetes. A1c 7.6 on 11/09/2019. Currently managed on Metformin 500 mg bid and Lantus 15 units daily. Follow up with PCP for ongoing diabetes management. Will consider addition of an SGLT2 inhibitor in the setting of chronic heart failure.  6: Tobacco abuse: Long history of tobacco abuse. He has cut back since his MI but is still smoking a few cigarettes a day. Complete cessation advised.  7: Disposition: Repeat basic metabolic panel today, and again in two weeks in the setting of spironolactone therapy. He would like to go back to work as soon as possible. We discussed the importance of recovery in cardiac rehabilitation today. Will complete FMLA paperwork in office today, he will pick it up this afternoon. Recommend he not return to work for a minimum of 2 weeks and peripherally four weeks. Follow-up in clinic in 1 month. Consider outpatient stress  testing to reevaluate ischemic burden of circumflex disease. Follow-up lipids in approximately 6 weeks.   Murray Hodgkins, NP 11/12/2019, 3:48 PM

## 2019-11-12 NOTE — Patient Instructions (Signed)
Medication Instructions:  1- INCREASE Spironolactone Take 1 tablet (25 mg total) by mouth daily 2- STOP Metoprolol 3- START Coreg Take 1 tablet (6.25 mg total) by mouth 2 (two) times daily  *If you need a refill on your cardiac medications before your next appointment, please call your pharmacy*   Lab Work: 1- Your physician recommends that you return for lab work TODAY [facility]. ([facility]) No appt is needed. Hours are M-F 7AM- 6 PM.  2- Your physician recommends that you return for lab work in: 2 weeks [facility] the [facility]. ([facility]) No appt is needed. Hours are M-F 7AM- 6 PM.  If you have labs (blood work) drawn today and your tests are completely normal, you will receive your results only by: Marland Kitchen MyChart Message (if you have MyChart) OR . A paper copy in the mail If you have any lab test that is abnormal or we need to change your treatment, we will call you to review the results.   Testing/Procedures: none ordered   Follow-Up: At [facility], you and your health needs are our priority.  As part of our continuing mission to provide you with exceptional heart care, we have created designated Provider Care Teams.  These Care Teams include your primary Cardiologist (physician) and Advanced Practice Providers (APPs -  Physician Assistants and Nurse Practitioners) who all work together to provide you with the care you need, when you need it.  We recommend signing up for the patient portal called "MyChart".  Sign up information is provided on this After Visit Summary.  MyChart is used to connect with patients for Virtual Visits (Telemedicine).  Patients are able to view lab/test results, encounter notes, upcoming appointments, etc.  Non-urgent messages can be sent to your provider as well.   To learn more about what you can do with MyChart, go to [email or web address].    Your next appointment:   1 month(s)  The format for your next appointment:   In Person  Provider:    You  may see Ida Rogue, MD or Murray Hodgkins, NP

## 2019-11-12 NOTE — Telephone Encounter (Signed)
Patient contacted regarding discharge from [facility] on 11/10/19.  Patient understands to follow up with provider Sharolyn Douglas, NP on 11/12/19 at 10:55 am at [facility] Patient understands discharge instructions? yes Patient understands medications and regiment? yes Patient understands to bring all medications to this visit? yes  Ask patient:  Are you enrolled in My Chart (no, not at this time).

## 2019-11-16 ENCOUNTER — Other Ambulatory Visit: Payer: Self-pay | Admitting: *Deleted

## 2019-11-16 ENCOUNTER — Ambulatory Visit: Payer: Medicare HMO | Admitting: Family

## 2019-11-17 ENCOUNTER — Telehealth: Payer: Self-pay | Admitting: Cardiovascular Disease

## 2019-11-17 NOTE — Telephone Encounter (Signed)
I spoke with the patient. He had a cath on 11/09/19 with a groin stick by Dr. Clayborn Bigness for STEMI.  Per his report, he has had a knot about the size of a quarter at his groin site. This has not gotten any larger over the last week. He did notice some reddness and heat to the area yesterday. This morning, he noticed some pink tinged drainage coming from the cath site. The area is warm to touch. He states he applied ointment to the site and a bandaid.  I advised the patient that it is not recommended to put any ointment on the site to decrease his risk of infection. I also advised that we should see him to address his cath site. The patient states he cannot come in as [place] the beach until [place].   I advised I would review the above with Dr. Saunders Revel (DOD) and call him back.  I was able to speak with Dr. Saunders Revel and advised him of the above information. Per Dr. Saunders Revel, the patient should not have gone to the beach yet, but since he is there, he should have someone look at his groin site, which will most likely be Urgent Care.   I have notified the patient that he should have someone look at his groin site, ideally it would be best viewed by our providers as they are familiar with cath sites, but since he is out of town, he will need to seek evaluation at an Urgent Care. Per the patient "I will do that if it gets worse." I advised the patient that he should be seen now to insure he has no infection at his site as he should not be having drainage from there a week post procedure.  The patient voiced understanding, but did not commit to evaluation at an Urgent Care.

## 2019-11-17 NOTE — Telephone Encounter (Signed)
Patient c/o pain at cath site    Had 8/31 cath .  Soreness overall  But decreased when it  just started oozing today a pink tinge fluid .    Patient has noticeable hard knot about the size of a quarter at site.    It is red in color and a little warm today previously day it was hot . Per patient no streaking around site .

## 2019-11-22 ENCOUNTER — Other Ambulatory Visit
Admission: RE | Admit: 2019-11-22 | Discharge: 2019-11-22 | Disposition: A | Discharge status: Home / Self Care | Payer: Medicare HMO | Attending: Nurse Practitioner | Admitting: Nurse Practitioner

## 2019-11-22 ENCOUNTER — Other Ambulatory Visit: Payer: Self-pay

## 2019-11-22 DIAGNOSIS — I5042 Chronic combined systolic (congestive) and diastolic (congestive) heart failure: Secondary | ICD-10-CM

## 2019-11-22 LAB — BASIC METABOLIC PANEL
Anion gap: 8 (ref 5–15)
BUN: 16 mg/dL (ref 8–23)
CO2: 23 mmol/L (ref 22–32)
Calcium: 8.7 mg/dL — ABNORMAL LOW (ref 8.9–10.3)
Chloride: 104 mmol/L (ref 98–111)
Creatinine, Ser: 1.1 mg/dL (ref 0.61–1.24)
GFR calc Af Amer: >60 mL/min (ref >60)
GFR calc non Af Amer: >60 mL/min (ref >60)
Glucose, Bld: 112 mg/dL — ABNORMAL HIGH (ref 70–99)
Potassium: 4.4 mmol/L (ref 3.5–5.1)
Sodium: 135 mmol/L (ref 135–145)

## 2019-11-22 NOTE — Telephone Encounter (Signed)
Calling in stating while at beach patient had issues with draining, went to urgent care where he was not able to be helped. Patient then went to [facility] for drainage issues. Ultrasound was done to rule out aneurism and then put him on antibiotic. Patient swelling in the groin area has reduced but is still draining. Patient is back in town now Patient scheduled for 9/14 at 10 am pending nurses advice on keeping the appointment  Please advise

## 2019-11-22 NOTE — Telephone Encounter (Signed)
Spoke to patient and girlfriend. Patient went to the ER at the beach last week.  ER at the beach prescribed Keflex.  The day after he started taking the antibiotic, the redness, warmth started to go away. Now the drainage is clear with a tinge of red.  He is aware of appointment tomorrow.

## 2019-11-23 ENCOUNTER — Ambulatory Visit (INDEPENDENT_AMBULATORY_CARE_PROVIDER_SITE_OTHER): Payer: Medicare HMO | Admitting: Physician Assistant

## 2019-11-23 ENCOUNTER — Encounter: Payer: Self-pay | Admitting: Physician Assistant

## 2019-11-23 ENCOUNTER — Telehealth: Payer: Self-pay

## 2019-11-23 VITALS — BP 140/86 | HR 72 | Ht 69.0 in | Wt 187.4 lb

## 2019-11-23 DIAGNOSIS — I213 ST elevation (STEMI) myocardial infarction of unspecified site: Secondary | ICD-10-CM | POA: Diagnosis not present

## 2019-11-23 DIAGNOSIS — I10 Essential (primary) hypertension: Secondary | ICD-10-CM

## 2019-11-23 DIAGNOSIS — I5042 Chronic combined systolic (congestive) and diastolic (congestive) heart failure: Secondary | ICD-10-CM

## 2019-11-23 DIAGNOSIS — I255 Ischemic cardiomyopathy: Secondary | ICD-10-CM | POA: Diagnosis not present

## 2019-11-23 DIAGNOSIS — E119 Type 2 diabetes mellitus without complications: Secondary | ICD-10-CM

## 2019-11-23 DIAGNOSIS — E785 Hyperlipidemia, unspecified: Secondary | ICD-10-CM

## 2019-11-23 DIAGNOSIS — I2510 Atherosclerotic heart disease of native coronary artery without angina pectoris: Secondary | ICD-10-CM

## 2019-11-23 DIAGNOSIS — R0602 Shortness of breath: Secondary | ICD-10-CM

## 2019-11-23 DIAGNOSIS — Z794 Long term (current) use of insulin: Secondary | ICD-10-CM

## 2019-11-23 DIAGNOSIS — Z720 Tobacco use: Secondary | ICD-10-CM

## 2019-11-23 MED ORDER — NICOTINE 21 MG/24HR TD PT24: 21.0000 mg | MEDICATED_PATCH | Freq: Every day | TRANSDERMAL | 0 refills | Status: DC

## 2019-11-23 NOTE — Progress Notes (Signed)
Office Visit    Patient Name: Alexander Cobb Date of Encounter: 11/23/2019  Primary Care Provider:  [name], MD Primary Cardiologist:  Ida Rogue, MD  Chief Complaint    Chief Complaint  Patient presents with  . office visit    F/U after ED visit in [facility] antibiotic to treat bloody drainage from cath site; Meds verbally     69 year old male with history of CAD (s/p non-STEMI, 2012; s/p STEMI 11/09/2019), hypertension, hyperlipidemia, mixed ischemic/nonischemic cardiomyopathy, HFrEF, P SVT, diabetes, tobacco use, OSA on CPAP, and staph aureus bacteremia, and who presents for follow-up s/p hospital discharge following acute inferior STEMI and PCI to the RPDA with subsequent beach trip and right femoral arteriotomy site hematoma and infection on Keflex regimen.  Past Medical History    Past Medical History:  Diagnosis Date  . CAD (coronary artery disease)    a. 12/2010 Cath: nonobs dzs; b. STEMI/PCI: LM nl, LAD nl, LCX 75p/m, RCA 30p, 65md, RPDA 99 (2.25x30 Resolute Onyx DES).  . Diabetes mellitus without complication ([place]   . HFrEF (heart failure with reduced ejection fraction) ([place]    a. 07/2010 LV gram: EF 33%; b. 05/2018 Echo: EF 30-35%; c. 05/2018 TEE: EF 35-40%; d. 11/2019 Echo: EF 40-45%, gr1 DD. Antlat and inf HK. Nl RV fxn.  .Marland KitchenHTN (hypertension) 12/21/2010  . Hyperlipidemia   . Mixed Ischemic and non-ischemic cardiomyopathy    a. 07/2010 LV gram: EF 33%; b. 05/2018 Echo: EF 30-35%; c. 05/2018 TEE: EF 35-40%; d. 11/2019 Echo: EF 40-45%.  . OSA (obstructive sleep apnea)    Uses CPAP at home  . PSVT (paroxysmal [place]ricular tachycardia) ([place] 12/21/2010  . Tobacco abuse    Past Surgical History:  Procedure Laterality Date  . CARDIAC CATHETERIZATION    . COLONOSCOPY WITH PROPOFOL N/A 11/07/2015   Procedure: COLONOSCOPY WITH PROPOFOL;  Surgeon: MLollie Sails MD;  [facility];  Service: Endoscopy;  Laterality: N/A;  .  CORONARY/GRAFT ACUTE MI REVASCULARIZATION N/A 11/09/2019   Procedure: Coronary/Graft Acute MI Revascularization;  Surgeon: CYolonda Kida MD;  [facility];  Service: Cardiovascular;  Laterality: N/A;  . LEFT HEART CATH AND CORONARY ANGIOGRAPHY N/A 11/09/2019   Procedure: LEFT HEART CATH AND CORONARY ANGIOGRAPHY;  Surgeon: CYolonda Kida MD;  [facility];  Service: Cardiovascular;  Laterality: N/A;  . TEE WITHOUT CARDIOVERSION N/A 05/18/2018   Procedure: TRANSESOPHAGEAL ECHOCARDIOGRAM (TEE);  Surgeon: AWellington Hampshire MD;  Location: [facility] ORS;  Service: Cardiovascular;  Laterality: N/A;    Allergies  Allergies  Allergen Reactions  . Sulfa Drugs Cross Reactors Hives    Hives     History of Present Illness    Alexander FITZHENRYis a 69y.o. male with PMH as above.  His cardiac history dates back to 07/2010, following a non-STEMI.  He underwent LHC, revealing 75 PDA disease and mild proximal RCA disease for which she was treated medically.  His EF at that time was estimated at 33% per angiography.  Follow-up echo 12/2010 showed EF 40 to 45%.  In 11/2014, he wore an event monitor in the setting of chest pain and palpitations as he declined stress testing.  Monitoring revealed runs of SVT but otherwise unremarkable.  His symptoms resolved without intervention.  In 05/2018, he was admitted with respiratory failure, CHF, and MSSA bacteremia requiring CPR and intubation.  TEE was negative for endocarditis and showed EF of 35 to 40%.  He  was treated with IV antibiotics. It was noted that, from cardiac standpoint, he had been stable dating back to 08/2018 on medical management for CHF (beta-blocker, Entresto), and was doing well when he saw his primary cardiologist, Dr. [name], in clinic 07/2019.    On 8/31, he presented to [facility] ED with chest pain and was found to have inferior ST elevation consistent with STEMI.  He underwent emergent diagnostic catheterization revealing  severe 99% RPDA disease, successfully treated with DES.  He had residual, moderate proximal and mid circumflex disease (75%).  Post PCI, echo showed EF 40 to 45% with grade 1 diastolic dysfunction.  He was maintained on beta-blocker and Entresto therapy.  Spironolactone was added and Lipitor increased to 80 mg daily.  After discharge, he was seen in clinic 11/12/2019 by Murray Hodgkins, NP.  He reported feeling "great."His groin site was reportedly sore post cath, but otherwise, he had no complaints.  He had not been exercising and continued to smoke 1/4 pack/day (previously smoking 1 to 2 packs/day), though he stated a desire to quit.  He experienced chronic mild dyspnea on exertion.  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  He also plan to go to the beach to relax for the next few days at that time.  EKG showed NSR, inferior lateral T wave abnormalities.  FMLA paperwork was completed with recommendation to remain out of work for 2, preferably 4 weeks.  Given his residual CAD, it was noted he would need to consider ischemic testing to just determine the ischemic significance of 75% circumflex stenoses.  In the setting of hypertension, metoprolol was switched to carvedilol.  Follow-up lab work was obtained for spironolactone, as he was taking 25 mg daily.  It was noted SGLT2 inhibitor could be considered in the future.  The importance of cardiac rehab was discussed.  Follow-up lipids were planned for his next follow-up in approximately October.  Today, 11/23/2019, he returns to clinic and notes a recent [facility] ED visit 2/2 infection at his right femoral arteriotomy site.  He reports that he did not have any significant pain but did notice pus and bloody discharge, as well as a harder area surrounding the arteriotomy site.  In [place] beach, he was started on Keflex 500 mg 3 times daily x10 days.  He does not have associated paperwork with this ED visit but notes that he  has several pills left.  He is uncertain how many days he has been on this regimen.  Since starting his antibiotic, he does note improvement in the arteriotomy site discharge, as it now mainly drains clear fluid and only when you press on it.  He reports a stable size hematoma surrounding the site.  He denies any pain during today's visit.  He has not noticed any recent erythema.  He denies any warmth, fevers, chills.  No presyncope or syncope.  Recommendations as below.  He also denies any chest pain, racing heart rate, palpitations.  He denies any lower extremity edema, abdominal distention, orthopnea, PND, or early satiety.  He reports he is overall doing well.  No signs or symptoms of bleeding.  He reports medication compliance.  He indicates a strong desire to return to work as early as Monday of next week.  We reviewed the recommendations put forth by Murray Hodgkins, NP at his previous visit.  Home Medications    Prior to Admission medications   Medication Sig Start Date  End Date Taking? Authorizing Provider  aspirin 81 MG tablet Take 81 mg by mouth daily. Reported on 06/30/2015    [provider]  atorvastatin (LIPITOR) 80 MG tablet Take 1 tablet (80 mg total) by mouth daily. 11/12/19   Theora Gianotti, NP  blood glucose meter kit and supplies KIT Dispense based on patient and insurance preference. Use up to four times daily as directed. (FOR ICD-9 250.00, 250.01). 05/20/18   Gouru, Illene Silver, MD  carvedilol (COREG) 6.25 MG tablet Take 1 tablet (6.25 mg total) by mouth 2 (two) times daily. 11/12/19   Theora Gianotti, NP  Insulin Glargine (LANTUS SOLOSTAR) 100 UNIT/ML Solostar Pen Inject 15 Units into the skin daily. 05/20/18   Nicholes Mango, MD  Insulin Pen Needle 32G X 4 MM MISC 15 Units by Does not apply route daily at 10 pm. 05/20/18   Nicholes Mango, MD  metFORMIN (GLUCOPHAGE) 500 MG tablet Take 1 tablet (500 mg total) by mouth 2 (two) times daily with a meal. 05/20/18   Gouru,  Aruna, MD  methimazole (TAPAZOLE) 5 MG tablet Take 2.5 mg by mouth daily. 08/26/19   [provider]  Multiple Vitamin (MULTIVITAMIN) tablet Take 1 tablet by mouth daily.    [provider]  nitroGLYCERIN (NITROSTAT) 0.4 MG SL tablet Place 1 tablet (0.4 mg total) under the tongue every 5 (five) minutes as needed for chest pain. 04/15/19 08/16/19  Alisa Graff, FNP  sacubitril-valsartan (ENTRESTO) 97-103 MG Take 1 tablet by mouth 2 (two) times daily.    [provider]  spironolactone (ALDACTONE) 25 MG tablet Take 1 tablet (25 mg total) by mouth daily. 11/12/19   Theora Gianotti, NP  ticagrelor (BRILINTA) 90 MG TABS tablet Take 1 tablet (90 mg total) by mouth 2 (two) times daily. 11/12/19   Theora Gianotti, NP    Review of Systems    He denies chest pain, palpitations, dyspnea, pnd, orthopnea, n, v, dizziness, syncope, edema, weight gain, or early satiety.  He reports a recent infection of his arteriotomy site, for which he is taking Keflex 500 mg 3 times daily.  Since starting his antibiotic, he has noted improvement in the discharge fluid from pus/bloody to clear fluid that only is evident when you press firmly on the site.  He reports stable size hematoma.  He denies any pain or erythema.  No signs or symptoms of bleeding.   All other systems reviewed and are otherwise negative except as noted above.  Physical Exam    VS:  BP 140/86 (BP Location: Left Arm, Patient Position: Sitting, Cuff Size: Normal)   Pulse 72   Ht 5' 9" (1.753 m)   Wt 187 lb 6 oz (85 kg)   SpO2 95%   BMI 27.67 kg/m  , BMI Body mass index is 27.67 kg/m. GEN: Well nourished, well developed, in no acute distress. HEENT: normal. Neck: Supple, no JVD, carotid bruits, or masses. Cardiac: RRR, no murmurs, rubs, or gallops. No clubbing, cyanosis.  No pitting edema.  Radials/DP/PT 2+ and equal bilaterally.  Right groin, femoral catheterization arteriotomy site with small noted hematoma  and clear fluid that escapes from site  with gentle pressure applied on skin.  No evidence of bleeding, bruit, erythema, warmth Respiratory:  Respirations regular and unlabored, diminished breath sounds throughout his lung fields. GI: Soft, nontender, nondistended, BS + x 4. MS: no deformity or atrophy. Skin: warm and dry, no rash. Neuro:  Strength and sensation are intact. Psych: Normal affect.  Accessory Clinical Findings    ECG personally reviewed by me today -EKG deferred today- no acute changes.  VITALS Reviewed today   Temp Readings from Last 3 Encounters:  11/10/19 98.2 F (36.8 C) (Oral)  06/02/18 (!) 97.5 F (36.4 C) (Oral)  05/28/18 (!) 97.4 F (36.3 C)   BP Readings from Last 3 Encounters:  11/23/19 140/86  11/12/19 (!) 158/80  11/10/19 (!) 144/75   Pulse Readings from Last 3 Encounters:  11/23/19 72  11/12/19 78  11/10/19 70    Wt Readings from Last 3 Encounters:  11/23/19 187 lb 6 oz (85 kg)  11/12/19 188 lb 2 oz (85.3 kg)  11/09/19 196 lb 6.9 oz (89.1 kg)     LABS  reviewed today   Lab Results  Component Value Date   WBC 11.9 (H) 11/10/2019   HGB 15.9 11/10/2019   HCT 44.7 11/10/2019   MCV 87.1 11/10/2019   PLT 177 11/10/2019   Lab Results  Component Value Date   CREATININE 1.10 11/22/2019   BUN 16 11/22/2019   NA 135 11/22/2019   K 4.4 11/22/2019   CL 104 11/22/2019   CO2 23 11/22/2019   Lab Results  Component Value Date   ALT 16 05/16/2018   AST 21 05/16/2018   ALKPHOS 64 05/16/2018   BILITOT 0.9 05/16/2018   Lab Results  Component Value Date   CHOL 138 11/10/2019   HDL 41 11/10/2019   LDLCALC 76 11/10/2019   TRIG 105 11/10/2019   CHOLHDL 3.4 11/10/2019    Lab Results  Component Value Date   HGBA1C 7.6 (H) 11/09/2019   Lab Results  Component Value Date   TSH 0.012 (L) 05/16/2018     STUDIES/PROCEDURES reviewed today   Echo 11/10/2019 1. Left ventricular ejection fraction, by estimation, is 40 to 45%. The  left  ventricle has mild to moderately decreased function. The left  ventricle demonstrates regional wall motion abnormalities (see scoring  diagram/findings for description). Left  ventricular diastolic parameters are consistent with Grade I diastolic  dysfunction (impaired relaxation). There is hypokinesis of the left  ventricular, entire anterolateral wall and inferior wall.  2. Right ventricular systolic function is normal. The right ventricular  size is normal.  3. The mitral valve is grossly normal. No evidence of mitral valve  regurgitation.  4. The aortic valve was not well visualized. Aortic valve regurgitation  is not visualized.  5. Pulmonic valve regurgitation not assessed.  6. The inferior vena cava is normal in size with greater than 50%  respiratory variability, suggesting right atrial pressure of 3 mmHg.   LHC 11/09/19  Prox Cx lesion is 75% stenosed.  Mid Cx lesion is 75% stenosed.  RPDA lesion is 99% stenosed.  Prox RCA lesion is 30% stenosed.  Mid RCA lesion is 30% stenosed.  A drug-eluting stent was successfully placed using a STENT RESOLUTE ONYX 2.25X30.  Post intervention, there is a 0% residual stenosis.  Severely depressed overall left ventricular function around 25 to 30% with inferior akinesis left ventricular enlargement Conclusion Code STEMI presentation from the emergency room Patient presented with inferior ST elevation After evaluation emergency room patient was brought to the cardiac Cath Lab for diagnostic cardiac cath possible PCI and stent LV function is globally depressed at around 25 to 30% with inferior akinesis Coronaries Long left main relatively free of disease LAD is moderate in size relatively free of disease Circumflex is moderate in size with OM1 with tandem 75% lesions RCA was a  large vessel with anterior takeoff with mild to moderate disease proximal to mid but the PDA was diffusely diseased focally of around 99% long lesion  TIMI-3 flow Successful PCI and stent of PDA with a 2.25 x 30 mm resolute Onyx reducing the lesion from 99 down to 0% Patient was maintained on Brilinta and aspirin Patient received Angiomax during the procedure for anticoagulation Right femoral artery was minx   Assessment & Plan    Inferior STEMI -S/p hospitalization for inferior STEMI and finding of severe RPDA disease s/p DES.  Residual left circumflex disease noted and medically managed.  Denies any symptoms of chest pain or dyspnea since discharge.  He went to the beach and subsequently was seen in the [facility] ED 2/2 right groin arteriotomy site infection.  He has started on a regimen of Keflex 500 mg 3 times daily x10 days.  He reports improvement in discharge since that time, as the fluid is now clear and only escapes with mild pressure on arteriotomy site.  He has a small hematoma and will continue to monitor this in size.  He denies any pain associated with this infection.  No fevers or chills.  We reviewed the signs and symptoms of arteriotomy site infection or concerning signs for which she should call the office, also provided on today's AVS.  He plans to complete his current antibiotic regimen and will let us know if he still continues to have symptoms at that time.  Discussed case with DOD as well.  Continue ASA, Brilinta, beta-blocker, and high intensity statin.  His metoprolol was changed to carvedilol with BP 140/86 and recommendation to increase carvedilol at RTC with continued elevated BP and goal BP 130/80.  He is eager to go to work with case discussed with Murray Hodgkins, NP in agreement that he can return to work as long as no symptoms, which she denies today, and with agreement to obtain a nuclear study in approximately 2 weeks to further assess his residual disease.  He is agreeable to this plan.  Smoking cessation advised.  Encourage cardiac rehab with patient understanding.  Mixed ischemic and nonischemic  cardiomyopathy/HFrEF --Prior history of NICM with EF as low as 30 to 35%.  S/p inferior STEMI.  Left ventriculography revealed EF 25 to 30% with follow-up echo EF 40 to 45%.  He remains euvolemic on exam today.  Recommend increasing carvedilol at RTC if BP remains over 130/80.  He reports lower pressures at home.  Recommend continue Entresto, spironolactone. Consider SGLT2 in the future.  Continue to monitor daily weights, sodium restriction, medication compliance and symptoms.  Essential hypertension -BP elevated at 140/86 with patient noting improved pressures at home.  Metoprolol previously switched to carvedilol.  Will continue for now and reassess BP at RTC.  If elevated over 130/80 at that time, recommend increased dose.  Continue current Entresto and spironolactone 25 mg daily.  HLD --Continue high intensity statin.  Lipids and LFTs in 6 to 8 weeks from start of atorvastatin.  DM2 --Glycemic control recommended.  Continue to follow with PCP.  Consider addition of SGLT2 inhibitor in the future.  Tobacco use -Long history of tobacco use.  Long discussion regarding barriers to quit smoking and encouragement to quit smoking.  Provided #[phone] quit smoking today.  Nicotine patch is also provided today with patient indicating his desire to cut back on smoking.  Complete cessation advised.  Disposition: Follow-up in clinic as scheduled.  He is able to return to work with  agreement to complete nuclear study in the upcoming weeks to reevaluate ischemic burden of circumflex disease.  Follow-up lipids in approximately 6 weeks.  The importance of cardiac rehab was discussed today with patient not indicating that cardiac rehab has called him.  Confirmed cardiac rehab has been ordered.   Arvil Chaco, PA-C 11/23/2019

## 2019-11-23 NOTE — Patient Instructions (Addendum)
Medication Instructions:  1- START Nicotine patches, follow up with primary doctor for further orders.  *If you need a refill on your cardiac medications before your next appointment, please call your pharmacy*   Lab Work: none ordered If you have labs (blood work) drawn today and your tests are completely normal, you will receive your results only by: Marland Kitchen MyChart Message (if you have MyChart) OR . A paper copy in the mail If you have any lab test that is abnormal or we need to change your treatment, we will call you to review the results.   Testing/Procedures: none ordered   Follow-Up: At [facility], you and your health needs are our priority.  As part of our continuing mission to provide you with exceptional heart care, we have created designated Provider Care Teams.  These Care Teams include your primary Cardiologist (physician) and Advanced Practice Providers (APPs -  Physician Assistants and Nurse Practitioners) who all work together to provide you with the care you need, when you need it.  We recommend signing up for the patient portal called "MyChart".  Sign up information is provided on this After Visit Summary.  MyChart is used to connect with patients for Virtual Visits (Telemedicine).  Patients are able to view lab/test results, encounter notes, upcoming appointments, etc.  Non-urgent messages can be sent to your provider as well.   To learn more about what you can do with MyChart, go to [email or web address].    Your next appointment:  Keep appt as scheduled  Other Instructions Smoking cessation advised. Advised call 1-800-QUIT-NOW 3035095747) for help with quitting smoking in addition to patches. Keep procedure site clean & dry.  If you notice increased pain, swelling, bleeding or pus, call/return!

## 2019-11-23 NOTE — Telephone Encounter (Signed)
message reeceived from Elenor Quinones, PA. Pt needing myoview stress test. Per JV, pt okay to return to work on Monday as long as he gets the [place] when available.  Pt was made aware of POC by provider.   Order placed ans sent to scheduling.

## 2019-11-24 ENCOUNTER — Telehealth: Payer: Self-pay | Admitting: Physician Assistant

## 2019-11-24 NOTE — Telephone Encounter (Signed)
[name], do you mind looking at this and advising, or I can send to his primary cardiologist if needed.  Thanks!

## 2019-11-24 NOTE — Progress Notes (Signed)
Patient ID: Alexander Cobb, male    DOB: 03/22/1950, 69 y.o.   MRN: [id number]  HPI  Alexander Cobb is a 69 y/o male with a history of CAD, DM, hyperlipidemia, HTN, obstructive sleep apnea, current tobacco use and chronic heart failure.   Echo report from 11/10/19 reviewed and showed an EF of 40-45%. Echo report from 06/15/19 reviewed and showed an EF of 45-50% along with moderate LVH and trivial Alexander. Echo report from 05/18/2018 reviewed and showed an EF of 35-40% along with trivial Alexander.  Catheterization done 11/09/19 showed:  Prox Cx lesion is 75% stenosed.  Mid Cx lesion is 75% stenosed.  RPDA lesion is 99% stenosed.  Prox RCA lesion is 30% stenosed.  Mid RCA lesion is 30% stenosed.  A drug-eluting stent was successfully placed using a STENT RESOLUTE ONYX 2.25X30.  Post intervention, there is a 0% residual stenosis.  Severely depressed overall left ventricular function around 25 to 30% with inferior akinesis left ventricular enlargement   Conclusion LV function is globally depressed at around 25 to 30% with inferior akinesis Coronaries Long left main relatively free of disease LAD is moderate in size relatively free of disease Circumflex is moderate in size with OM1 with tandem 75% lesions RCA was a large vessel with anterior takeoff with mild to moderate disease proximal to mid but the PDA was diffusely diseased focally of around 99% long lesion TIMI-3 flow Successful PCI and stent of PDA with a 2.25 x 30 mm resolute Onyx reducing the lesion from 99 down to 0%   Admitted 11/09/19 due to chest pressure and shortness of breath. STEMI called and taken to cath lab and stent placed. Cardiology consult obtained. Discharged the next day.   Alexander Cobb presents today for a follow-up visit with a chief complaint of minimal shortness of breath upon moderate exertion. Alexander Cobb describes this as chronic in nature having been present for several years although has improved since his admission a couple of weeks ago. Alexander Cobb  has associated fatigue, cough and rhinorrhea. Alexander Cobb denies any difficulty sleeping, dizziness, abdominal distention, palpitations, pedal edema, chest pain or weight gain.   Past Medical History:  Diagnosis Date  . CAD (coronary artery disease)    a. 12/2010 Cath: nonobs dzs; b. STEMI/PCI: LM nl, LAD nl, LCX 75p/m, RCA 30p, 25md, RPDA 99 (2.25x30 Resolute Onyx DES).  . Diabetes [place] complication ([facility]   . HFrEF (heart failure with reduced ejection fraction) ([place]    a. 07/2010 LV gram: EF 33%; b. 05/2018 Echo: EF 30-35%; c. 05/2018 TEE: EF 35-40%; d. 11/2019 Echo: EF 40-45%, gr1 DD. Antlat and inf HK. Nl RV fxn.  .Marland KitchenHTN (hypertension) 12/21/2010  . Hyperlipidemia   . Mixed Ischemic and non-ischemic cardiomyopathy    a. 07/2010 LV gram: EF 33%; b. 05/2018 Echo: EF 30-35%; c. 05/2018 TEE: EF 35-40%; d. 11/2019 Echo: EF 40-45%.  . OSA (obstructive sleep apnea)    Uses CPAP at home  . PSVT (paroxysmal supraventricular tachycardia) ([facility] 12/21/2010  . Tobacco abuse    Past Surgical History:  Procedure Laterality Date  . CARDIAC CATHETERIZATION    . COLONOSCOPY WITH PROPOFOL N/A 11/07/2015   Procedure: COLONOSCOPY WITH PROPOFOL;  Surgeon: MLollie Sails MD;  [facility];  Service: Endoscopy;  Laterality: N/A;  . CORONARY/GRAFT ACUTE MI REVASCULARIZATION N/A 11/09/2019   Procedure: Coronary/Graft Acute MI Revascularization;  Surgeon: CYolonda Kida MD;  [facility];  Service: Cardiovascular;  Laterality: N/A;  . LEFT  HEART CATH AND CORONARY ANGIOGRAPHY N/A 11/09/2019   Procedure: LEFT HEART CATH AND CORONARY ANGIOGRAPHY;  Surgeon: Yolonda Kida, MD;  Location: [place] [facility];  Service: Cardiovascular;  Laterality: N/A;  . TEE WITHOUT CARDIOVERSION N/A 05/18/2018   Procedure: TRANSESOPHAGEAL ECHOCARDIOGRAM (TEE);  Surgeon: Wellington Hampshire, MD;  Location: [facility] ORS;  Service: Cardiovascular;  Laterality: N/A;   Family History  Problem Relation Age  of Onset  . COPD Father    Social History   Tobacco Use  . Smoking status: Current Every Day Smoker    Packs/day: 0.50    Years: 40.00    Pack years: 20.00    Types: Cigarettes  . Smokeless tobacco: Never Used  Substance Use Topics  . Alcohol use: Not Currently   Allergies  Allergen Reactions  . Sulfa Drugs Cross Reactors Hives    Hives    Prior to Admission medications   Medication Sig Start Date End Date Taking? Authorizing Provider  aspirin 81 MG tablet Take 81 mg by mouth daily. Reported on 06/30/2015   Yes [provider]  atorvastatin (LIPITOR) 80 MG tablet Take 1 tablet (80 mg total) by mouth daily. 11/12/19  Yes Theora Gianotti, NP  blood glucose meter kit and supplies KIT Dispense based on patient and insurance preference. Use up to four times daily as directed. (FOR ICD-9 250.00, 250.01). 05/20/18  Yes Gouru, Aruna, MD  carvedilol (COREG) 6.25 MG tablet Take 1 tablet (6.25 mg total) by mouth 2 (two) times daily. 11/12/19  Yes Theora Gianotti, NP  cephALEXin (KEFLEX) 500 MG capsule Take 500 mg by mouth 3 (three) times daily. 11/17/19  Yes [provider]  Insulin Glargine (LANTUS SOLOSTAR) 100 UNIT/ML Solostar Pen Inject 15 Units into the skin daily. 05/20/18  Yes Gouru, Illene Silver, MD  Insulin Pen Needle 32G X 4 MM MISC 15 Units by Does not apply route daily at 10 pm. 05/20/18  Yes Gouru, Illene Silver, MD  metFORMIN (GLUCOPHAGE) 500 MG tablet Take 1 tablet (500 mg total) by mouth 2 (two) times daily with a meal. 05/20/18  Yes Gouru, Aruna, MD  methimazole (TAPAZOLE) 5 MG tablet Take 2.5 mg by mouth daily. 08/26/19  Yes [provider]  Multiple Vitamin (MULTIVITAMIN) tablet Take 1 tablet by mouth daily.   Yes [provider]  nicotine (NICOTINE STEP 1) 21 mg/24hr patch Place 1 patch (21 mg total) onto the skin daily. 11/23/19  Yes Visser, [name] D, PA-C  nitroGLYCERIN (NITROSTAT) 0.4 MG SL tablet Place 1 tablet (0.4 mg total) under the  tongue every 5 (five) minutes as needed for chest pain. 04/15/19 11/22/28 Yes Albert Devaul A, FNP  sacubitril-valsartan (ENTRESTO) 97-103 MG Take 1 tablet by mouth 2 (two) times daily.   Yes [provider]  spironolactone (ALDACTONE) 25 MG tablet Take 1 tablet (25 mg total) by mouth daily. 11/12/19  Yes Theora Gianotti, NP  ticagrelor (BRILINTA) 90 MG TABS tablet Take 1 tablet (90 mg total) by mouth 2 (two) times daily. 11/12/19  Yes Theora Gianotti, NP    Review of Systems  Constitutional: Positive for fatigue. Negative for appetite change.  HENT: Positive for rhinorrhea. Negative for congestion and sore throat.   Eyes: Negative.   Respiratory: Positive for cough (productive) and shortness of breath (with moderate exertion).   Cardiovascular: Negative for chest pain, palpitations and leg swelling.  Gastrointestinal: Negative for abdominal distention and abdominal pain.  Endocrine: Negative.   Genitourinary: Negative.   Musculoskeletal: Negative for  back pain and neck pain.  Skin: Negative.   Allergic/Immunologic: Negative.   Neurological: Negative for dizziness and light-headedness.  Hematological: Negative for adenopathy. Does not bruise/bleed easily.  Psychiatric/Behavioral: Negative for dysphoric mood and sleep disturbance (CPAP with 1 pillow under head). The patient is not nervous/anxious.    Vitals:   11/25/19 1345  BP: (!) 148/73  Pulse: 69  Resp: 20  SpO2: 96%  Weight: 187 lb 6 oz (85 kg)  Height: '5\' 9"'  (1.753 m)   Wt Readings from Last 3 Encounters:  11/25/19 187 lb 6 oz (85 kg)  11/23/19 187 lb 6 oz (85 kg)  11/12/19 188 lb 2 oz (85.3 kg)   Lab Results  Component Value Date   CREATININE 1.10 11/22/2019   CREATININE 1.29 (H) 11/12/2019   CREATININE 1.46 (H) 11/10/2019    Physical Exam Vitals and nursing note reviewed.  Constitutional:      Appearance: Normal appearance.  HENT:     Head: Normocephalic and atraumatic.  Cardiovascular:      Rate and Rhythm: Normal rate and regular rhythm.  Pulmonary:     Effort: Pulmonary effort is normal. No respiratory distress.     Breath sounds: No wheezing or rales.  Abdominal:     General: There is no distension.     Palpations: Abdomen is soft.  Musculoskeletal:        General: No tenderness.     Cervical back: Normal range of motion and neck supple.     Right lower leg: No edema.     Left lower leg: No edema.  Skin:    General: Skin is dry.  Neurological:     General: No focal deficit present.     Mental Status: Alexander Cobb is alert and oriented to person, place, and time.  Psychiatric:        Mood and Affect: Mood normal.        Behavior: Behavior normal.        Thought Content: Thought content normal.    Assessment & Plan:  1: Chronic heart failure with reduced ejection fraction- - NYHA class II - euvolemic today - weighing daily; reminded to call for an overnight weight gain of >2 pounds or a weekly weight gain of >5 pounds - weight down 2 pounds from last visit 3 months ago - not adding salt and tries to eat low sodium foods; reviewed the importance of closely following a low sodium diet  - saw cardiology Mickle Plumb) 11/23/19 - scheduled for a stress test on 12/07/19  2: HTN- - BP mildly elevated but Alexander Cobb admits that Alexander Cobb's antsy because Alexander Cobb hasn't gone back to work yet; hoping to return 11/29/19 - saw PCP ([place]) 11/12/19 - BMP from 11/22/19 reviewed and showed sodium 135, potassium 4.4, creatinine 1.10 and GFR >60  3: DM- - A1c 11/09/19 was 7.6% - nonfasting glucose here today was 170 - saw endocrinology Honor Junes) 09/09/2018  4: Tobacco use- - smoking 1/2 ppd  - complete cessation discussed for 3 minutes with Alexander Cobb   Medication bottles were reviewed.   Return in 6 months or sooner for any questions/problems before then.

## 2019-11-24 NOTE — Telephone Encounter (Signed)
Patient would like a note stating when he can return to work. Please call to discuss

## 2019-11-24 NOTE — Telephone Encounter (Signed)
Patient came by to check on status

## 2019-11-25 ENCOUNTER — Encounter: Payer: Self-pay | Admitting: Physician Assistant

## 2019-11-25 ENCOUNTER — Encounter: Payer: Self-pay | Admitting: *Deleted

## 2019-11-25 ENCOUNTER — Ambulatory Visit: Payer: Medicare HMO | Attending: Family | Admitting: Family

## 2019-11-25 ENCOUNTER — Encounter: Payer: Self-pay | Admitting: Family

## 2019-11-25 ENCOUNTER — Other Ambulatory Visit: Payer: Self-pay

## 2019-11-25 VITALS — BP 148/73 | HR 69 | Resp 20 | Ht 69.0 in | Wt 187.4 lb

## 2019-11-25 DIAGNOSIS — Z79899 Other long term (current) drug therapy: Secondary | ICD-10-CM | POA: Insufficient documentation

## 2019-11-25 DIAGNOSIS — I2510 Atherosclerotic heart disease of native coronary artery without angina pectoris: Secondary | ICD-10-CM | POA: Diagnosis not present

## 2019-11-25 DIAGNOSIS — I10 Essential (primary) hypertension: Secondary | ICD-10-CM

## 2019-11-25 DIAGNOSIS — Z9989 Dependence on other enabling machines and devices: Secondary | ICD-10-CM | POA: Diagnosis not present

## 2019-11-25 DIAGNOSIS — E119 Type 2 diabetes mellitus without complications: Secondary | ICD-10-CM | POA: Diagnosis not present

## 2019-11-25 DIAGNOSIS — F17210 Nicotine dependence, cigarettes, uncomplicated: Secondary | ICD-10-CM | POA: Insufficient documentation

## 2019-11-25 DIAGNOSIS — I110 Hypertensive heart disease with heart failure: Secondary | ICD-10-CM | POA: Diagnosis not present

## 2019-11-25 DIAGNOSIS — Z720 Tobacco use: Secondary | ICD-10-CM

## 2019-11-25 DIAGNOSIS — Z7982 Long term (current) use of aspirin: Secondary | ICD-10-CM | POA: Insufficient documentation

## 2019-11-25 DIAGNOSIS — G4733 Obstructive sleep apnea (adult) (pediatric): Secondary | ICD-10-CM | POA: Insufficient documentation

## 2019-11-25 DIAGNOSIS — I5022 Chronic systolic (congestive) heart failure: Secondary | ICD-10-CM | POA: Insufficient documentation

## 2019-11-25 DIAGNOSIS — I255 Ischemic cardiomyopathy: Secondary | ICD-10-CM | POA: Diagnosis not present

## 2019-11-25 DIAGNOSIS — Z716 Tobacco abuse counseling: Secondary | ICD-10-CM | POA: Insufficient documentation

## 2019-11-25 DIAGNOSIS — Z955 Presence of coronary angioplasty implant and graft: Secondary | ICD-10-CM | POA: Insufficient documentation

## 2019-11-25 DIAGNOSIS — Z794 Long term (current) use of insulin: Secondary | ICD-10-CM | POA: Insufficient documentation

## 2019-11-25 DIAGNOSIS — Z882 Allergy status to sulfonamides status: Secondary | ICD-10-CM | POA: Diagnosis not present

## 2019-11-25 DIAGNOSIS — I252 Old myocardial infarction: Secondary | ICD-10-CM | POA: Diagnosis not present

## 2019-11-25 DIAGNOSIS — E785 Hyperlipidemia, unspecified: Secondary | ICD-10-CM | POA: Insufficient documentation

## 2019-11-25 LAB — GLUCOSE, CAPILLARY: Glucose-Capillary: 170 mg/dL — ABNORMAL HIGH (ref 70–99)

## 2019-11-25 NOTE — Telephone Encounter (Signed)
Texted Marrianne Mood, PA.  She advised she will complete this today.

## 2019-11-25 NOTE — Telephone Encounter (Signed)
Patient stopped by in office to check on the status of the letter. Patient asked for [place] to call him when it is ready

## 2019-11-25 NOTE — Patient Instructions (Signed)
Continue weighing daily and call for an overnight weight gain of > 2 pounds or a weekly weight gain of >5 pounds. 

## 2019-11-25 NOTE — Telephone Encounter (Signed)
Alexander Cobb has completed and signed a return to work note for this patient. I attempted to call him to let him know this was ready for pick up. No answer- no voice mail box set up.  Letter placed at the front desk for pick up.  Will attempt to call the patient back at a later time.

## 2019-11-26 NOTE — Telephone Encounter (Signed)
Patient came in to pick up letter. Patient also brought in a return to work form he received from his employer that needs to be filled out as soon as possible, signed and picked up by patient. He will remain out of work until this form is signed.  Form placed in nurse box  Please advise and call patient when ready at [phone]

## 2019-11-26 NOTE — Telephone Encounter (Signed)
Patient calling to check status of form completion. Patient notified processing time can be several days (7-10 discussed)   Patient aware office will call when this is complete.

## 2019-11-30 ENCOUNTER — Telehealth: Payer: Self-pay | Admitting: Physician Assistant

## 2019-11-30 NOTE — Telephone Encounter (Signed)
Spoke to Lily Kocher, [place] with [employer]. Patient is there to get his clearance for return to work. Reports he has letter from [place], [place] but needed some additional information including lastest EF, last stress test and latest EKG. Provided him with EKG assessment per Ignacia Bayley, NP note from 11/12/19, EF from echo 11/10/19, and that next stress test is scheduled for 12/07/19. He was appreciative.

## 2019-11-30 NOTE — Telephone Encounter (Signed)
Patient is in office now.

## 2019-11-30 NOTE — Telephone Encounter (Signed)
Occupational and Employee health physician assistant has a question regarding patient's return to work note from [place], [place]. Please call to discuss.

## 2019-11-30 NOTE — Telephone Encounter (Signed)
Form was completed 9/18 and faxed to the appropriate number.

## 2019-12-01 NOTE — Telephone Encounter (Signed)
Paperwork received and placed in scan bin.

## 2019-12-07 ENCOUNTER — Other Ambulatory Visit: Payer: Self-pay

## 2019-12-07 ENCOUNTER — Encounter
Admission: RE | Admit: 2019-12-07 | Discharge: 2019-12-07 | Disposition: A | Discharge status: Home / Self Care | Payer: Medicare HMO | Source: Ambulatory Visit | Attending: Physician Assistant | Admitting: Physician Assistant

## 2019-12-07 DIAGNOSIS — R0602 Shortness of breath: Secondary | ICD-10-CM | POA: Insufficient documentation

## 2019-12-08 ENCOUNTER — Encounter
Admission: RE | Admit: 2019-12-08 | Discharge: 2019-12-08 | Disposition: A | Discharge status: Home / Self Care | Payer: Medicare HMO | Source: Ambulatory Visit | Attending: Physician Assistant | Admitting: Physician Assistant

## 2019-12-08 ENCOUNTER — Other Ambulatory Visit: Payer: Self-pay

## 2019-12-08 DIAGNOSIS — R0602 Shortness of breath: Secondary | ICD-10-CM

## 2019-12-08 LAB — NM MYOCAR MULTI W/SPECT W/WALL MOTION / EF
Estimated workload: 1 METS
Exercise duration (min): 0 min
Exercise duration (sec): 0 s
LV dias vol: 136 mL (ref 62–150)
LV sys vol: 59 mL
MPHR: 151 {beats}/min
Peak HR: 97 {beats}/min
Percent HR: 64 %
Rest HR: 60 {beats}/min
SDS: 9
SRS: 10
SSS: 7
TID: 1.09

## 2019-12-08 MED ORDER — TECHNETIUM TC 99M TETROFOSMIN IV KIT
10.0000 | PACK | Freq: Once | INTRAVENOUS | Status: AC | PRN
  Administered 2019-12-08: 10.75 via INTRAVENOUS

## 2019-12-08 MED ORDER — REGADENOSON 0.4 MG/5ML IV SOLN
0.4000 mg | Freq: Once | INTRAVENOUS | Status: AC
  Administered 2019-12-08: 0.4 mg via INTRAVENOUS

## 2019-12-08 MED ORDER — TECHNETIUM TC 99M TETROFOSMIN IV KIT
30.0000 | PACK | Freq: Once | INTRAVENOUS | Status: AC | PRN
  Administered 2019-12-08: 30.506 via INTRAVENOUS

## 2019-12-09 ENCOUNTER — Ambulatory Visit (INDEPENDENT_AMBULATORY_CARE_PROVIDER_SITE_OTHER): Payer: Medicare HMO | Admitting: Nurse Practitioner

## 2019-12-09 ENCOUNTER — Encounter: Payer: Self-pay | Admitting: Nurse Practitioner

## 2019-12-09 ENCOUNTER — Other Ambulatory Visit: Payer: Self-pay

## 2019-12-09 VITALS — BP 134/64 | HR 75 | Ht 69.0 in | Wt 188.5 lb

## 2019-12-09 DIAGNOSIS — I739 Peripheral vascular disease, unspecified: Secondary | ICD-10-CM

## 2019-12-09 DIAGNOSIS — I10 Essential (primary) hypertension: Secondary | ICD-10-CM

## 2019-12-09 DIAGNOSIS — I2510 Atherosclerotic heart disease of native coronary artery without angina pectoris: Secondary | ICD-10-CM | POA: Diagnosis not present

## 2019-12-09 DIAGNOSIS — I5022 Chronic systolic (congestive) heart failure: Secondary | ICD-10-CM

## 2019-12-09 DIAGNOSIS — Z720 Tobacco use: Secondary | ICD-10-CM

## 2019-12-09 DIAGNOSIS — I255 Ischemic cardiomyopathy: Secondary | ICD-10-CM

## 2019-12-09 DIAGNOSIS — E785 Hyperlipidemia, unspecified: Secondary | ICD-10-CM

## 2019-12-09 MED ORDER — TICAGRELOR 90 MG PO TABS
90.0000 mg | ORAL_TABLET | Freq: Two times a day (BID) | ORAL | 11 refills | Status: DC
Start: 2019-12-09 — End: 2021-09-06

## 2019-12-09 NOTE — Progress Notes (Signed)
Office Visit    Patient Name: Alexander Cobb Date of Encounter: 12/09/2019  Primary Care Provider:  [name], MD Primary Cardiologist:  Ida Rogue, MD  Chief Complaint    69 y.o. male with a history of CAD (s/p non-STEMI, 2012; s/p STEMI/PCI, 10/2019), hypertension, hyperlipidemia, mixed ischenmic/nonischemic cardiomyopathy, HFrEF, PSVT, diabetes, tobacco use, OSA on CPAP, and staph aureus bacteremia who presents for a follow-up of CAD.   Past Medical History    Past Medical History:  Diagnosis Date  . CAD (coronary artery disease)    a. 12/2010 Cath: nonobs dzs; b. 10/2019 STEMI/PCI: LM nl, LAD nl, LCX 75p/m, RCA 30p, 23md, RPDA 99 (2.25x30 Resolute Onyx DES); c. 11/2019 MV: moderate apical inf fixed defect-->diaphragmatic atten/artiact. No ischemia. EF 51%. Low risk.  . Diabetes mellitus without complication ([facility]   . [place] (heart [place] with reduced ejection fraction) [place]    a. 07/2010 LV gram: EF 33%; b. 05/2018 Echo: EF 30-35%; c. 05/2018 TEE: EF 35-40%; d. 11/2019 Echo: EF 40-45%, gr1 DD. Antlat and inf HK. Nl RV fxn.  .Marland KitchenHTN (hypertension) 12/21/2010  . Hyperlipidemia   . Mixed Ischemic and non-ischemic cardiomyopathy    a. 07/2010 LV gram: EF 33%; b. 05/2018 Echo: EF 30-35%; c. 05/2018 TEE: EF 35-40%; d. 11/2019 Echo: EF 40-45%.  . OSA (obstructive sleep apnea)    Uses CPAP at home  . PSVT (paroxysmal supraventricular tachycardia) ([facility] 12/21/2010  . Tobacco abuse    Past Surgical History:  Procedure Laterality Date  . CARDIAC CATHETERIZATION    . COLONOSCOPY WITH PROPOFOL N/A 11/07/2015   Procedure: COLONOSCOPY WITH PROPOFOL;  Surgeon: MLollie Sails MD;  [facility];  Service: Endoscopy;  Laterality: N/A;  . CORONARY/GRAFT ACUTE MI REVASCULARIZATION N/A 11/09/2019   Procedure: Coronary/Graft Acute MI Revascularization;  Surgeon: CYolonda Kida MD;  [facility];  Service: Cardiovascular;  Laterality: N/A;  . LEFT HEART  CATH AND CORONARY ANGIOGRAPHY N/A 11/09/2019   Procedure: LEFT HEART CATH AND CORONARY ANGIOGRAPHY;  Surgeon: CYolonda Kida MD;  [facility];  Service: Cardiovascular;  Laterality: N/A;  . TEE WITHOUT CARDIOVERSION N/A 05/18/2018   Procedure: TRANSESOPHAGEAL ECHOCARDIOGRAM (TEE);  Surgeon: AWellington Hampshire MD;  Location: [facility];  Service: Cardiovascular;  Laterality: N/A;    Allergies  Allergies  Allergen Reactions  . Sulfa Drugs Cross Reactors Hives    Hives     History of Present Illness    69y.o. male with the above past medical history including CAD (s/p non-STEMI, 2012; s/p STEMI/PCI, 10/2019), hypertension, hyperlipidemia, mixed ischenmic/nonischemic cardiomyopathy, HFrEF, PSVT, diabetes, tobacco use, OSA on CPAP, and staph aureus bacteremia. Cardiac history dates back to May of 2012, following a non-STEMI. He underwent LHC, which revealed 75% PDA and mild pRCA disease for which he was treated medically, EF 33 % per angiography. Follow-up echo in October 2012 showed an EF of 40-45%. In 11-2014 he wore an event monitor in the setting of chest pain and palpitations (he declined stress testing). Monitor revealed runs of SVT, but otherwise unremarkable. His symptoms resolved without interventions. In March of 2020 he was admitted w/ respiratory failure, CHF, and MSSA bacteriemia requiring CPR and intubation. TEE was negative for endocarditis, EF 35-40%. He was treated with IV antibiotics.   Following his hospitalization he remained stable from a cardiac standpoint, on medical management for CHF ( blocker, Entresto), until [facility]09/19, [facility]  to have inferior ST elevation consistent with STEMI. He underwent emergent diagnostic catheterization revealing severe 99% RPDA disease, successfully treated with a DES. He had residual, moderate prox and mid circumflex disease (75% stenosis). Post PCI, echo showed an EF 40-45%, grade I DD. He  was maintained on  blocker therapy and Entresto. Spironalactone was added and his lipitor was increased to 80 mg daily. He was seen for follow-up post PCI on 11/12/19 and was doing well overall.  He returned to the clinic on 11/23/19 following and ED visit [place] the [place] 2/2 infection and hematoma to the R groin cath site. He was started on oral antibiotics with improvement. He was cleared to return to work, given lack of symptoms, with agreement to obtain a nuclear study to further assess his residual disease (p/m LCx 75%). He underwent a myoview on 12/08/19 which showed a fixed medium defect in the apical inferior location due to diaphragmatic attenuation/artifact, no evidence of ischemia, overall low-risk study. EF 51%.    He returns today for follow-up post Myoview. Since his last visit he has been feeling well. His states his groin site is fully healed and he is back at work. He does have chronic mild dyspnea on exertion, which remains unchanged. He states that since his catheterization, he has noticed his R arm is frequently cold. He denies numbness or tingling to the R arm. He is still smoking 1/2 ppd, though he states he is "working on quitting." He denies chest pain, palpitations, pnd, orthopnea, n, v, dizziness, syncope, edema, weight gain, or early satiety.   Home Medications    Prior to Admission medications   Medication Sig Start Date End Date Taking? Authorizing Provider  aspirin 81 MG tablet Take 81 mg by mouth daily. Reported on 06/30/2015    [provider]  atorvastatin (LIPITOR) 80 MG tablet Take 1 tablet (80 mg total) by mouth daily. 11/12/19   Theora Gianotti, NP  blood glucose meter kit and supplies KIT Dispense based on patient and insurance preference. Use up to four times daily as directed. (FOR ICD-9 250.00, 250.01). 05/20/18   Gouru, Illene Silver, MD  carvedilol (COREG) 6.25 MG tablet Take 1 tablet (6.25 mg total) by mouth 2 (two) times daily. 11/12/19    Theora Gianotti, NP  cephALEXin (KEFLEX) 500 MG capsule Take 500 mg by mouth 3 (three) times daily. 11/17/19   [provider]  Insulin Glargine (LANTUS SOLOSTAR) 100 UNIT/ML Solostar Pen Inject 15 Units into the skin daily. 05/20/18   Nicholes Mango, MD  Insulin Pen Needle 32G X 4 MM MISC 15 Units by Does not apply route daily at 10 pm. 05/20/18   Nicholes Mango, MD  metFORMIN (GLUCOPHAGE) 500 MG tablet Take 1 tablet (500 mg total) by mouth 2 (two) times daily with a meal. 05/20/18   Gouru, Aruna, MD  methimazole (TAPAZOLE) 5 MG tablet Take 2.5 mg by mouth daily. 08/26/19   [provider]  Multiple Vitamin (MULTIVITAMIN) tablet Take 1 tablet by mouth daily.    [provider]  nicotine (NICOTINE STEP 1) 21 mg/24hr patch Place 1 patch (21 mg total) onto the skin daily. 11/23/19   Marrianne Mood D, PA-C  nitroGLYCERIN (NITROSTAT) 0.4 MG SL tablet Place 1 tablet (0.4 mg total) under the tongue every 5 (five) minutes as needed for chest pain. 04/15/19 11/22/28  Alisa Graff, FNP  sacubitril-valsartan (ENTRESTO) 97-103 MG Take 1 tablet by mouth 2 (two) times daily.  [provider]  spironolactone (ALDACTONE) 25 MG tablet Take 1 tablet (25 mg total) by mouth daily. 11/12/19   Theora Gianotti, NP  ticagrelor (BRILINTA) 90 MG TABS tablet Take 1 tablet (90 mg total) by mouth 2 (two) times daily. 11/12/19   Theora Gianotti, NP    Review of Systems    Chronic mild dyspnea on exertion. Frequent cold sensation to RUE. R hip pain w/ ambulation. He denies chest pain, palpitations, pnd, orthopnea, n, v, dizziness, syncope, edema, weight gain, or early satiety. All other systems reviewed and are otherwise negative except as noted above.  Physical Exam    VS:  BP 134/64 (BP Location: Left Arm, Patient Position: Sitting, Cuff Size: Normal)   Pulse 75   Ht _0  (1.753 m)   Wt 188 lb 8 oz (85.5 kg)   SpO2 98%   BMI 27.84 kg/m  , BMI Body mass index is  27.84 kg/m. GEN: Well nourished, well developed, in no acute distress. HEENT: normal. Neck: Supple, no JVD, carotid bruits, or masses. Cardiac: RRR, no murmurs, rubs, or gallops. No clubbing, cyanosis, edema. PT 1+ and equal bilaterally. R femoral site without bleeding or hematoma, soft bilat fem bruits.  Respiratory:  Respirations regular and unlabored, clear to auscultation bilaterally. GI: Soft, nontender, nondistended, BS + x 4. MS: no deformity or atrophy. Skin: warm and dry, no rash. Neuro:  Strength and sensation are intact. Psych: Normal affect.  Accessory Clinical Findings    ECG personally reviewed by me today - Normal sinus rhythm, 75, inferolateral T abnormalities - no acute changes.  Lab Results  Component Value Date   WBC 11.9 (H) 11/10/2019   HGB 15.9 11/10/2019   HCT 44.7 11/10/2019   MCV 87.1 11/10/2019   PLT 177 11/10/2019   Lab Results  Component Value Date   CREATININE 1.10 11/22/2019   BUN 16 11/22/2019   NA 135 11/22/2019   K 4.4 11/22/2019   CL 104 11/22/2019   CO2 23 11/22/2019   Lab Results  Component Value Date   ALT 16 05/16/2018   AST 21 05/16/2018   ALKPHOS 64 05/16/2018   BILITOT 0.9 05/16/2018   Lab Results  Component Value Date   CHOL 138 11/10/2019   HDL 41 11/10/2019   LDLCALC 76 11/10/2019   TRIG 105 11/10/2019   CHOLHDL 3.4 11/10/2019    Lab Results  Component Value Date   HGBA1C 7.6 (H) 11/09/2019    Assessment & Plan    1.  CAD: S/p inferior STEMI (11/09/19) with severe RPDA disease s/p DES. Residual left Cx disease (75% stenosis), medically managed. R groin arteriotomy site infection, resolved with Keflex. Myoview on 11/07/19 to evaluate burden of residual disease was low risk, no evidence of ischemia. He denies any symptoms of chest pain or dyspnea. He is back at work and feeling well overall. Given lack of symptoms and normal Myoview, will continue with medical management of residual left circumflex disease. Continue ASA,  brilinta (refill sent today), beta-blocker, and high intensity statin.   2. Mixed ischemic and nonischemic cardiomyopathy/HFrEF: Prior hx NICM, EF as low as 35-40%. S/p inferior STEMI. Follow-up echo showed EF 40-45%. He is Euvolemic on exam today. Encouraged him to record BP at home, if readings consistently > 130/80, would consider titration of beta-blocker. Continue Entresto, spironolactone. Will consider SGLT2 in the future.   3. Essential hypertension: BP 134/64 in office today. He has not been checking his BP at home. Encouraged him to  record BP at home, if readings consistently > 130/80, would consider titration of beta-blocker. Continue current carvedilol, Entresto and spironolactone.   4. Hyperlipidemia: Recent LDL of 76 on atorvastatin 40 mg daily, and thus dose escalated to 71m daily in the setting of recent STEMI.  Plan f/u fasting lipids at next visit.  5. Type 2 diabetes: History of poorly controlled diabetes. Recent A1c 7.6. Currently on metformin 500 mg bid and Lantus 15 units daily, managed by PCP. Consider addition of SGLT2 inhibitor in the future in the setting of chronic heart failure.   6. Tobacco abuse: He continues to smoke 1/2 ppd. He was prescribed a nicotine patch at his last visit, but has not yet started using it. He states he is "working on it." Continued to advise complete cessation.   7. Right hip claudication:  bilat fem bruits noted on exam today.  Upon further questioning, he does report R hip claudication after walking for long distances, esp when walking on sand, as he recently did @ the beach.  Will arrange for ABI's and aortoiliac duplex.  Cont asa/statin.  8.  Disposition: Repeat lipids/LFTs in 2-4 weeks. Schedule ABIs/Aoiliac duplex. Continue current medical therapy for CAD/HFrEF. Follow-up in 3 months.   CMurray Hodgkins NP 12/09/2019, 4:52 PM

## 2019-12-09 NOTE — Patient Instructions (Addendum)
Medication Instructions:  - Your physician recommends that you continue on your current medications as directed. Please refer to the Current Medication list given to you today.  - Brilinta has been refilled today  *If you need a refill on your cardiac medications before your next appointment, please call your pharmacy*   Lab Work: - none ordered  If you have labs (blood work) drawn today and your tests are completely normal, you will receive your results only by: Marland Kitchen MyChart Message (if you have MyChart) OR . A paper copy in the mail If you have any lab test that is abnormal or we need to change your treatment, we will call you to review the results.   Testing/Procedures: - Your physician has requested that you have an ankle brachial index (ABI). During this test an ultrasound and blood pressure cuff are used to evaluate the arteries that supply the arms and legs with blood. Allow thirty minutes for this exam. There are no restrictions or special instructions.  - Your physician has requested that you have a lower extremity arterial duplex. This test is an ultrasound of the arteries in the legs. It looks at arterial blood flow in the legs. Allow one hour for Lower Arterial scans. There are no restrictions or special instructions   Follow-Up: At [facility], you and your health needs are our priority.  As part of our continuing mission to provide you with exceptional heart care, we have created designated Provider Care Teams.  These Care Teams include your primary Cardiologist (physician) and Advanced Practice Providers (APPs -  Physician Assistants and Nurse Practitioners) who all work together to provide you with the care you need, when you need it.  We recommend signing up for the patient portal called "MyChart".  Sign up information is provided on this After Visit Summary.  MyChart is used to connect with patients for Virtual Visits (Telemedicine).  Patients are able to view lab/test  results, encounter notes, upcoming appointments, etc.  Non-urgent messages can be sent to your provider as well.   To learn more about what you can do with MyChart, go to [email or web address].    Your next appointment:   As scheduled   The format for your next appointment:   in person  Provider:   Ida Rogue, MD   Other Instructions   Ankle-Brachial Index Test Why am I having this test? The ankle-brachial index (ABI) test is used to diagnose peripheral vascular disease (PVD). PVD is also known as peripheral arterial disease (PAD). PVD is the blocking or hardening of the arteries anywhere within the circulatory system beyond the heart. PVD is caused by:  Cholesterol deposits in your blood vessels (atherosclerosis). This is the most common cause of this condition.  Irritation and swelling (inflammation) in the blood vessels.  Blood clots in the vessels. Cholesterol deposits cause arteries to narrow. Normal delivery of oxygen to your tissues is affected, causing muscle pain and fatigue. This is called claudication. PVD means that there may also be a buildup of cholesterol:  In your heart. This increases the risk of heart attacks.  In your brain. This increases the risk of strokes. What is being tested? The ankle-brachial index test measures the blood flow in your arms and legs. The blood flow will show if blood vessels in your legs have been narrowed by cholesterol deposits. How do I prepare for this test?  Wear loose clothing.  Do not use any tobacco products, including cigarettes, chewing tobacco, or e-cigarettes, for  at least 30 minutes before the test. What happens during the test?  1. You will lie down in a resting position. 2. Your health care provider will use a blood pressure machine and a small ultrasound device (Doppler) to measure the systolic pressures on your upper arms and ankles. Systolic pressure is the pressure inside your arteries when your heart  pumps. 3. Systolic pressure measurements will be taken several times, and at several points, on both the ankle and the arm. 4. Your health care provider will divide the highest systolic pressure of the ankle by the highest systolic pressure of the arm. The result is the ankle-brachial pressure ratio, or ABI. Sometimes this test will be repeated after you have exercised on a treadmill for 5 minutes. You may have leg pain during the exercise portion of the test if you suffer from PVD. If the index number drops after exercise, this may show that PVD is present. How are the results reported? Your test results will be reported as a value that shows the ratio of your ankle pressure to your arm pressure (ABI ratio). Your health care provider will compare your results to normal ranges that were established after testing a large group of people (reference ranges). Reference ranges may vary among labs and hospitals. For this test, a common reference range is:  ABI ratio of 0.9 to 1.3. What do the results mean? An ABI ratio that is below the reference range is considered abnormal and may indicate PVD in the legs. Talk with your health care provider about what your results mean. Questions to ask your health care provider Ask your health care provider, or the department that is doing the test:  When will my results be ready?  How will I get my results?  What are my treatment options?  What other tests do I need?  What are my next steps? Summary  The ankle-brachial index (ABI) test is used to diagnose peripheral vascular disease (PVD). PVD is also known as peripheral arterial disease (PAD).  The ankle-brachial index test measures the blood flow in your arms and legs.  The highest systolic pressure of the ankle is divided by the highest systolic pressure of the arm. The result is the ABI ratio.  An ABI ratio that is below 0.9 is considered abnormal and may indicate PVD in the legs. This information  is not intended to replace advice given to you by your health care provider. Make sure you discuss any questions you have with your health care provider. Document Revised: 12/18/2017 Document Reviewed: 11/19/2016 Elsevier Patient Education  2020 [employer].

## 2019-12-17 ENCOUNTER — Ambulatory Visit: Payer: Medicare HMO | Admitting: Physician Assistant

## 2020-01-03 ENCOUNTER — Other Ambulatory Visit: Payer: Self-pay

## 2020-01-03 ENCOUNTER — Ambulatory Visit (INDEPENDENT_AMBULATORY_CARE_PROVIDER_SITE_OTHER): Payer: Medicare HMO

## 2020-01-03 DIAGNOSIS — I739 Peripheral vascular disease, unspecified: Secondary | ICD-10-CM | POA: Diagnosis not present

## 2020-01-06 ENCOUNTER — Telehealth: Payer: Self-pay | Admitting: *Deleted

## 2020-01-06 NOTE — Telephone Encounter (Signed)
Scheduled 11/05

## 2020-01-06 NOTE — Telephone Encounter (Signed)
Reviewed results and recommendations with patient. Advised that I would have someone with scheduling give him a call to set up appointment with Dr. Fletcher Anon. He verbalized understanding of our conversation, agreement with plan, and had no further questions at this time.

## 2020-01-06 NOTE — Telephone Encounter (Signed)
-----   Message from Theora Gianotti, NP sent at 01/06/2020  7:32 AM EDT ----- Evidence of moderate blockage in the arteries supplying his legs, more so on the right.  That said, pressures in the lower legs were nl. Recommend vascular evaluation with Dr. Fletcher Anon based on duplex findings and right hip claudication.

## 2020-01-14 ENCOUNTER — Encounter: Payer: Self-pay | Admitting: Cardiovascular Disease

## 2020-01-14 ENCOUNTER — Other Ambulatory Visit: Payer: Self-pay

## 2020-01-14 ENCOUNTER — Ambulatory Visit (INDEPENDENT_AMBULATORY_CARE_PROVIDER_SITE_OTHER): Payer: Medicare HMO | Admitting: Cardiovascular Disease

## 2020-01-14 VITALS — BP 130/80 | HR 69 | Ht 69.0 in | Wt 187.4 lb

## 2020-01-14 DIAGNOSIS — I5022 Chronic systolic (congestive) heart failure: Secondary | ICD-10-CM | POA: Diagnosis not present

## 2020-01-14 DIAGNOSIS — I255 Ischemic cardiomyopathy: Secondary | ICD-10-CM | POA: Diagnosis not present

## 2020-01-14 DIAGNOSIS — I10 Essential (primary) hypertension: Secondary | ICD-10-CM | POA: Diagnosis not present

## 2020-01-14 NOTE — Progress Notes (Signed)
Cardiology Office Note   Date:  01/14/2020   ID:  Alexander Cobb, DOB 1951/02/22, MRN [id number]  PCP:  [name], MD  Cardiologist: Dr. [name]  Chief Complaint  Patient presents with  . other    2 month follow up. Meds reviewed by the pt. verbally. "doing well."       History of Present Illness: Alexander Cobb is a 69 y.o. male who was referred by Ignacia Bayley for evaluation management of peripheral arterial disease. He has chronic medical conditions including coronary artery disease status post CABG and PCI, chronic systolic heart failure, essential hypertension, hyperlipidemia, PSVT, diabetes mellitus, tobacco use, obstructive sleep apnea on CPAP and previous staph aureus bacteremia.  He is mostly bothered by right hip pain with walking.  No calf discomfort.  No lower extremity ulceration. He had Doppler studies last month which showed normal ABI bilaterally.  There was moderate stenosis in the right common femoral artery with peak velocity of 265.  He smokes almost 1 pack/day and has been doing so for 7 years.  Past Medical History:  Diagnosis Date  . CAD (coronary artery disease)    a. 12/2010 Cath: nonobs dzs; b. 10/2019 STEMI/PCI: LM nl, LAD nl, LCX 75p/m, RCA 30p, 52md, RPDA 99 (2.25x30 Resolute Onyx DES); c. 11/2019 MV: moderate apical inf fixed defect-->diaphragmatic atten/artiact. No ischemia. EF 51%. Low risk.  . [place]   . HFrEF (heart failure with reduced ejection fraction) ([place]    a. 07/2010 LV gram: EF 33%; b. 05/2018 Echo: EF 30-35%; c. 05/2018 TEE: EF 35-40%; d. 11/2019 Echo: EF 40-45%, gr1 DD. Antlat and inf HK. Nl RV fxn.  .Marland KitchenHTN (hypertension) 12/21/2010  . Hyperlipidemia   . Mixed Ischemic and non-ischemic cardiomyopathy    a. 07/2010 LV gram: EF 33%; b. 05/2018 Echo: EF 30-35%; c. 05/2018 TEE: EF 35-40%; d. 11/2019 Echo: EF 40-45%.  . OSA (obstructive sleep apnea)    Uses CPAP at home  . PSVT (paroxysmal  supraventricular tachycardia) ([place] 12/21/2010  . Tobacco abuse     Past Surgical History:  Procedure Laterality Date  . CARDIAC CATHETERIZATION    . COLONOSCOPY WITH PROPOFOL N/A 11/07/2015   Procedure: COLONOSCOPY WITH PROPOFOL;  Surgeon: MLollie Sails MD;  [facility];  Service: Endoscopy;  Laterality: N/A;  . CORONARY/GRAFT ACUTE MI REVASCULARIZATION N/A 11/09/2019   Procedure: Coronary/Graft Acute MI Revascularization;  Surgeon: CYolonda Kida MD;  [facility];  Service: Cardiovascular;  Laterality: N/A;  . LEFT HEART CATH AND CORONARY ANGIOGRAPHY N/A 11/09/2019   Procedure: LEFT HEART CATH AND CORONARY ANGIOGRAPHY;  Surgeon: CYolonda Kida MD;  [facility];  Service: Cardiovascular;  Laterality: N/A;  . TEE WITHOUT CARDIOVERSION N/A 05/18/2018   Procedure: TRANSESOPHAGEAL ECHOCARDIOGRAM (TEE);  Surgeon: AWellington Hampshire MD;  Location: [facility] ORS;  Service: Cardiovascular;  Laterality: N/A;     Current Outpatient Medications  Medication Sig Dispense Refill  . aspirin 81 MG tablet Take 81 mg by mouth daily. Reported on 06/30/2015    . atorvastatin (LIPITOR) 80 MG tablet Take 1 tablet (80 mg total) by mouth daily. 90 tablet 3  . blood glucose meter kit and supplies KIT Dispense based on patient and insurance preference. Use up to four times daily as directed. (FOR ICD-9 250.00, 250.01). 1 each 0  . carvedilol (COREG) 6.25 MG tablet Take 1 tablet (6.25 mg total) by mouth 2 (two) times daily. 1[place]  tablet 3  . Insulin Glargine (LANTUS SOLOSTAR) 100 UNIT/ML Solostar Pen Inject 15 Units into the skin daily. 100 mL 1  . Insulin Pen Needle 32G X 4 MM MISC 15 Units by Does not apply route daily at 10 pm. 90 each 0  . metFORMIN (GLUCOPHAGE) 500 MG tablet Take 1 tablet (500 mg total) by mouth 2 (two) times daily with a meal. 60 tablet 1  . methimazole (TAPAZOLE) 5 MG tablet Take 2.5 mg by mouth daily.    . Multiple Vitamin (MULTIVITAMIN)  tablet Take 1 tablet by mouth daily.    . nicotine (NICOTINE STEP 1) 21 mg/24hr patch Place 1 patch (21 mg total) onto the skin daily. 28 patch 0  . nitroGLYCERIN (NITROSTAT) 0.4 MG SL tablet Place 1 tablet (0.4 mg total) under the tongue every 5 (five) minutes as needed for chest pain. 90 tablet 3  . sacubitril-valsartan (ENTRESTO) 97-103 MG Take 1 tablet by mouth 2 (two) times daily.    Marland Kitchen spironolactone (ALDACTONE) 25 MG tablet Take 1 tablet (25 mg total) by mouth daily. 30 tablet 6  . ticagrelor (BRILINTA) 90 MG TABS tablet Take 1 tablet (90 mg total) by mouth 2 (two) times daily. 60 tablet 11   No current facility-administered medications for this visit.    Allergies:   Sulfa drugs cross reactors    Social History:  The patient  reports that he has been smoking cigarettes. He has a 40.00 pack-year smoking history. He has never used smokeless tobacco. He reports previous alcohol use. He reports that he does not use drugs.   Family History:  The patient's family history includes COPD in his father.    ROS:  Please see the history of present illness.   Otherwise, review of systems are positive for none.   All other systems are reviewed and negative.    PHYSICAL EXAM: VS:  BP 130/80 (BP Location: Left Arm, Patient Position: Sitting, Cuff Size: Normal)   Pulse 69   Ht _0  (1.753 m)   Wt 187 lb 6 oz (85 kg)   BMI 27.67 kg/m  , BMI Body mass index is 27.67 kg/m. GEN: Well nourished, well developed, in no acute distress  HEENT: normal  Neck: no JVD, carotid bruits, or masses Cardiac: RRR; no murmurs, rubs, or gallops,no edema  Respiratory:  clear to auscultation bilaterally, normal work of breathing GI: soft, nontender, nondistended, + BS MS: no deformity or atrophy  Skin: warm and dry, no rash Neuro:  Strength and sensation are intact Psych: euthymic mood, full affect Vascular: Femoral pulses normal bilaterally.  Dorsalis pedis is +2 bilaterally.  Posterior tibial is +1 on the  right and +2 on the left.   EKG:  EKG is ordered today. The ekg ordered today demonstrates normal sinus rhythm with nonspecific T wave changes   Recent Labs: 11/10/2019: Hemoglobin 15.9; Platelets 177 11/22/2019: BUN 16; Creatinine, Ser 1.10; Potassium 4.4; Sodium 135    Lipid Panel    Component Value Date/Time   CHOL 138 11/10/2019 0734   CHOL 177 06/10/2014 0814   TRIG 105 11/10/2019 0734   HDL 41 11/10/2019 0734   HDL 42 06/10/2014 0814   CHOLHDL 3.4 11/10/2019 0734   VLDL 21 11/10/2019 0734   LDLCALC 76 11/10/2019 0734   LDLCALC 118 (H) 06/10/2014 0814      Wt Readings from Last 3 Encounters:  01/14/20 187 lb 6 oz (85 kg)  12/09/19 188 lb 8 oz (85.5 kg)  11/25/19 187  lb 6 oz (85 kg)       No flowsheet data found.    ASSESSMENT AND PLAN:  1.  Peripheral arterial disease: The patient does have evidence of peripheral arterial disease.  However, I do not think his right hip discomfort is related to claudication.  There is no evidence of iliac disease to explain this and his femoral pulses are normal.  He does have moderate right common femoral artery stenosis but that does not explain hip pain.  Significant right common femoral artery stenosis would be expected to cause thigh and calf claudication which the patient does not have at the present time.  I recommend treatment of of risk factors especially smoking cessation  2.  Coronary artery disease involving bypass grafts without angina: He reports no anginal symptoms recently.  Continue medical therapy.  3.  Chronic systolic heart failure with an EF of 40 to 45%: He appears to be euvolemic and on optimal medical therapy.  4.  Essential hypertension: Blood pressure is well controlled.  5.  Hyperlipidemia: Continue treatment with atorvastatin with a target LDL of less than 70.  6.  Tobacco use: I discussed with him the importance of smoking cessation.    Disposition:   FU with me as needed.  Signed,  Kathlyn Sacramento, MD  01/14/2020 3:53 PM    [place]

## 2020-01-14 NOTE — Patient Instructions (Addendum)
Medication Instructions:  - Your physician recommends that you continue on your current medications as directed. Please refer to the Current Medication list given to you today.  *If you need a refill on your cardiac medications before your next appointment, please call your pharmacy*   Lab Work: - none ordered  If you have labs (blood work) drawn today and your tests are completely normal, you will receive your results only by: Marland Kitchen MyChart Message (if you have MyChart) OR . A paper copy in the mail If you have any lab test that is abnormal or we need to change your treatment, we will call you to review the results.   Testing/Procedures: - none ordered   Follow-Up: At [facility], you and your health needs are our priority.  As part of our continuing mission to provide you with exceptional heart care, we have created designated Provider Care Teams.  These Care Teams include your primary Cardiologist (physician) and Advanced Practice Providers (APPs -  Physician Assistants and Nurse Practitioners) who all work together to provide you with the care you need, when you need it.  We recommend signing up for the patient portal called "MyChart".  Sign up information is provided on this After Visit Summary.  MyChart is used to connect with patients for Virtual Visits (Telemedicine).  Patients are able to view lab/test results, encounter notes, upcoming appointments, etc.  Non-urgent messages can be sent to your provider as well.   To learn more about what you can do with MyChart, go to [email or web address].    Your next appointment:   As needed   The format for your next appointment:   In Person  Provider:  Kathlyn Sacramento, MD    Other Instructions   Steps to Quit Smoking Smoking tobacco is the leading cause of preventable death. It can affect almost every organ in the body. Smoking puts you and those around you at risk for developing many serious chronic diseases. Quitting smoking  can be difficult, but it is one of the best things that you can do for your health. It is never too late to quit. How do I get ready to quit? When you decide to quit smoking, create a plan to help you succeed. Before you quit:  Pick a date to quit. Set a date within the next 2 weeks to give you time to prepare.  Write down the reasons why you are quitting. Keep this list in places where you will see it often.  Tell your family, friends, and co-workers that you are quitting. Support from your loved ones can make quitting easier.  Talk with your health care provider about your options for quitting smoking.  Find out what treatment options are covered by your health insurance.  Identify people, places, things, and activities that make you want to smoke (triggers). Avoid them. What first steps can I take to quit smoking?  Throw away all cigarettes at home, at work, and in your car.  Throw away smoking accessories, such as Scientist, research (medical).  Clean your car. Make sure to empty the ashtray.  Clean your home, including curtains and carpets. What strategies can I use to quit smoking? Talk with your health care provider about combining strategies, such as taking medicines while you are also receiving in-person counseling. Using these two strategies together makes you more likely to succeed in quitting than if you used either strategy on its own.  If you are pregnant or breastfeeding, talk with your health care  provider about finding counseling or other support strategies to quit smoking. Do not take medicine to help you quit smoking unless your health care provider tells you to do so. To quit smoking: Quit right away  Quit smoking completely, instead of gradually reducing how much you smoke over a period of time. Research shows that stopping smoking right away is more successful than gradually quitting.  Attend in-person counseling to help you build problem-solving skills. You are more  likely to succeed in quitting if you attend counseling sessions regularly. Even short sessions of 10 minutes can be effective. Take medicine You may take medicines to help you quit smoking. Some medicines require a prescription and some you can purchase over-the-counter. Medicines may have nicotine in them to replace the nicotine in cigarettes. Medicines may:  Help to stop cravings.  Help to relieve withdrawal symptoms. Your health care provider may recommend:  Nicotine patches, gum, or lozenges.  Nicotine inhalers or sprays.  Non-nicotine medicine that is taken by mouth. Find resources Find resources and support systems that can help you to quit smoking and remain smoke-free after you quit. These resources are most helpful when you use them often. They include:  Online chats with a Social worker.  Telephone quitlines.  Printed Furniture conservator/restorer.  Support groups or group counseling.  Text messaging programs.  Mobile phone apps or applications. Use apps that can help you stick to your quit plan by providing reminders, tips, and encouragement. There are many free apps for mobile devices as well as websites. Examples include Quit Guide from the [employer] and smokefree.gov What things can I do to make it easier to quit?   Reach out to your family and friends for support and encouragement. Call telephone quitlines (1-800-QUIT-NOW), reach out to support groups, or work with a counselor for support.  Ask people who smoke to avoid smoking around you.  Avoid places that trigger you to smoke, such as bars, parties, or smoke-break areas at work.  Spend time with people who do not smoke.  Lessen the stress in your life. Stress can be a smoking trigger for some people. To lessen stress, try: ? Exercising regularly. ? Doing deep-breathing exercises. ? Doing yoga. ? Meditating. ? Performing a body scan. This involves closing your eyes, scanning your body from head to toe, and noticing which  parts of your body are particularly tense. Try to relax the muscles in those areas. How will I feel when I quit smoking? Day 1 to 3 weeks Within the first 24 hours of quitting smoking, you may start to feel withdrawal symptoms. These symptoms are usually most noticeable 2-3 days after quitting, but they usually do not last for more than 2-3 weeks. You may experience these symptoms:  Mood swings.  Restlessness, anxiety, or irritability.  Trouble concentrating.  Dizziness.  Strong cravings for sugary foods and nicotine.  Mild weight gain.  Constipation.  Nausea.  Coughing or a sore throat.  Changes in how the medicines that you take for unrelated issues work in your body.  Depression.  Trouble sleeping (insomnia). Week 3 and afterward After the first 2-3 weeks of quitting, you may start to notice more positive results, such as:  Improved sense of smell and taste.  Decreased coughing and sore throat.  Slower heart rate.  Lower blood pressure.  Clearer skin.  The ability to breathe more easily.  Fewer sick days. Quitting smoking can be very challenging. Do not get discouraged if you are not successful the first time.  Some people need to make many attempts to quit before they achieve long-term success. Do your best to stick to your quit plan, and talk with your health care provider if you have any questions or concerns. Summary  Smoking tobacco is the leading cause of preventable death. Quitting smoking is one of the best things that you can do for your health.  When you decide to quit smoking, create a plan to help you succeed.  Quit smoking right away, not slowly over a period of time.  When you start quitting, seek help from your health care provider, family, or friends. This information is not intended to replace advice given to you by your health care provider. Make sure you discuss any questions you have with your health care provider. Document Revised:  11/20/2018 Document Reviewed: 05/16/2018 Elsevier Patient Education  [place].

## 2020-02-07 NOTE — Progress Notes (Signed)
No old Cardiology Office Note  Date:  02/08/2020   ID:  Alexander Cobb, DOB 20-Apr-1950, MRN [id number]  PCP:  [name], MD   Chief Complaint  Patient presents with  . other    6 month follow up. Meds reviewed by the pt. verbally. "doing well."     HPI:  Alexander Cobb is a very pleasant 69 year old gentleman with a history of smoking, who continues to smoke, 1ppd poorly controlled diabetes,  nonischemic cardiopathy, Non-STEMI, ejection fraction by catheterization estimated at 33% in May of 2012, mild proximal RCA disease, 75% PDA disease,  Hospitalization for pneumonia, staph auris bacteremia March 2020 who presents for routine followup of his coronary artery disease  STEMI 10/2019  Prox Cx lesion is 75% stenosed.  Mid Cx lesion is 75% stenosed.  RPDA lesion is 99% stenosed.  Prox RCA lesion is 30% stenosed.  Mid RCA lesion is 30% stenosed.  A drug-eluting stent was successfully placed using a STENT RESOLUTE ONYX 2.25X30.  Post intervention, there is a 0% residual stenosis.  Severely depressed overall left ventricular function around 25 to 30% with inferior akinesis left ventricular enlargement   Patient presented with inferior ST elevation  After evaluation emergency room patient was brought to the cardiac Cath Lab for diagnostic cardiac cath possible PCI and stent LV function is globally depressed at around 25 to 30% with inferior akinesis Coronaries Long left main relatively free of disease LAD is moderate in size relatively free of disease Circumflex is moderate in size with OM1 with tandem 75% lesions RCA was a large vessel with anterior takeoff with mild to moderate disease proximal to mid but the PDA was diffusely diseased focally of around 99% long lesion TIMI-3 flow Successful PCI and stent of PDA with a 2.25 x 30 mm resolute Onyx reducing the lesion from 99 down to 0% Patient was maintained on Brilinta and aspirin  Lab work reviewed HBA1C 7.2 to 11,  back down to 7.6 more recently  Echocardiogram results reviewed with him,  previous ejection fraction 30 to 35% in 2020 Now up to 40 to 45% on current medications  Continues to work with heavy machinery No desire to retire no exercise program  EKG personally reviewed by myself on todays visit Shows normal sinus rhythm rate 72 bpm nonspecific ST-T wave abnormality anterolateral leads, no change from prior EKGs  Other past medical history reviewed Previous event monitor showing SVT Still having flutters , tachycardia 30 sec episode today, got dizzy Resolved without intervention Has approximately 1-2 x a week  05/2018 in the hospital , acute respiratory distress, Saturations in the 70s, While in the hospital became apneic CPR was initiated and the patient was intubated.  Pneumonia seen on CT scan.  Diagnosed with Staph aureus bacteremia. abscess right buttock area.  Treated with IV Ancef. TEE negative foe endocarditis.    Reports he has recovered well since that time  Echo 05/2018 moderate-severely reduced systolic function, with an ejection fraction of 30-35%.  TEE 35-40% 05/2018  The symptoms make him feel weak,. Symptoms can go up to 5-10 minutes, on average 2-3 minutes at a time.   Echocardiogram 2012 showed ejection fraction 40-45%.   PMH:   has a past medical history of CAD (coronary artery disease), Diabetes mellitus without complication ([place]), HFrEF (heart failure with reduced ejection fraction) ([place]), HTN (hypertension) (12/21/2010), Hyperlipidemia, Mixed Ischemic and non-ischemic cardiomyopathy, OSA (obstructive sleep apnea), PSVT (paroxysmal supraventricular tachycardia) ([place]) (12/21/2010), and Tobacco abuse.  PSH:  Past Surgical History:  Procedure Laterality Date  . CARDIAC CATHETERIZATION    . COLONOSCOPY WITH PROPOFOL N/A 11/07/2015   Procedure: COLONOSCOPY WITH PROPOFOL;  Surgeon: Lollie Sails, MD;  Location: [facility] ENDOSCOPY;  Service: Endoscopy;   Laterality: N/A;  . CORONARY/GRAFT ACUTE MI REVASCULARIZATION N/A 11/09/2019   Procedure: Coronary/Graft Acute MI Revascularization;  Surgeon: Yolonda Kida, MD;  Location: [facility] CV LAB;  Service: Cardiovascular;  Laterality: N/A;  . LEFT HEART CATH AND CORONARY ANGIOGRAPHY N/A 11/09/2019   Procedure: LEFT HEART CATH AND CORONARY ANGIOGRAPHY;  Surgeon: Yolonda Kida, MD;  Location: [place] CV LAB;  Service: Cardiovascular;  Laterality: N/A;  . TEE WITHOUT CARDIOVERSION N/A 05/18/2018   Procedure: TRANSESOPHAGEAL ECHOCARDIOGRAM (TEE);  Surgeon: Wellington Hampshire, MD;  Location: [facility] ORS;  Service: Cardiovascular;  Laterality: N/A;    Current Outpatient Medications  Medication Sig Dispense Refill  . aspirin 81 MG tablet Take 81 mg by mouth daily. Reported on 06/30/2015    . atorvastatin (LIPITOR) 80 MG tablet Take 1 tablet (80 mg total) by mouth daily. 90 tablet 3  . blood glucose meter kit and supplies KIT Dispense based on patient and insurance preference. Use up to four times daily as directed. (FOR ICD-9 250.00, 250.01). 1 each 0  . carvedilol (COREG) 6.25 MG tablet Take 1 tablet (6.25 mg total) by mouth 2 (two) times daily. 180 tablet 3  . Insulin Glargine (LANTUS SOLOSTAR) 100 UNIT/ML Solostar Pen Inject 15 Units into the skin daily. 100 mL 1  . Insulin Pen Needle 32G X 4 MM MISC 15 Units by Does not apply route daily at 10 pm. 90 each 0  . metFORMIN (GLUCOPHAGE) 500 MG tablet Take 1 tablet (500 mg total) by mouth 2 (two) times daily with a meal. 60 tablet 1  . methimazole (TAPAZOLE) 5 MG tablet Take 2.5 mg by mouth daily.    . Multiple Vitamin (MULTIVITAMIN) tablet Take 1 tablet by mouth daily.    . nicotine (NICOTINE STEP 1) 21 mg/24hr patch Place 1 patch (21 mg total) onto the skin daily. 28 patch 0  . nitroGLYCERIN (NITROSTAT) 0.4 MG SL tablet Place 1 tablet (0.4 mg total) under the tongue every 5 (five) minutes as needed for chest pain. 90 tablet 3  .  sacubitril-valsartan (ENTRESTO) 97-103 MG Take 1 tablet by mouth 2 (two) times daily.    Marland Kitchen spironolactone (ALDACTONE) 25 MG tablet Take 1 tablet (25 mg total) by mouth daily. 30 tablet 6  . ticagrelor (BRILINTA) 90 MG TABS tablet Take 1 tablet (90 mg total) by mouth 2 (two) times daily. 60 tablet 11   No current facility-administered medications for this visit.     Allergies:   Sulfa drugs cross reactors   Social History:  The patient  reports that he has been smoking cigarettes. He has a 20.00 pack-year smoking history. He has never used smokeless tobacco. He reports previous alcohol use. He reports that he does not use drugs.   Family History:   family history includes COPD in his father.    Review of Systems: Review of Systems  Constitutional: Negative.   HENT: Negative.   Respiratory: Negative.   Cardiovascular: Negative.   Gastrointestinal: Negative.   Musculoskeletal: Negative.   Neurological: Negative.   Psychiatric/Behavioral: Negative.   All other systems reviewed and are negative.   PHYSICAL EXAM: VS:  BP 140/78 (BP Location: Left Arm, Patient Position: Sitting, Cuff Size: Normal)   Pulse 72   Ht 5'  9" (1.753 m)   Wt 186 lb (84.4 kg)   BMI 27.47 kg/m  , BMI Body mass index is 27.47 kg/m. Constitutional:  oriented to person, place, and time. No distress.  HENT:  Head: Grossly normal Eyes:  no discharge. No scleral icterus.  Neck: No JVD, no carotid bruits  Cardiovascular: Regular rate and rhythm, no murmurs appreciated Pulmonary/Chest: Clear to auscultation bilaterally, no wheezes or rails Abdominal: Soft.  no distension.  no tenderness.  Musculoskeletal: Normal range of motion Neurological:  normal muscle tone. Coordination normal. No atrophy Skin: Skin warm and dry Psychiatric: normal affect, pleasant  Recent Labs: 11/10/2019: Hemoglobin 15.9; Platelets 177 11/22/2019: BUN 16; Creatinine, Ser 1.10; Potassium 4.4; Sodium 135    Lipid Panel Lab Results   Component Value Date   CHOL 138 11/10/2019   HDL 41 11/10/2019   LDLCALC 76 11/10/2019   TRIG 105 11/10/2019      Wt Readings from Last 3 Encounters:  02/08/20 186 lb (84.4 kg)  01/14/20 187 lb 6 oz (85 kg)  12/09/19 188 lb 8 oz (85.5 kg)     ASSESSMENT AND PLAN:  Coronary artery disease involving native coronary artery of native heart without angina pectoris - Plan: EKG 12-Lead Discussed recent events, STEMI Stent placement RCA Tolerating aspirin Brilinta, statin, carvedilol Long discussion concerning need for smoking cessation  Congestive dilated cardiomyopathy ([place])  Continue Entresto, carvedilol, spironolactone Appears euvolemic, 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 No changes made to the medications.  Pure hypercholesterolemia Cholesterol is at goal on the current lipid regimen. No changes to the medications were made.  Smoking Long discussion with him concerning his need to quit smoking  Poorly controlled type 2 diabetes mellitus ([place]) A1c poorly controlled  Stressed importance of aggressive diabetes control    Total encounter time more than 25 minutes  Greater than 50% was spent in counseling and coordination of care with the patient  No orders of the defined types were placed in this encounter.    Signed, Esmond Plants, M.D., Ph.D. 02/08/2020  [place], [place]

## 2020-02-08 ENCOUNTER — Encounter: Payer: Self-pay | Admitting: Cardiovascular Disease

## 2020-02-08 ENCOUNTER — Ambulatory Visit (INDEPENDENT_AMBULATORY_CARE_PROVIDER_SITE_OTHER): Payer: Medicare HMO | Admitting: Cardiovascular Disease

## 2020-02-08 ENCOUNTER — Other Ambulatory Visit: Payer: Self-pay

## 2020-02-08 VITALS — BP 140/78 | HR 72 | Ht 69.0 in | Wt 186.0 lb

## 2020-02-08 DIAGNOSIS — E785 Hyperlipidemia, unspecified: Secondary | ICD-10-CM

## 2020-02-08 DIAGNOSIS — I10 Essential (primary) hypertension: Secondary | ICD-10-CM

## 2020-02-08 DIAGNOSIS — I255 Ischemic cardiomyopathy: Secondary | ICD-10-CM

## 2020-02-08 DIAGNOSIS — Z794 Long term (current) use of insulin: Secondary | ICD-10-CM

## 2020-02-08 DIAGNOSIS — I5022 Chronic systolic (congestive) heart failure: Secondary | ICD-10-CM

## 2020-02-08 DIAGNOSIS — Z720 Tobacco use: Secondary | ICD-10-CM

## 2020-02-08 DIAGNOSIS — I2510 Atherosclerotic heart disease of native coronary artery without angina pectoris: Secondary | ICD-10-CM

## 2020-02-08 DIAGNOSIS — E119 Type 2 diabetes mellitus without complications: Secondary | ICD-10-CM

## 2020-02-08 MED ORDER — SPIRONOLACTONE 25 MG PO TABS
25.0000 mg | ORAL_TABLET | Freq: Every day | ORAL | 3 refills | Status: DC
Start: 2020-02-08 — End: 2020-05-08

## 2020-02-08 NOTE — Patient Instructions (Signed)
Medication Instructions:  No changes  If you need a refill on your cardiac medications before your next appointment, please call your pharmacy.    Lab work: No new labs needed   If you have labs (blood work) drawn today and your tests are completely normal, you will receive your results only by: . MyChart Message (if you have MyChart) OR . A paper copy in the mail If you have any lab test that is abnormal or we need to change your treatment, we will call you to review the results.   Testing/Procedures: No new testing needed   Follow-Up: At [facility], you and your health needs are our priority.  As part of our continuing mission to provide you with exceptional heart care, we have created designated Provider Care Teams.  These Care Teams include your primary Cardiologist (physician) and Advanced Practice Providers (APPs -  Physician Assistants and Nurse Practitioners) who all work together to provide you with the care you need, when you need it.  . You will need a follow up appointment in 6 months  . Providers on your designated Care Team:   . Christopher Berge, NP . Ryan Dunn, PA-C . [name] Visser, PA-C  Any Other Special Instructions Will Be Listed Below (If Applicable).  COVID-19 Vaccine Information can be found at: [email or web address] For questions related to vaccine distribution or appointments, please email [email or web address] or call [phone].     

## 2020-02-23 ENCOUNTER — Telehealth: Payer: Self-pay | Admitting: Cardiovascular Disease

## 2020-02-23 NOTE — Telephone Encounter (Signed)
Pt c/o medication issue:  1. Name of Medication: brilinta  2. How are you currently taking this medication (dosage and times per day)? 90 mg po BID  3. Are you having a reaction (difficulty breathing--STAT)? no  4. What is your medication issue? Cannot afford $400 refill please call to discuss a cheaper alternative

## 2020-02-25 NOTE — Telephone Encounter (Signed)
Return pt's call regarding Brilinta, pt unavailable, spoke with wife Noralee Chars (DPR approved), explained to wife about the "covergar gap" after you and your drug plan have spent a certain amount of money for covered drugs, you may have to pay all costs out-of-pocket for your prescriptions at the end of year, but at the beginning of the coverage new year, co-pays will go back down to a more affordable rate. Wife Lelon Frohlich verbalized understanding and stated she remember pharmacy talking about this in the past. Wife also asked about pt's [employer] form for [employer], advised wife to fill out his portion, bring by office and we will fill out the provider's portion and have Dr. [name] sign, will be glad to fax for her and pt. Wife agreeable and verbalized understanding.  Otherwise all questions or concerns were address and no additional concerns at this time. Agreeable to plan, will call back for anything further.

## 2020-03-01 NOTE — Telephone Encounter (Signed)
Spoke with patients wife per release form and advised that it has been completed and sent back to company. Application faxed to [employer] and placed in medication files. She verbalized understanding with no further questions at this time.

## 2020-03-01 NOTE — Telephone Encounter (Signed)
Patient dropped off forms  Pam reviewed and has forms at this time

## 2020-03-01 NOTE — Telephone Encounter (Signed)
Application received and will have provider here in office complete.

## 2020-03-09 ENCOUNTER — Telehealth: Payer: Self-pay | Admitting: Family

## 2020-03-09 NOTE — Telephone Encounter (Signed)
Notified patient for second time to come in and sign his [employer]vart[employer] application before it expires. Patient is suppose to come in 03/13/20    Hospital doctor, NT

## 2020-03-23 ENCOUNTER — Other Ambulatory Visit: Payer: TRICARE For Life (TFL)

## 2020-03-23 DIAGNOSIS — Z20822 Contact with and (suspected) exposure to covid-19: Secondary | ICD-10-CM

## 2020-03-25 LAB — SARS-COV-2, NAA 2 DAY TAT

## 2020-03-25 LAB — NOVEL CORONAVIRUS, NAA: SARS-CoV-2, NAA: DETECTED — AB

## 2020-05-08 ENCOUNTER — Other Ambulatory Visit: Payer: Self-pay

## 2020-05-08 MED ORDER — SPIRONOLACTONE 25 MG PO TABS
25.0000 mg | ORAL_TABLET | Freq: Every day | ORAL | 2 refills | Status: DC
Start: 2020-05-08 — End: 2020-08-16

## 2020-05-22 ENCOUNTER — Ambulatory Visit: Payer: Medicare HMO | Admitting: Family

## 2020-05-24 ENCOUNTER — Ambulatory Visit: Payer: TRICARE For Life (TFL) | Admitting: Family

## 2020-05-29 ENCOUNTER — Ambulatory Visit: Payer: TRICARE For Life (TFL) | Admitting: Family

## 2020-06-19 NOTE — Progress Notes (Signed)
Patient ID: Alexander Cobb, male    DOB: Jun 23, 1950, 70 y.o.   MRN: [id number]  HPI  Alexander Cobb is a 70 y/o male with a history of CAD, DM, hyperlipidemia, HTN, obstructive sleep apnea, current tobacco use and chronic heart failure.   Echo report from 11/10/19 reviewed and showed an EF of 40-45%. Echo report from 06/15/19 reviewed and showed an EF of 45-50% along with moderate LVH and trivial Alexander. Echo report from 05/18/2018 reviewed and showed an EF of 35-40% along with trivial Alexander.  Catheterization done 11/09/19 showed:  Prox Cx lesion is 75% stenosed.  Mid Cx lesion is 75% stenosed.  RPDA lesion is 99% stenosed.  Prox RCA lesion is 30% stenosed.  Mid RCA lesion is 30% stenosed.  A drug-eluting stent was successfully placed using a STENT RESOLUTE ONYX 2.25X30.  Post intervention, there is a 0% residual stenosis.  Severely depressed overall left ventricular function around 25 to 30% with inferior akinesis left ventricular enlargement   Conclusion LV function is globally depressed at around 25 to 30% with inferior akinesis Coronaries Long left main relatively free of disease LAD is moderate in size relatively free of disease Circumflex is moderate in size with OM1 with tandem 75% lesions RCA was a large vessel with anterior takeoff with mild to moderate disease proximal to mid but the PDA was diffusely diseased focally of around 99% long lesion TIMI-3 flow Successful PCI and stent of PDA with a 2.25 x 30 mm resolute Onyx reducing the lesion from 99 down to 0%   Has not been admitted or been in the ED in the last 6 months.   He presents today for a follow-up visit with a chief complaint of moderate fatigue upon minimal exertion. Says that this has been chronic in nature having been present for several months. He has associated cough, shortness of breath and occasional palpitations along with this. He denies any difficulty sleeping, dizziness, abdominal distention, pedal edema, chest pain or  weight gain.   He has not taken any of his medications yet today as he was running late so hasn't eaten breakfast yet.    Past Medical History:  Diagnosis Date  . CAD (coronary artery disease)    a. 12/2010 Cath: nonobs dzs; b. 10/2019 STEMI/PCI: LM nl, LAD nl, LCX 75p/m, RCA 30p, 58md, RPDA 99 (2.25x30 Resolute Onyx DES); c. 11/2019 MV: moderate apical inf fixed defect-->diaphragmatic atten/artiact. No ischemia. EF 51%. Low risk.  . Diabetes mellitus without complication ([place]   . HFrEF (heart failure with reduced ejection fraction) ([place]    a. 07/2010 LV gram: EF 33%; b. 05/2018 Echo: EF 30-35%; c. 05/2018 TEE: EF 35-40%; d. 11/2019 Echo: EF 40-45%, gr1 DD. Antlat and inf HK. Nl RV fxn.  .Marland KitchenHTN (hypertension) 12/21/2010  . Hyperlipidemia   . Mixed Ischemic and non-ischemic cardiomyopathy    a. 07/2010 LV gram: EF 33%; b. 05/2018 Echo: EF 30-35%; c. 05/2018 TEE: EF 35-40%; d. 11/2019 Echo: EF 40-45%.  . OSA (obstructive sleep apnea)    Uses CPAP at home  . PSVT (paroxysmal supraventricular tachycardia) ([place] 12/21/2010  . Tobacco abuse    Past Surgical History:  Procedure Laterality Date  . CARDIAC CATHETERIZATION    . COLONOSCOPY WITH PROPOFOL N/A 11/07/2015   Procedure: COLONOSCOPY WITH PROPOFOL;  Surgeon: MLollie Sails MD;  [facility];  Service: Endoscopy;  Laterality: N/A;  . CORONARY/GRAFT ACUTE MI REVASCULARIZATION N/A 11/09/2019   Procedure: Coronary/Graft Acute MI Revascularization;  Surgeon:  Callwood, Dwayne D, MD;  Location: [facility] INVASIVE CV LAB;  Service: Cardiovascular;  Laterality: N/A;  . LEFT HEART CATH AND CORONARY ANGIOGRAPHY N/A 11/09/2019   Procedure: LEFT HEART CATH AND CORONARY ANGIOGRAPHY;  Surgeon: Callwood, Dwayne D, MD;  Location: [facility] INVASIVE CV LAB;  Service: Cardiovascular;  Laterality: N/A;  . TEE WITHOUT CARDIOVERSION N/A 05/18/2018   Procedure: TRANSESOPHAGEAL ECHOCARDIOGRAM (TEE);  Surgeon: Arida, Muhammad A, MD;  Location: [facility] ORS;  Service:  Cardiovascular;  Laterality: N/A;   Family History  Problem Relation Age of Onset  . COPD Father    Social History   Tobacco Use  . Smoking status: Current Every Day Smoker    Packs/day: 0.50    Years: 40.00    Pack years: 20.00    Types: Cigarettes  . Smokeless tobacco: Never Used  Substance Use Topics  . Alcohol use: Not Currently   Allergies  Allergen Reactions  . Sulfa Drugs Cross Reactors Hives    Hives    Prior to Admission medications   Medication Sig Start Date End Date Taking? Authorizing Provider  atorvastatin (LIPITOR) 80 MG tablet Take 1 tablet (80 mg total) by mouth daily. 11/12/19  Yes Berge, Christopher Ronald, NP  blood glucose meter kit and supplies KIT Dispense based on patient and insurance preference. Use up to four times daily as directed. (FOR ICD-9 250.00, 250.01). 05/20/18  Yes Gouru, Aruna, MD  carvedilol (COREG) 6.25 MG tablet Take 1 tablet (6.25 mg total) by mouth 2 (two) times daily. 11/12/19  Yes Berge, Christopher Ronald, NP  Insulin Glargine (LANTUS SOLOSTAR) 100 UNIT/ML Solostar Pen Inject 15 Units into the skin daily. 05/20/18  Yes Gouru, Aruna, MD  Insulin Pen Needle 32G X 4 MM MISC 15 Units by Does not apply route daily at 10 pm. 05/20/18  Yes Gouru, Aruna, MD  metFORMIN (GLUCOPHAGE) 500 MG tablet Take 1 tablet (500 mg total) by mouth 2 (two) times daily with a meal. 05/20/18  Yes Gouru, Aruna, MD  Multiple Vitamin (MULTIVITAMIN) tablet Take 1 tablet by mouth daily.   Yes [provider]  nicotine (NICOTINE STEP 1) 21 mg/24hr patch Place 1 patch (21 mg total) onto the skin daily. 11/23/19  Yes Visser, [name] D, PA-C  nitroGLYCERIN (NITROSTAT) 0.4 MG SL tablet Place 1 tablet (0.4 mg total) under the tongue every 5 (five) minutes as needed for chest pain. 04/15/19 11/22/28 Yes Hackney, Tina A, FNP  sacubitril-valsartan (ENTRESTO) 97-103 MG Take 1 tablet by mouth 2 (two) times daily.   Yes [provider]  spironolactone (ALDACTONE) 25 MG  tablet Take 1 tablet (25 mg total) by mouth daily. 05/08/20  Yes Gollan, Timothy J, MD  ticagrelor (BRILINTA) 90 MG TABS tablet Take 1 tablet (90 mg total) by mouth 2 (two) times daily. 12/09/19  Yes Berge, Christopher Ronald, NP  aspirin 81 MG tablet Take 81 mg by mouth daily. Reported on 06/30/2015 Patient not taking: Reported on 06/20/2020    [provider]  methimazole (TAPAZOLE) 5 MG tablet Take 2.5 mg by mouth daily. Patient not taking: Reported on 06/20/2020 08/26/19   [provider]    Review of Systems  Constitutional: Positive for fatigue (easily). Negative for appetite change.  HENT: Positive for rhinorrhea. Negative for congestion and sore throat.   Eyes: Negative.   Respiratory: Positive for cough (productive) and shortness of breath (with moderate exertion).   Cardiovascular: Positive for palpitations. Negative for chest pain and leg swelling.  Gastrointestinal: Negative for abdominal distention and abdominal   pain.  Endocrine: Negative.   Genitourinary: Negative.   Musculoskeletal: Negative for back pain and neck pain.  Skin: Negative.   Allergic/Immunologic: Negative.   Neurological: Negative for dizziness and light-headedness.  Hematological: Negative for adenopathy. Does not bruise/bleed easily.  Psychiatric/Behavioral: Negative for dysphoric mood and sleep disturbance (CPAP with 1 pillow under head). The patient is not nervous/anxious.    Vitals:   06/20/20 0844  BP: (!) 165/79  Pulse: 62  Resp: 20  SpO2: 99%  Weight: 183 lb 6 oz (83.2 kg)  Height: 5' 9" (1.753 m)   Wt Readings from Last 3 Encounters:  06/20/20 183 lb 6 oz (83.2 kg)  02/08/20 186 lb (84.4 kg)  01/14/20 187 lb 6 oz (85 kg)   Lab Results  Component Value Date   CREATININE 1.10 11/22/2019   CREATININE 1.29 (H) 11/12/2019   CREATININE 1.46 (H) 11/10/2019    Physical Exam Vitals and nursing note reviewed.  Constitutional:      Appearance: Normal appearance.  HENT:     Head:  Normocephalic and atraumatic.  Cardiovascular:     Rate and Rhythm: Normal rate and regular rhythm.  Pulmonary:     Effort: Pulmonary effort is normal. No respiratory distress.     Breath sounds: No wheezing or rales.  Abdominal:     General: There is no distension.     Palpations: Abdomen is soft.  Musculoskeletal:        General: No tenderness.     Cervical back: Normal range of motion and neck supple.     Right lower leg: No edema.     Left lower leg: No edema.  Skin:    General: Skin is dry.  Neurological:     General: No focal deficit present.     Mental Status: He is alert and oriented to person, place, and time.  Psychiatric:        Mood and Affect: Mood normal.        Behavior: Behavior normal.        Thought Content: Thought content normal.    Assessment & Plan:  1: Chronic heart failure with reduced ejection fraction- - NYHA class III - euvolemic today - weighing daily; reminded to call for an overnight weight gain of >2 pounds or a weekly weight gain of >5 pounds - weight down 4 pounds from last visit 7 months ago - not adding salt and tries to eat low sodium foods; reviewed the importance of closely following a low sodium diet  - saw cardiology (Gollan) 02/08/20; returns ~ 6 months - stress test completed 12/08/19  2: HTN- - BP elevated today but he hasn't taken any of his medications yet today; encouraged him to take them prior to coming so that efficacy can be assessed - saw PCP (Anderson) 05/29/20; BP looked good that day (137/72) - BMP from 05/22/20 reviewed and showed sodium 139, potassium 4.3, creatinine 1.2 and GFR 60  3: DM- - A1c 05/22/20 was 8.4% - saw endocrinology (O'Connell) 09/09/2018  4: Tobacco use- - smoking 1/2 ppd  - complete cessation discussed for 3 minutes with him   Medication bottles were reviewed.   Offered to have him return as needed but he prefers to make another appointment for 6 months from now. Advised to call back sooner for  any questions/issues prior to that.                     

## 2020-06-20 ENCOUNTER — Other Ambulatory Visit: Payer: Self-pay

## 2020-06-20 ENCOUNTER — Encounter: Payer: Self-pay | Admitting: Family

## 2020-06-20 ENCOUNTER — Ambulatory Visit: Payer: Medicare HMO | Attending: Family | Admitting: Family

## 2020-06-20 VITALS — BP 165/79 | HR 62 | Resp 20 | Ht 69.0 in | Wt 183.4 lb

## 2020-06-20 DIAGNOSIS — Z7984 Long term (current) use of oral hypoglycemic drugs: Secondary | ICD-10-CM | POA: Insufficient documentation

## 2020-06-20 DIAGNOSIS — Z7901 Long term (current) use of anticoagulants: Secondary | ICD-10-CM | POA: Diagnosis not present

## 2020-06-20 DIAGNOSIS — I252 Old myocardial infarction: Secondary | ICD-10-CM | POA: Diagnosis not present

## 2020-06-20 DIAGNOSIS — R0602 Shortness of breath: Secondary | ICD-10-CM | POA: Insufficient documentation

## 2020-06-20 DIAGNOSIS — Z794 Long term (current) use of insulin: Secondary | ICD-10-CM | POA: Insufficient documentation

## 2020-06-20 DIAGNOSIS — I2510 Atherosclerotic heart disease of native coronary artery without angina pectoris: Secondary | ICD-10-CM | POA: Insufficient documentation

## 2020-06-20 DIAGNOSIS — F17210 Nicotine dependence, cigarettes, uncomplicated: Secondary | ICD-10-CM | POA: Insufficient documentation

## 2020-06-20 DIAGNOSIS — Z720 Tobacco use: Secondary | ICD-10-CM

## 2020-06-20 DIAGNOSIS — I5022 Chronic systolic (congestive) heart failure: Secondary | ICD-10-CM | POA: Insufficient documentation

## 2020-06-20 DIAGNOSIS — Z955 Presence of coronary angioplasty implant and graft: Secondary | ICD-10-CM | POA: Insufficient documentation

## 2020-06-20 DIAGNOSIS — Z7982 Long term (current) use of aspirin: Secondary | ICD-10-CM | POA: Diagnosis not present

## 2020-06-20 DIAGNOSIS — R002 Palpitations: Secondary | ICD-10-CM | POA: Insufficient documentation

## 2020-06-20 DIAGNOSIS — E119 Type 2 diabetes mellitus without complications: Secondary | ICD-10-CM | POA: Insufficient documentation

## 2020-06-20 DIAGNOSIS — R059 Cough, unspecified: Secondary | ICD-10-CM | POA: Insufficient documentation

## 2020-06-20 DIAGNOSIS — I110 Hypertensive heart disease with heart failure: Secondary | ICD-10-CM | POA: Diagnosis not present

## 2020-06-20 DIAGNOSIS — G4733 Obstructive sleep apnea (adult) (pediatric): Secondary | ICD-10-CM | POA: Insufficient documentation

## 2020-06-20 DIAGNOSIS — I10 Essential (primary) hypertension: Secondary | ICD-10-CM

## 2020-06-20 NOTE — Patient Instructions (Signed)
Continue weighing daily and call for an overnight weight gain of > 2 pounds or a weekly weight gain of >5 pounds. 

## 2020-08-12 ENCOUNTER — Other Ambulatory Visit: Payer: Self-pay | Admitting: Cardiovascular Disease

## 2020-08-14 NOTE — Telephone Encounter (Signed)
Please schedule 6 month F/U appointment. Thank you! 

## 2020-08-14 NOTE — Telephone Encounter (Signed)
Unable to LVM.

## 2020-08-30 NOTE — Telephone Encounter (Signed)
Attempted to schedule no ans no vm  

## 2020-09-13 NOTE — Telephone Encounter (Signed)
Attempted to schedule .  No ans no vm.   Unable to reach after multiple attempts .  Closing referral.

## 2020-09-18 ENCOUNTER — Telehealth: Payer: Self-pay | Admitting: Cardiovascular Disease

## 2020-09-18 ENCOUNTER — Other Ambulatory Visit: Payer: Self-pay | Admitting: Cardiovascular Disease

## 2020-09-18 NOTE — Telephone Encounter (Signed)
-----   Message from Anselm Pancoast, [place] sent at 09/18/2020  2:16 PM EDT ----- Please contact the patient for a follow up with Dr. [name]. The patient is due for a past due follow up.  Thanks, Ivin Booty

## 2020-09-18 NOTE — Telephone Encounter (Signed)
Medication Refill (Newest Message First) You  [facility] Refills 5 days ago   [facility]  Attempted to schedule .  No ans no vm.   Unable to reach after multiple attempts .  Closing referral.      Note    You 2 weeks ago   [facility]  Attempted to schedule no ans no vm        Note    Bumgarner, Taylor R  [facility] Scheduling 1 month ago   [facility]  Unable to LVM      Note    Newcomer McClain, Brandy L  [facility] Scheduling 1 month ago     Please schedule 6 month F/U appointment. Thank you!      Note

## 2020-10-22 ENCOUNTER — Other Ambulatory Visit: Payer: Self-pay | Admitting: Cardiovascular Disease

## 2020-11-06 ENCOUNTER — Other Ambulatory Visit: Payer: Self-pay | Admitting: Cardiovascular Disease

## 2020-11-30 ENCOUNTER — Other Ambulatory Visit: Payer: Self-pay | Admitting: Nurse Practitioner

## 2020-11-30 NOTE — Telephone Encounter (Signed)
Please contact pt for future appointment. Pt is overdue for 6 month f/u. Pt needing refills.

## 2020-11-30 NOTE — Telephone Encounter (Signed)
LMOV  

## 2020-11-30 NOTE — Telephone Encounter (Signed)
This is a [place] pt, Dr. Gollan 

## 2020-12-20 ENCOUNTER — Encounter: Payer: Self-pay | Admitting: Family

## 2020-12-20 ENCOUNTER — Other Ambulatory Visit: Payer: Self-pay

## 2020-12-20 ENCOUNTER — Ambulatory Visit: Payer: Medicare HMO | Attending: Family | Admitting: Family

## 2020-12-20 VITALS — BP 163/74 | HR 68 | Resp 18 | Ht 69.0 in | Wt 181.0 lb

## 2020-12-20 DIAGNOSIS — Z882 Allergy status to sulfonamides status: Secondary | ICD-10-CM | POA: Diagnosis not present

## 2020-12-20 DIAGNOSIS — F17210 Nicotine dependence, cigarettes, uncomplicated: Secondary | ICD-10-CM | POA: Insufficient documentation

## 2020-12-20 DIAGNOSIS — Z7984 Long term (current) use of oral hypoglycemic drugs: Secondary | ICD-10-CM | POA: Insufficient documentation

## 2020-12-20 DIAGNOSIS — I2510 Atherosclerotic heart disease of native coronary artery without angina pectoris: Secondary | ICD-10-CM | POA: Diagnosis not present

## 2020-12-20 DIAGNOSIS — E785 Hyperlipidemia, unspecified: Secondary | ICD-10-CM | POA: Diagnosis not present

## 2020-12-20 DIAGNOSIS — R42 Dizziness and giddiness: Secondary | ICD-10-CM | POA: Insufficient documentation

## 2020-12-20 DIAGNOSIS — I10 Essential (primary) hypertension: Secondary | ICD-10-CM

## 2020-12-20 DIAGNOSIS — Z955 Presence of coronary angioplasty implant and graft: Secondary | ICD-10-CM | POA: Insufficient documentation

## 2020-12-20 DIAGNOSIS — Z79899 Other long term (current) drug therapy: Secondary | ICD-10-CM | POA: Insufficient documentation

## 2020-12-20 DIAGNOSIS — Z794 Long term (current) use of insulin: Secondary | ICD-10-CM | POA: Diagnosis not present

## 2020-12-20 DIAGNOSIS — Z7982 Long term (current) use of aspirin: Secondary | ICD-10-CM | POA: Diagnosis not present

## 2020-12-20 DIAGNOSIS — G4733 Obstructive sleep apnea (adult) (pediatric): Secondary | ICD-10-CM | POA: Diagnosis not present

## 2020-12-20 DIAGNOSIS — E119 Type 2 diabetes mellitus without complications: Secondary | ICD-10-CM | POA: Diagnosis not present

## 2020-12-20 DIAGNOSIS — I5022 Chronic systolic (congestive) heart failure: Secondary | ICD-10-CM | POA: Diagnosis not present

## 2020-12-20 DIAGNOSIS — Z720 Tobacco use: Secondary | ICD-10-CM | POA: Diagnosis not present

## 2020-12-20 DIAGNOSIS — I110 Hypertensive heart disease with heart failure: Secondary | ICD-10-CM | POA: Diagnosis not present

## 2020-12-20 MED ORDER — DAPAGLIFLOZIN PROPANEDIOL 10 MG PO TABS
10.0000 mg | ORAL_TABLET | Freq: Every day | ORAL | 5 refills | Status: DC
Start: 2020-12-20 — End: 2021-07-16

## 2020-12-20 NOTE — Progress Notes (Signed)
Patient ID: Alexander Cobb, male    DOB: 1950/06/01, 70 y.o.   MRN: [id number]   Alexander Cobb is a 70 y/o male with a history of CAD, DM, hyperlipidemia, HTN, obstructive sleep apnea, current tobacco use and chronic heart failure.   Echo report from 11/10/19 reviewed and showed an EF of 40-45%. Echo report from 06/15/19 reviewed and showed an EF of 45-50% along with moderate LVH and trivial Alexander. Echo report from 05/18/2018 reviewed and showed an EF of 35-40% along with trivial Alexander.  Catheterization done 11/09/19 showed: Prox Cx lesion is 75% stenosed. Mid Cx lesion is 75% stenosed. RPDA lesion is 99% stenosed. Prox RCA lesion is 30% stenosed. Mid RCA lesion is 30% stenosed. A drug-eluting stent was successfully placed using a STENT RESOLUTE ONYX 2.25X30. Post intervention, there is a 0% residual stenosis. Severely depressed overall left ventricular function around 25 to 30% with inferior akinesis left ventricular enlargement   Conclusion LV function is globally depressed at around 25 to 30% with inferior akinesis Coronaries Long left main relatively free of disease LAD is moderate in size relatively free of disease Circumflex is moderate in size with OM1 with tandem 75% lesions RCA was a large vessel with anterior takeoff with mild to moderate disease proximal to mid but the PDA was diffusely diseased focally of around 99% long lesion TIMI-3 flow Successful PCI and stent of PDA with a 2.25 x 30 mm resolute Onyx reducing the lesion from 99 down to 0%   Has not been admitted or been in the ED in the last 6 months.   He presents today for a follow-up visit with a chief complaint of minimum fatigue upon minimum exertion. Says that this has been chronic in nature and unchanged over the last year. He has occasional palpitations, SOB and cough--states from smoking. He denies any difficulty sleeping, dizziness, abdominal distention, pedal edema, chest pain or weight gain.   He has not taken any of his  medications yet today as he was running late so hasn't eaten breakfast yet.   Past Medical History:  Diagnosis Date   CAD (coronary artery disease)    a. 12/2010 Cath: nonobs dzs; b. 10/2019 STEMI/PCI: LM nl, LAD nl, LCX 75p/m, RCA 30p, 47md, RPDA 99 (2.25x30 Resolute Onyx DES); c. 11/2019 MV: moderate apical inf fixed defect-->diaphragmatic atten/artiact. No ischemia. EF 51%. Low risk.   [place]    HFrEF (heart failure with reduced ejection fraction) ([place]    a. 07/2010 LV gram: EF 33%; b. 05/2018 Echo: EF 30-35%; c. 05/2018 TEE: EF 35-40%; d. 11/2019 Echo: EF 40-45%, gr1 DD. Antlat and inf HK. Nl RV fxn.   HTN (hypertension) 12/21/2010   Hyperlipidemia    Mixed Ischemic and non-ischemic cardiomyopathy    a. 07/2010 LV gram: EF 33%; b. 05/2018 Echo: EF 30-35%; c. 05/2018 TEE: EF 35-40%; d. 11/2019 Echo: EF 40-45%.   OSA (obstructive sleep apnea)    Uses CPAP at home   PSVT ([place]oxys[place]ricular [place]) [place] 12/[place]   Tobacco abuse    Past Surgical History:  Procedure Laterality Date   CARDIAC CATHETERIZATION     COLONOSCOPY WITH PROPOFOL N/A 11/07/2015   Procedure: COLONOSCOPY WITH PROPOFOL;  Surgeon: MLollie Sails MD;  [facility];  Service: Endoscopy;  Laterality: N/A;   CORONARY/GRAFT ACUTE MI REVASCULARIZATION N/A 11/09/2019   Procedure: Coronary/Graft Acute MI Revascularization;  Surgeon: CYolonda Kida MD;  [facility];  Service: Cardiovascular;  Laterality: N/A;   LEFT HEART CATH AND CORONARY ANGIOGRAPHY N/A 11/09/2019   Procedure: LEFT HEART CATH AND CORONARY ANGIOGRAPHY;  Surgeon: Yolonda Kida, MD;  Location: [place] [facility];  Service: Cardiovascular;  Laterality: N/A;   TEE WITHOUT CARDIOVERSION N/A 05/18/2018   Procedure: TRANSESOPHAGEAL ECHOCARDIOGRAM (TEE);  Surgeon: Wellington Hampshire, MD;  Location: [facility] ORS;  Service: Cardiovascular;  Laterality: N/A;   Family History  Problem  Relation Age of Onset   COPD Father    Social History   Tobacco Use   Smoking status: Every Day    Packs/day: 0.50    Years: 40.00    Pack years: 20.00    Types: Cigarettes   Smokeless tobacco: Never  Substance Use Topics   Alcohol use: Not Currently   Allergies  Allergen Reactions   Sulfa Drugs Cross Reactors Hives    Hives    Prior to Admission medications   Medication Sig Start Date End Date Taking? Authorizing Provider  aspirin 81 MG tablet Take 81 mg by mouth daily. Reported on 06/30/2015   Yes [provider]  atorvastatin (LIPITOR) 80 MG tablet TAKE 1 TABLET BY MOUTH EVERY DAY 12/01/20  Yes Gollan, Kathlene November, MD  blood glucose meter kit and supplies KIT Dispense based on patient and insurance preference. Use up to four times daily as directed. (FOR ICD-9 250.00, 250.01). 05/20/18  Yes Gouru, Aruna, MD  carvedilol (COREG) 6.25 MG tablet TAKE 1 TABLET BY MOUTH TWICE A DAY 12/01/20  Yes Gollan, Kathlene November, MD  Insulin Glargine (LANTUS SOLOSTAR) 100 UNIT/ML Solostar Pen Inject 15 Units into the skin daily. 05/20/18  Yes Gouru, Illene Silver, MD  Insulin Pen Needle 32G X 4 MM MISC 15 Units by Does not apply route daily at 10 pm. 05/20/18  Yes Gouru, Illene Silver, MD  metFORMIN (GLUCOPHAGE) 500 MG tablet Take 1 tablet (500 mg total) by mouth 2 (two) times daily with a meal. 05/20/18  Yes Gouru, Aruna, MD  Multiple Vitamin (MULTIVITAMIN) tablet Take 1 tablet by mouth daily.   Yes [provider]  nicotine (NICOTINE STEP 1) 21 mg/24hr patch Place 1 patch (21 mg total) onto the skin daily. 11/23/19  Yes Visser, [name] D, PA-C  nitroGLYCERIN (NITROSTAT) 0.4 MG SL tablet Place 1 tablet (0.4 mg total) under the tongue every 5 (five) minutes as needed for chest pain. 04/15/19 11/22/28 Yes Hackney, Tina A, FNP  sacubitril-valsartan (ENTRESTO) 97-103 MG Take 1 tablet by mouth 2 (two) times daily.   Yes [provider]  spironolactone (ALDACTONE) 25 MG tablet Take 1 tablet (25 mg total)  by mouth daily.  10/23/20  Yes Minna Merritts, MD  ticagrelor (BRILINTA) 90 MG TABS tablet Take 1 tablet (90 mg total) by mouth 2 (two) times daily. 12/09/19  Yes Theora Gianotti, NP  methimazole (TAPAZOLE) 5 MG tablet Take 2.5 mg by mouth daily. Patient not taking: No sig reported 08/26/19   [provider]    Review of Systems  Constitutional:  Positive for fatigue (with moderate exertion). Negative for appetite change.  HENT:  Negative for congestion, ear pain, hearing loss, rhinorrhea, sore throat and tinnitus.   Eyes: Negative.  Negative for visual disturbance.  Respiratory:  Positive for cough (productive, associated with smoking) and shortness of breath (associated with smoking).   Cardiovascular:  Positive for palpitations (on occasion). Negative for chest pain and leg swelling.  Gastrointestinal:  Negative for abdominal distention and abdominal pain.  Endocrine: Negative.   Genitourinary: Negative.  Musculoskeletal:  Negative for back pain and neck pain.  Skin: Negative.   Allergic/Immunologic: Negative.   Neurological:  Negative for dizziness, syncope, weakness, light-headedness and headaches.  Hematological:  Negative for adenopathy. Does not bruise/bleed easily.  Psychiatric/Behavioral:  Negative for dysphoric mood and sleep disturbance (CPAP with 1 pillow under head). The patient is not nervous/anxious.    Vitals:   12/20/20 0820  BP: (!) 163/74  Pulse: 68  Resp: 18  SpO2: 100%  Weight: 181 lb (82.1 kg)  Height: _0  (1.753 m)   Wt Readings from Last 3 Encounters:  12/20/20 181 lb (82.1 kg)  06/20/20 183 lb 6 oz (83.2 kg)  02/08/20 186 lb (84.4 kg)   Lab Results  Component Value Date   CREATININE 1.10 11/22/2019   CREATININE 1.29 (H) 11/12/2019   CREATININE 1.46 (H) 11/10/2019    Physical Exam Vitals and nursing note reviewed.  Constitutional:      Appearance: Normal appearance.  HENT:     Head: Normocephalic and atraumatic.     Right  Ear: External ear normal.     Left Ear: External ear normal.  Neck:     Comments: - JVD Cardiovascular:     Rate and Rhythm: Normal rate and regular rhythm.     Pulses: Normal pulses.     Heart sounds: Normal heart sounds. No murmur heard.   No gallop.  Pulmonary:     Effort: Pulmonary effort is normal. No respiratory distress.     Breath sounds: Normal breath sounds. No wheezing or rales.  Abdominal:     General: There is no distension.     Palpations: Abdomen is soft.     Tenderness: There is no abdominal tenderness.  Musculoskeletal:        General: No tenderness.     Cervical back: Normal range of motion and neck supple.     Right lower leg: No edema.     Left lower leg: No edema.  Skin:    General: Skin is dry.     Findings: Bruising and lesion present.  Neurological:     General: No focal deficit present.     Mental Status: He is alert and oriented to person, place, and time.  Psychiatric:        Mood and Affect: Mood normal.        Behavior: Behavior normal.        Thought Content: Thought content normal.   Assessment & Plan:  1: Chronic heart failure with reduced ejection fraction- - NYHA class II - euvolemic today - weighing daily; reminded to call for an overnight weight gain of >2 pounds or a weekly weight gain of >5 pounds. States he does not weigh himself consistently and generally goes by how he feels.  - weight down 2 pounds from last visit in April  - not adding salt and tries to eat low sodium foods; reviewed the importance of closely following a low sodium diet  - saw cardiology [name]) 7/22; needs to call to schedule f/u - on GDMT of carvedilol, Entresto, and spironolactone - will start Farxiga 10 mg QD, coupon and patient assistance forms provided and filled out  2: HTN- - BP 163/74 elevated today but he hasn't taken any of his medications yet today; encouraged him to take them prior to coming so that efficacy can be assessed. Denies HA, blurred  vision.  - saw PCP in August - BMP from 8/22 reviewed and showed sodium 134, potassium 4.6, creatinine  1.2 and GFR 60  3: DM- - A1c 8/22 was 9%, up from previous 8.4%  4: Tobacco use- - smoking, cannot quantify but states he has not cut down any - complete cessation discussed for 3 minutes with him  5: Vertigo- - pt reports on occasion he "walks into a wall" suddenly at work for "a while"  - denies dizziness, syncope, or pre-syncope - does not correlate with hunger or hypoglycemic symptoms  - not associated with sudden positional changes - no blurred vision, vision changes (saw opthamologist in September) or headaches - encouraged to f/u with PCP if it continues to be bothersome   Patient did not bring his medication bottles for review. Reviewed medication list, he advises he is taking all as prescribed.    Return in 4 weeks for BMP and re-evaluate after starting [place].

## 2020-12-20 NOTE — Patient Instructions (Signed)
Start taking farxiga 10mg  once a day

## 2021-01-18 ENCOUNTER — Encounter: Payer: Self-pay | Admitting: Family

## 2021-01-18 ENCOUNTER — Ambulatory Visit (HOSPITAL_BASED_OUTPATIENT_CLINIC_OR_DEPARTMENT_OTHER): Payer: Medicare HMO | Admitting: Family

## 2021-01-18 ENCOUNTER — Other Ambulatory Visit
Admission: RE | Admit: 2021-01-18 | Discharge: 2021-01-18 | Disposition: A | Discharge status: Home / Self Care | Payer: Medicare HMO | Source: Ambulatory Visit | Attending: Family | Admitting: Family

## 2021-01-18 ENCOUNTER — Other Ambulatory Visit: Payer: Self-pay

## 2021-01-18 VITALS — BP 156/70 | HR 76 | Resp 20 | Ht 69.0 in | Wt 179.1 lb

## 2021-01-18 DIAGNOSIS — I110 Hypertensive heart disease with heart failure: Secondary | ICD-10-CM | POA: Insufficient documentation

## 2021-01-18 DIAGNOSIS — Z7984 Long term (current) use of oral hypoglycemic drugs: Secondary | ICD-10-CM | POA: Insufficient documentation

## 2021-01-18 DIAGNOSIS — I5022 Chronic systolic (congestive) heart failure: Secondary | ICD-10-CM

## 2021-01-18 DIAGNOSIS — Z79899 Other long term (current) drug therapy: Secondary | ICD-10-CM | POA: Insufficient documentation

## 2021-01-18 DIAGNOSIS — Z720 Tobacco use: Secondary | ICD-10-CM | POA: Diagnosis not present

## 2021-01-18 DIAGNOSIS — I10 Essential (primary) hypertension: Secondary | ICD-10-CM

## 2021-01-18 DIAGNOSIS — F17210 Nicotine dependence, cigarettes, uncomplicated: Secondary | ICD-10-CM | POA: Insufficient documentation

## 2021-01-18 DIAGNOSIS — Z716 Tobacco abuse counseling: Secondary | ICD-10-CM | POA: Insufficient documentation

## 2021-01-18 DIAGNOSIS — E119 Type 2 diabetes mellitus without complications: Secondary | ICD-10-CM | POA: Insufficient documentation

## 2021-01-18 DIAGNOSIS — G4733 Obstructive sleep apnea (adult) (pediatric): Secondary | ICD-10-CM | POA: Insufficient documentation

## 2021-01-18 DIAGNOSIS — I2510 Atherosclerotic heart disease of native coronary artery without angina pectoris: Secondary | ICD-10-CM | POA: Insufficient documentation

## 2021-01-18 DIAGNOSIS — E785 Hyperlipidemia, unspecified: Secondary | ICD-10-CM | POA: Insufficient documentation

## 2021-01-18 DIAGNOSIS — Z794 Long term (current) use of insulin: Secondary | ICD-10-CM

## 2021-01-18 LAB — BASIC METABOLIC PANEL
Anion gap: 6 (ref 5–15)
BUN: 24 mg/dL — ABNORMAL HIGH (ref 8–23)
CO2: 25 mmol/L (ref 22–32)
Calcium: 9.3 mg/dL (ref 8.9–10.3)
Chloride: 104 mmol/L (ref 98–111)
Creatinine, Ser: 1.23 mg/dL (ref 0.61–1.24)
GFR, Estimated: >60 mL/min (ref >60)
Glucose, Bld: 187 mg/dL — ABNORMAL HIGH (ref 70–99)
Potassium: 3.8 mmol/L (ref 3.5–5.1)
Sodium: 135 mmol/L (ref 135–145)

## 2021-01-18 NOTE — Progress Notes (Signed)
Patient ID: Alexander Cobb, male    DOB: 03/14/1950, 70 y.o.   MRN: [id number]   Alexander Cobb is a 70 y/o male with a history of CAD, DM, hyperlipidemia, HTN, obstructive sleep apnea, current tobacco use and chronic heart failure.   Echo report from 11/10/19 reviewed and showed an EF of 40-45%. Echo report from 06/15/19 reviewed and showed an EF of 45-50% along with moderate LVH and trivial Alexander. Echo report from 05/18/2018 reviewed and showed an EF of 35-40% along with trivial Alexander.  Catheterization done 11/09/19 showed: Prox Cx lesion is 75% stenosed. Mid Cx lesion is 75% stenosed. RPDA lesion is 99% stenosed. Prox RCA lesion is 30% stenosed. Mid RCA lesion is 30% stenosed. A drug-eluting stent was successfully placed using a STENT RESOLUTE ONYX 2.25X30. Post intervention, there is a 0% residual stenosis. Severely depressed overall left ventricular function around 25 to 30% with inferior akinesis left ventricular enlargement   Conclusion LV function is globally depressed at around 25 to 30% with inferior akinesis Coronaries Long left main relatively free of disease LAD is moderate in size relatively free of disease Circumflex is moderate in size with OM1 with tandem 75% lesions RCA was a large vessel with anterior takeoff with mild to moderate disease proximal to mid but the PDA was diffusely diseased focally of around 99% long lesion TIMI-3 flow Successful PCI and stent of PDA with a 2.25 x 30 mm resolute Onyx reducing the lesion from 99 down to 0%   Has not been admitted or been in the ED in the last 6 months.   Alexander Cobb presents today for a follow-up visit with a chief complaint of minimal fatigue upon moderate exertion. Alexander Cobb describes this as chronic in nature having been present for several years. Alexander Cobb has associated cough, shortness of breath and palpitations along with this. Alexander Cobb denies any difficulty sleeping, dizziness, abdominal distention, pedal edema, chest pain or weight gain.   Says that Alexander Cobb got  aggravated driving from work to the appointment today [place]. Has a BP cuff at home but neither Alexander Cobb nor Alexander Cobb wife know how to operate it.   Has tolerated farxiga without known side effects. Alexander Cobb says Alexander Cobb copay will be ~$45 and Alexander Cobb did not get approved for patient assistance.   Past Medical History:  Diagnosis Date   CAD (coronary artery disease)    a. 12/2010 Cath: nonobs dzs; b. 10/2019 STEMI/PCI: LM nl, LAD nl, LCX 75p/m, RCA 30p, 33md, RPDA 99 (2.25x30 Resolute Onyx DES); c. 11/2019 MV: moderate apical inf fixed defect-->diaphragmatic atten/artiact. No ischemia. EF 51%. Low risk.   Diabetes mellitus without complication ([place]    HFrEF (heart failure with reduced ejection fraction) [place]    a. 07/2010 LV gram: EF 33%; b. 05/2018 Echo: EF 30-35%; c. 05/2018 TEE: EF 35-40%; d. 11/2019 Echo: EF 40-45%, gr1 DD. Antlat and inf HK. Nl RV fxn.   HTN (hypertension) 12/21/2010   Hyperlipidemia    Mixed Ischemic and non-ischemic cardiomyopathy    a. 07/2010 LV gram: EF 33%; b. 05/2018 Echo: EF 30-35%; c. 05/2018 TEE: EF 35-40%; d. 11/2019 Echo: EF 40-45%.   OSA (obstructive sleep apnea)    Uses CPAP at home   PSVT ([place]oxysmal [place]ricular tachycardia) [place] 12/21/2010   Tobacco abuse    Past Surgical History:  Procedure Laterality Date   CARDIAC CATHETERIZATION     COLONOSCOPY WITH PROPOFOL N/A 11/07/2015   Procedure: COLONOSCOPY WITH PROPOFOL;  Surgeon: MLollie Sails  MD;  Location: [facility] ENDOSCOPY;  Service: Endoscopy;  Laterality: N/A;   CORONARY/GRAFT ACUTE MI REVASCULARIZATION N/A 11/09/2019   Procedure: Coronary/Graft Acute MI Revascularization;  Surgeon: Yolonda Kida, MD;  Location: [place] [facility];  Service: Cardiovascular;  Laterality: N/A;   LEFT HEART CATH AND CORONARY ANGIOGRAPHY N/A 11/09/2019   Procedure: LEFT HEART CATH AND CORONARY ANGIOGRAPHY;  Surgeon: Yolonda Kida, MD;  Location: [place] [facility];  Service: Cardiovascular;  Laterality:  N/A;   TEE WITHOUT CARDIOVERSION N/A 05/18/2018   Procedure: TRANSESOPHAGEAL ECHOCARDIOGRAM (TEE);  Surgeon: Wellington Hampshire, MD;  Location: [facility] ORS;  Service: Cardiovascular;  Laterality: N/A;   Family History  Problem Relation Age of Onset   COPD Father    Social History   Tobacco Use   Smoking status: Every Day    Packs/day: 0.50    Years: 40.00    Pack years: 20.00    Types: Cigarettes   Smokeless tobacco: Never  Substance Use Topics   Alcohol use: Not Currently   Allergies  Allergen Reactions   Sulfa Drugs Cross Reactors Hives    Hives    Prior to Admission medications   Medication Sig Start Date End Date Taking? Authorizing Provider  aspirin 81 MG tablet Take 81 mg by mouth daily. Reported on 06/30/2015   Yes [provider]  atorvastatin (LIPITOR) 80 MG tablet TAKE 1 TABLET BY MOUTH EVERY DAY 12/01/20  Yes Gollan, Kathlene November, MD  blood glucose meter kit and supplies KIT Dispense based on patient and insurance preference. Use up to four times daily as directed. (FOR ICD-9 250.00, 250.01). 05/20/18  Yes Gouru, Aruna, MD  carvedilol (COREG) 6.25 MG tablet TAKE 1 TABLET BY MOUTH TWICE A DAY 12/01/20  Yes Gollan, Kathlene November, MD  dapagliflozin propanediol (FARXIGA) 10 MG TABS tablet Take 1 tablet (10 mg total) by mouth daily before breakfast. 12/20/20  Yes Pratik Dalziel A, FNP  Insulin Glargine (LANTUS SOLOSTAR) 100 UNIT/ML Solostar Pen Inject 15 Units into the skin daily. 05/20/18  Yes Gouru, Illene Silver, MD  Insulin Pen Needle 32G X 4 MM MISC 15 Units by Does not apply route daily at 10 pm. 05/20/18  Yes Gouru, Illene Silver, MD  metFORMIN (GLUCOPHAGE) 500 MG tablet Take 1 tablet (500 mg total) by mouth 2 (two) times daily with a meal. 05/20/18  Yes Gouru, Aruna, MD  methimazole (TAPAZOLE) 5 MG tablet Take 2.5 mg by mouth daily. 08/26/19  Yes [provider]  Multiple Vitamin (MULTIVITAMIN) tablet Take 1 tablet by mouth daily.   Yes [provider]  nicotine (NICOTINE  STEP 1) 21 mg/24hr patch Place 1 patch (21 mg total) onto the skin daily. 11/23/19  Yes Visser, [name] D, PA-C  nitroGLYCERIN (NITROSTAT) 0.4 MG SL tablet Place 1 tablet (0.4 mg total) under the tongue every 5 (five) minutes as needed for chest pain. 04/15/19 11/22/28 Yes Elliet Goodnow A, FNP  sacubitril-valsartan (ENTRESTO) 97-103 MG Take 1 tablet by mouth 2 (two) times daily.   Yes [provider]  spironolactone (ALDACTONE) 25 MG tablet Take 1 tablet (25 mg total) by mouth daily.  10/23/20  Yes Minna Merritts, MD  ticagrelor (BRILINTA) 90 MG TABS tablet Take 1 tablet (90 mg total) by mouth 2 (two) times daily. 12/09/19   Theora Gianotti, NP   Review of Systems  Constitutional:  Positive for fatigue (with moderate exertion). Negative for appetite change.  HENT:  Negative for congestion, ear pain, hearing loss, rhinorrhea, sore throat and  tinnitus.   Eyes: Negative.  Negative for visual disturbance.  Respiratory:  Positive for cough (productive, associated with smoking) and shortness of breath (associated with smoking).   Cardiovascular:  Positive for palpitations (on occasion). Negative for chest pain and leg swelling.  Gastrointestinal:  Negative for abdominal distention and abdominal pain.  Endocrine: Negative.   Genitourinary: Negative.   Musculoskeletal:  Negative for back pain and neck pain.  Skin: Negative.   Allergic/Immunologic: Negative.   Neurological:  Negative for dizziness, syncope, weakness, light-headedness and headaches.  Hematological:  Negative for adenopathy. Does not bruise/bleed easily.  Psychiatric/Behavioral:  Negative for dysphoric mood and sleep disturbance (CPAP with 1 pillow under head). The patient is not nervous/anxious.    Vitals:   01/18/21 1438  BP: (!) 156/70  Pulse: 76  Resp: 20  SpO2: 96%  Weight: 179 lb 2 oz (81.3 kg)  Height: '5\' 9"'  (1.753 m)   Wt Readings from Last 3 Encounters:  01/18/21 179 lb 2 oz (81.3 kg)  12/20/20 181 lb  (82.1 kg)  06/20/20 183 lb 6 oz (83.2 kg)   Lab Results  Component Value Date   CREATININE 1.10 11/22/2019   CREATININE 1.29 (H) 11/12/2019   CREATININE 1.46 (H) 11/10/2019   Physical Exam Vitals and nursing note reviewed.  Constitutional:      Appearance: Normal appearance.  HENT:     Head: Normocephalic and atraumatic.     Right Ear: External ear normal.     Left Ear: External ear normal.  Neck:     Comments: - JVD Cardiovascular:     Rate and Rhythm: Normal rate and regular rhythm.     Pulses: Normal pulses.     Heart sounds: Normal heart sounds. No murmur heard.   No gallop.  Pulmonary:     Effort: Pulmonary effort is normal. No respiratory distress.     Breath sounds: Normal breath sounds. No wheezing or rales.  Abdominal:     General: There is no distension.     Palpations: Abdomen is soft.     Tenderness: There is no abdominal tenderness.  Musculoskeletal:        General: No tenderness.     Cervical back: Normal range of motion and neck supple.     Right lower leg: No edema.     Left lower leg: No edema.  Skin:    General: Skin is warm and dry.  Neurological:     General: No focal deficit present.     Mental Status: Alexander Cobb is alert and oriented to person, place, and time.  Psychiatric:        Mood and Affect: Mood normal.        Behavior: Behavior normal.        Thought Content: Thought content normal.   Assessment & Plan:  1: Chronic heart failure with reduced ejection fraction- - NYHA class II - euvolemic today - weighing daily; reminded to call for an overnight weight gain of >2 pounds or a weekly weight gain of >5 pounds.  - weight down 2 pounds from last visit 1 month ago  - not adding salt and tries to eat low sodium foods; reviewed the importance of closely following a low sodium diet  - saw cardiology [name]) 7/22; needs to call to schedule f/u - on GDMT of carvedilol, Entresto, farxiga and spironolactone - will get BMP today since farxiga started  at last visit  2: HTN- - BP mildly elevated today; instructed to bring Alexander Cobb digital  BP cuff to Alexander Cobb next appointment so we can show Alexander Cobb how to use it - saw PCP [name]) 10/18/20 - BMP from 10/11/20 reviewed and showed sodium 134, potassium 4.6, creatinine 1.2 and GFR 60  3: DM- - A1c 10/11/20 was 9%, up from previous 8.4%  4: Tobacco use- - smoking, cannot quantify but states Alexander Cobb has not cut down any - complete cessation discussed for 3 minutes with Alexander Cobb   Patient did not bring Alexander Cobb medication bottles for review. Reviewed medication list, Alexander Cobb advises Alexander Cobb is taking all as prescribed.    Return in 3 months or sooner for any questions/problems before then.

## 2021-01-18 NOTE — Patient Instructions (Signed)
Continue weighing daily and call for an overnight weight gain of > 2 pounds or a weekly weight gain of >5 pounds. 

## 2021-03-19 ENCOUNTER — Telehealth: Payer: Self-pay | Admitting: Family

## 2021-03-19 NOTE — Telephone Encounter (Signed)
Spoke to patients wife to let them know we need proof of income for [employer] patient assistance application for [employer]. She stated she would have him email it.  Ravina Milner, NT

## 2021-04-20 ENCOUNTER — Ambulatory Visit: Payer: TRICARE For Life (TFL) | Admitting: Family

## 2021-04-24 ENCOUNTER — Other Ambulatory Visit: Payer: Self-pay

## 2021-04-24 ENCOUNTER — Ambulatory Visit: Payer: Medicare HMO | Attending: Family | Admitting: Family

## 2021-04-24 ENCOUNTER — Encounter: Payer: Self-pay | Admitting: Family

## 2021-04-24 VITALS — BP 169/85 | HR 65 | Resp 16 | Ht 69.0 in | Wt 179.6 lb

## 2021-04-24 DIAGNOSIS — F17210 Nicotine dependence, cigarettes, uncomplicated: Secondary | ICD-10-CM | POA: Insufficient documentation

## 2021-04-24 DIAGNOSIS — I110 Hypertensive heart disease with heart failure: Secondary | ICD-10-CM | POA: Diagnosis not present

## 2021-04-24 DIAGNOSIS — Z794 Long term (current) use of insulin: Secondary | ICD-10-CM | POA: Diagnosis not present

## 2021-04-24 DIAGNOSIS — E785 Hyperlipidemia, unspecified: Secondary | ICD-10-CM | POA: Diagnosis not present

## 2021-04-24 DIAGNOSIS — I2510 Atherosclerotic heart disease of native coronary artery without angina pectoris: Secondary | ICD-10-CM | POA: Diagnosis not present

## 2021-04-24 DIAGNOSIS — G4733 Obstructive sleep apnea (adult) (pediatric): Secondary | ICD-10-CM | POA: Diagnosis not present

## 2021-04-24 DIAGNOSIS — I10 Essential (primary) hypertension: Secondary | ICD-10-CM

## 2021-04-24 DIAGNOSIS — E119 Type 2 diabetes mellitus without complications: Secondary | ICD-10-CM | POA: Insufficient documentation

## 2021-04-24 DIAGNOSIS — I5022 Chronic systolic (congestive) heart failure: Secondary | ICD-10-CM | POA: Insufficient documentation

## 2021-04-24 DIAGNOSIS — Z716 Tobacco abuse counseling: Secondary | ICD-10-CM | POA: Diagnosis not present

## 2021-04-24 DIAGNOSIS — Z955 Presence of coronary angioplasty implant and graft: Secondary | ICD-10-CM | POA: Diagnosis not present

## 2021-04-24 DIAGNOSIS — Z720 Tobacco use: Secondary | ICD-10-CM

## 2021-04-24 MED ORDER — LOSARTAN POTASSIUM 100 MG PO TABS: 100.0000 mg | ORAL_TABLET | Freq: Every day | ORAL | 3 refills | Status: DC

## 2021-04-24 NOTE — Progress Notes (Signed)
Patient ID: Alexander Cobb, male    DOB: 1950-11-21, 71 y.o.   MRN: [id number]   Alexander Cobb is a 71 y/o male with a history of CAD, DM, hyperlipidemia, HTN, obstructive sleep apnea, current tobacco use and chronic heart failure.   Echo report from 11/10/19 reviewed and showed an EF of 40-45%. Echo report from 06/15/19 reviewed and showed an EF of 45-50% along with moderate LVH and trivial Alexander. Echo report from 05/18/2018 reviewed and showed an EF of 35-40% along with trivial Alexander.  Catheterization done 11/09/19 showed: Prox Cx lesion is 75% stenosed. Mid Cx lesion is 75% stenosed. RPDA lesion is 99% stenosed. Prox RCA lesion is 30% stenosed. Mid RCA lesion is 30% stenosed. A drug-eluting stent was successfully placed using a STENT RESOLUTE ONYX 2.25X30. Post intervention, there is a 0% residual stenosis. Severely depressed overall left ventricular function around 25 to 30% with inferior akinesis left ventricular enlargement   Conclusion LV function is globally depressed at around 25 to 30% with inferior akinesis Coronaries Long left main relatively free of disease LAD is moderate in size relatively free of disease Circumflex is moderate in size with OM1 with tandem 75% lesions RCA was a large vessel with anterior takeoff with mild to moderate disease proximal to mid but the PDA was diffusely diseased focally of around 99% long lesion TIMI-3 flow Successful PCI and stent of PDA with a 2.25 x 30 mm resolute Onyx reducing the lesion from 99 down to 0%   Has not been admitted or been in the ED in the last 6 months.   He presents today for a follow-up visit with a chief complaint of minimal fatigue upon moderate exertion. He describes this as chronic in nature having been present for several years. He has associated cough, shortness of breath and palpitations along with this. He continues to work full time. He denies any difficulty sleeping, dizziness, abdominal distention, pedal edema, chest pain or  weight gain.   Has been out of his entresto for a month. He is frustrated at the amount of information he has to continually provide to [employer] and does not want to take entresto anymore.     Past Medical History:  Diagnosis Date   CAD (coronary artery disease)    a. 12/2010 Cath: nonobs dzs; b. 10/2019 STEMI/PCI: LM nl, LAD nl, LCX 75p/m, RCA 30p, 60md, RPDA 99 (2.25x30 Resolute Onyx DES); c. 11/2019 MV: moderate apical inf fixed defect-->diaphragmatic atten/artiact. No ischemia. EF 51%. Low risk.   Diabetes mellitus without complication ([facility]    HFrEF (heart failure with reduced ejection fraction) ([place]    a. 07/2010 LV gram: EF 33%; b. 05/2018 Echo: EF 30-35%; c. 05/2018 TEE: EF 35-40%; d. 11/2019 Echo: EF 40-45%, gr1 DD. Antlat and inf HK. Nl RV fxn.   HTN (hypertension) 12/21/2010   Hyperlipidemia    Mixed Ischemic and non-ischemic cardiomyopathy    a. 07/2010 LV gram: EF 33%; b. 05/2018 Echo: EF 30-35%; c. 05/2018 TEE: EF 35-40%; d. 11/2019 Echo: EF 40-45%.   OSA (obstructive sleep apnea)    Uses CPAP at home   PSVT ([place]oxysmal [place]ricular tachycardia) ([place] 12/21/2010   Tobacco abuse    Past Surgical History:  Procedure Laterality Date   CARDIAC CATHETERIZATION     COLONOSCOPY WITH PROPOFOL N/A 11/07/2015   Procedure: COLONOSCOPY WITH PROPOFOL;  Surgeon: MLollie Sails MD;  [facility];  Service: Endoscopy;  Laterality: N/A;   CORONARY/GRAFT ACUTE MI REVASCULARIZATION N/A 11/09/2019  Procedure: Coronary/Graft Acute MI Revascularization;  Surgeon: Yolonda Kida, MD;  Location: [place] [facility];  Service: Cardiovascular;  Laterality: N/A;   LEFT HEART CATH AND CORONARY ANGIOGRAPHY N/A 11/09/2019   Procedure: LEFT HEART CATH AND CORONARY ANGIOGRAPHY;  Surgeon: Yolonda Kida, MD;  Location: [place] [facility];  Service: Cardiovascular;  Laterality: N/A;   TEE WITHOUT CARDIOVERSION N/A 05/18/2018   Procedure: TRANSESOPHAGEAL ECHOCARDIOGRAM (TEE);  Surgeon:  Wellington Hampshire, MD;  Location: [facility];  Service: Cardiovascular;  Laterality: N/A;   Family History  Problem Relation Age of Onset   COPD Father    Social History   Tobacco Use   Smoking status: Every Day    Packs/day: 0.50    Years: 40.00    Pack years: 20.00    Types: Cigarettes   Smokeless tobacco: Never  Substance Use Topics   Alcohol use: Not Currently   Allergies  Allergen Reactions   Sulfa Drugs Cross Reactors Hives    Hives    Prior to Admission medications   Medication Sig Start Date End Date Taking? Authorizing Provider  aspirin 81 MG tablet Take 81 mg by mouth daily. Reported on 06/30/2015   Yes [provider]  atorvastatin (LIPITOR) 80 MG tablet TAKE 1 TABLET BY MOUTH EVERY DAY 12/01/20  Yes Gollan, Kathlene November, MD  blood glucose meter kit and supplies KIT Dispense based on patient and insurance preference. Use up to four times daily as directed. (FOR ICD-9 250.00, 250.01). 05/20/18  Yes Gouru, Aruna, MD  carvedilol (COREG) 6.25 MG tablet TAKE 1 TABLET BY MOUTH TWICE A DAY 12/01/20  Yes Gollan, Kathlene November, MD  dapagliflozin propanediol (FARXIGA) 10 MG TABS tablet Take 1 tablet (10 mg total) by mouth daily before breakfast. 12/20/20  Yes Hackney, Tina A, FNP  Insulin Glargine (LANTUS SOLOSTAR) 100 UNIT/ML Solostar Pen Inject 15 Units into the skin daily. 05/20/18  Yes Gouru, Illene Silver, MD  Insulin Pen Needle 32G X 4 MM MISC 15 Units by Does not apply route daily at 10 pm. 05/20/18  Yes Gouru, Illene Silver, MD  metFORMIN (GLUCOPHAGE) 500 MG tablet Take 1 tablet (500 mg total) by mouth 2 (two) times daily with a meal. 05/20/18  Yes Gouru, Aruna, MD  methimazole (TAPAZOLE) 5 MG tablet Take 2.5 mg by mouth daily. 08/26/19  Yes [provider]  Multiple Vitamin (MULTIVITAMIN) tablet Take 1 tablet by mouth daily.   Yes [provider]  nicotine (NICOTINE STEP 1) 21 mg/24hr patch Place 1 patch (21 mg total) onto the skin daily. 11/23/19  Yes Visser, [name] D, PA-C   nitroGLYCERIN (NITROSTAT) 0.4 MG SL tablet Place 1 tablet (0.4 mg total) under the tongue every 5 (five) minutes as needed for chest pain. 04/15/19 11/22/28 Yes Hackney, Tina A, FNP  sacubitril-valsartan (ENTRESTO) 97-103 MG Take 1 tablet by mouth 2 (two) times daily.   Yes [provider]  spironolactone (ALDACTONE) 25 MG tablet Take 1 tablet (25 mg total) by mouth daily.  10/23/20  Yes Minna Merritts, MD  ticagrelor (BRILINTA) 90 MG TABS tablet Take 1 tablet (90 mg total) by mouth 2 (two) times daily. 12/09/19   Theora Gianotti, NP   Review of Systems  Constitutional:  Positive for fatigue (with moderate exertion). Negative for appetite change.  HENT:  Negative for congestion, ear pain, hearing loss, rhinorrhea, sore throat and tinnitus.   Eyes: Negative.  Negative for visual disturbance.  Respiratory:  Positive for cough (productive, associated with smoking) and  shortness of breath (associated with smoking).   Cardiovascular:  Positive for palpitations (on occasion). Negative for chest pain and leg swelling.  Gastrointestinal:  Negative for abdominal distention and abdominal pain.  Endocrine: Negative.   Genitourinary: Negative.   Musculoskeletal:  Negative for back pain and neck pain.  Skin: Negative.   Allergic/Immunologic: Negative.   Neurological:  Negative for dizziness, syncope, weakness, light-headedness and headaches.  Hematological:  Negative for adenopathy. Does not bruise/bleed easily.  Psychiatric/Behavioral:  Negative for dysphoric mood and sleep disturbance (CPAP with 1 pillow under head). The patient is not nervous/anxious.    Vitals:   04/24/21 1230  BP: (!) 169/85  Pulse: 65  Resp: 16  SpO2: 98%  Weight: 179 lb 9 oz (81.4 kg)  Height: '5\' 9"'  (1.753 m)   Wt Readings from Last 3 Encounters:  04/24/21 179 lb 9 oz (81.4 kg)  01/18/21 179 lb 2 oz (81.3 kg)  12/20/20 181 lb (82.1 kg)   Lab Results  Component Value Date   CREATININE 1.23 01/18/2021    CREATININE 1.10 11/22/2019   CREATININE 1.29 (H) 11/12/2019   Physical Exam Vitals and nursing note reviewed.  Constitutional:      Appearance: Normal appearance.  HENT:     Head: Normocephalic and atraumatic.     Right Ear: External ear normal.     Left Ear: External ear normal.  Neck:     Comments: - JVD Cardiovascular:     Rate and Rhythm: Normal rate and regular rhythm.     Pulses: Normal pulses.     Heart sounds: Normal heart sounds. No murmur heard.   No gallop.  Pulmonary:     Effort: Pulmonary effort is normal. No respiratory distress.     Breath sounds: Normal breath sounds. No wheezing or rales.  Abdominal:     General: There is no distension.     Palpations: Abdomen is soft.     Tenderness: There is no abdominal tenderness.  Musculoskeletal:        General: No tenderness.     Cervical back: Normal range of motion and neck supple.     Right lower leg: No edema.     Left lower leg: No edema.  Skin:    General: Skin is warm and dry.  Neurological:     General: No focal deficit present.     Mental Status: He is alert and oriented to person, place, and time.  Psychiatric:        Mood and Affect: Mood normal.        Behavior: Behavior normal.        Thought Content: Thought content normal.   Assessment & Plan:  1: Chronic heart failure with reduced ejection fraction- - NYHA class II - euvolemic today - weighing daily; reminded to call for an overnight weight gain of >2 pounds or a weekly weight gain of >5 pounds.  - weight unchanged from last visit  - not adding salt and tries to eat low sodium foods; reviewed the importance of closely following a low sodium diet  - saw cardiology Alexander Cobb) 7/22; needs to call to schedule f/u - on GDMT of carvedilol, farxiga and spironolactone - out of entresto x month, not willing to restart due to great amount of financial [employer] patient assistance is now requiring - will start losartan 100 mg QD  2: HTN- - BP  169/85; had been out of entresto, starting losartan per above - saw PCP Alexander Cobb) 02/19/21 - BMP  from 01/18/21 reviewed and showed sodium 135, potassium 3.8, creatinine 1.23 and GFR > 60  3: DM- - A1C 02/15/21 8.3%  4: Tobacco use- - smoking, 1 PPD, is trying to reduce amount  - complete cessation discussed for 3 minutes with him   Patient did not bring his medication bottles for review. Reviewed medication list, he advises he is taking all as prescribed.    Return in 1 month or sooner for any questions/problems before then.

## 2021-04-24 NOTE — Patient Instructions (Signed)
We are going to start you on losartan 100 mg to take once/day; this will replace the entresto you were taking.  Do not take any more entresto.  Continue to weigh daily.   Continue to try to wean down your cigarette use.   Return in 1 month.

## 2021-04-25 ENCOUNTER — Encounter: Payer: Self-pay | Admitting: Family

## 2021-05-21 NOTE — Progress Notes (Deleted)
? Patient ID: Alexander Cobb, male    DOB: [birth year], 71 y.o.   MRN: [id number] ? ? ?Mr Luse is a 71 y/o male with a history of CAD, DM, hyperlipidemia, HTN, obstructive sleep apnea, current tobacco use and chronic heart failure.  ? ?Echo report from 11/10/19 reviewed and showed an EF of 40-45%. Echo report from 06/15/19 reviewed and showed an EF of 45-50% along with moderate LVH and trivial MR. Echo report from 05/18/2018 reviewed and showed an EF of 35-40% along with trivial MR. ? ?Catheterization done 11/09/19 showed: ?Prox Cx lesion is 75% stenosed. ?Mid Cx lesion is 75% stenosed. ?RPDA lesion is 99% stenosed. ?Prox RCA lesion is 30% stenosed. ?Mid RCA lesion is 30% stenosed. ?A drug-eluting stent was successfully placed using a STENT RESOLUTE ONYX 2.25X30. ?Post intervention, there is a 0% residual stenosis. ?Severely depressed overall left ventricular function around 25 to 30% with inferior akinesis left ventricular enlargement ?  ?Conclusion ?LV function is globally depressed at around 25 to 30% with inferior akinesis ?Coronaries ?Long left main relatively free of disease LAD is moderate in size relatively free of disease ?Circumflex is moderate in size with OM1 with tandem 75% lesions ?RCA was a large vessel with anterior takeoff with mild to moderate disease proximal to mid but the PDA was diffusely diseased focally of around 99% long lesion TIMI-3 flow ?Successful PCI and stent of PDA with a 2.25 x 30 mm resolute Onyx reducing the lesion from 99 down to 0% ?  ?Has not been admitted or been in the ED in the last 6 months.  ? ?He presents today for a follow-up visit with a chief complaint of  ? ?  ?Past Medical History:  ?Diagnosis Date  ? CAD (coronary artery disease)   ? a. 12/2010 Cath: nonobs dzs; b. 10/2019 STEMI/PCI: LM nl, LAD nl, LCX 75p/m, RCA 30p, 54md, RPDA 99 (2.25x30 Resolute Onyx DES); c. 11/2019 MV: moderate apical inf fixed defect-->diaphragmatic atten/artiact. No ischemia. EF 51%. Low risk.  ?  Diabetes [place] without complication ([facility]   ? [place]FrEF (heart failure with reduced ejection fraction) ([facility]   ? a. 07/2010 LV gram: EF 33%; b. 05/2018 Echo: EF 30-35%; c. 05/2018 TEE: EF 35-40%; d. 11/2019 Echo: EF 40-45%, gr1 DD. Antlat and inf HK. Nl RV fxn.  ? HTN (hypertension) 12/21/2010  ? Hyperlipidemia   ? Mixed Ischemic and non-ischemic cardiomyopathy   ? a. 07/2010 LV gram: EF 33%; b. 05/2018 Echo: EF 30-35%; c. 05/2018 TEE: EF 35-40%; d. 11/2019 Echo: EF 40-45%.  ? OSA (obstructive sleep apnea)   ? Uses CPAP at home  ? PSVT (paroxysmal supraventricular tachycardia) ([facility] 12/21/2010  ? Tobacco abuse   ? ?Past Surgical History:  ?Procedure Laterality Date  ? CARDIAC CATHETERIZATION    ? COLONOSCOPY WITH PROPOFOL N/A 11/07/2015  ? Procedure: COLONOSCOPY WITH PROPOFOL;  Surgeon: MLollie Sails MD;  [facility];  Service: Endoscopy;  Laterality: N/A;  ? CORONARY/GRAFT ACUTE MI REVASCULARIZATION N/A 11/09/2019  ? Procedure: Coronary/Graft Acute MI Revascularization;  Surgeon: CYolonda Kida MD;  [facility];  Service: Cardiovascular;  Laterality: N/A;  ? LEFT HEART CATH AND CORONARY ANGIOGRAPHY N/A 11/09/2019  ? Procedure: LEFT HEART CATH AND CORONARY ANGIOGRAPHY;  Surgeon: CYolonda Kida MD;  [facility];  Service: Cardiovascular;  Laterality: N/A;  ? TEE WITHOUT CARDIOVERSION N/A 05/18/2018  ? Procedure: TRANSESOPHAGEAL ECHOCARDIOGRAM (TEE);  Surgeon: AWellington Hampshire MD;  Location: [facility];  Service: Cardiovascular;  Laterality: N/A;  ? ?Family History  ?Problem Relation Age of Onset  ? COPD Father   ? ?Social History  ? ?Tobacco Use  ? Smoking status: Every Day  ?  Packs/day: 0.50  ?  Years: 40.00  ?  Pack years: 20.00  ?  Types: Cigarettes  ? Smokeless tobacco: Never  ?Substance Use Topics  ? Alcohol use: Not Currently  ? ?Allergies  ?Allergen Reactions  ? Sulfa Drugs Cross Reactors Hives  ?  Hives ?  ? ? ?Review of Systems  ?Constitutional:  Positive  for fatigue (with moderate exertion). Negative for appetite change.  ?HENT:  Negative for congestion, ear pain, hearing loss, rhinorrhea, sore throat and tinnitus.   ?Eyes: Negative.  Negative for visual disturbance.  ?Respiratory:  Positive for cough (productive, associated with smoking) and shortness of breath (associated with smoking).   ?Cardiovascular:  Positive for palpitations (on occasion). Negative for chest pain and leg swelling.  ?Gastrointestinal:  Negative for abdominal distention and abdominal pain.  ?Endocrine: Negative.   ?Genitourinary: Negative.   ?Musculoskeletal:  Negative for back pain and neck pain.  ?Skin: Negative.   ?Allergic/Immunologic: Negative.   ?Neurological:  Negative for dizziness, syncope, weakness, light-headedness and headaches.  ?Hematological:  Negative for adenopathy. Does not bruise/bleed easily.  ?Psychiatric/Behavioral:  Negative for dysphoric mood and sleep disturbance (CPAP with 1 pillow under head). The patient is not nervous/anxious.   ? ? ? ?Physical Exam ?Vitals and nursing note reviewed.  ?Constitutional:   ?   Appearance: Normal appearance.  ?HENT:  ?   Head: Normocephalic and atraumatic.  ?   Right Ear: External ear normal.  ?   Left Ear: External ear normal.  ?Neck:  ?   Comments: - JVD ?Cardiovascular:  ?   Rate and Rhythm: Normal rate and regular rhythm.  ?   Pulses: Normal pulses.  ?   Heart sounds: Normal heart sounds. No murmur heard. ?  No gallop.  ?Pulmonary:  ?   Effort: Pulmonary effort is normal. No respiratory distress.  ?   Breath sounds: Normal breath sounds. No wheezing or rales.  ?Abdominal:  ?   General: There is no distension.  ?   Palpations: Abdomen is soft.  ?   Tenderness: There is no abdominal tenderness.  ?Musculoskeletal:     ?   General: No tenderness.  ?   Cervical back: Normal range of motion and neck supple.  ?   Right lower leg: No edema.  ?   Left lower leg: No edema.  ?Skin: ?   General: Skin is warm and dry.  ?Neurological:  ?    General: No focal deficit present.  ?   Mental Status: He is alert and oriented to person, place, and time.  ?Psychiatric:     ?   Mood and Affect: Mood normal.     ?   Behavior: Behavior normal.     ?   Thought Content: Thought content normal.  ? ?Assessment & Plan: ? ?1: Chronic heart failure with reduced ejection fraction- ?- NYHA class II ?- euvolemic today ?- weighing daily; reminded to call for an overnight weight gain of >2 pounds or a weekly weight gain of >5 pounds.  ?- weight unchanged from last visit  ?- not adding salt and tries to eat low sodium foods; reviewed the importance of closely following a low sodium diet  ?- saw cardiology [name]) 7/22; needs to call to schedule f/u ?- on GDMT of  carvedilol, farxiga, losartan and spironolactone ?- check BMP today as losartan '100mg'$  started at last visit ? ?2: HTN- ?- BP  ?- saw PCP [name]) 02/19/21 ?- BMP from 02/15/21 reviewed and showed sodium 137, potassium 4.4, creatinine 1.3 and GFR 55 ? ?3: DM- ?- A1C 02/15/21 8.3% ? ?4: Tobacco use- ?- smoking, 1 PPD, is trying to reduce amount  ?- complete cessation discussed for 3 minutes with him ? ? ?Patient did not bring his medication bottles for review. Reviewed medication list, he advises he is taking all as prescribed.   ? ? ? ? ? ? ? ? ? ?  ? ?  ? ? ? ? ? ? ? ?

## 2021-05-22 ENCOUNTER — Ambulatory Visit (HOSPITAL_BASED_OUTPATIENT_CLINIC_OR_DEPARTMENT_OTHER): Payer: Medicare HMO | Admitting: Family

## 2021-05-22 ENCOUNTER — Encounter: Payer: Self-pay | Admitting: Family

## 2021-05-22 ENCOUNTER — Other Ambulatory Visit
Admission: RE | Admit: 2021-05-22 | Discharge: 2021-05-22 | Disposition: A | Discharge status: Home / Self Care | Payer: Medicare HMO | Source: Ambulatory Visit | Attending: Family | Admitting: Family

## 2021-05-22 ENCOUNTER — Other Ambulatory Visit: Payer: Self-pay

## 2021-05-22 VITALS — BP 180/78 | HR 70 | Resp 20 | Ht 69.0 in | Wt 179.2 lb

## 2021-05-22 DIAGNOSIS — I2510 Atherosclerotic heart disease of native coronary artery without angina pectoris: Secondary | ICD-10-CM | POA: Insufficient documentation

## 2021-05-22 DIAGNOSIS — I10 Essential (primary) hypertension: Secondary | ICD-10-CM

## 2021-05-22 DIAGNOSIS — E119 Type 2 diabetes mellitus without complications: Secondary | ICD-10-CM | POA: Insufficient documentation

## 2021-05-22 DIAGNOSIS — G4733 Obstructive sleep apnea (adult) (pediatric): Secondary | ICD-10-CM | POA: Insufficient documentation

## 2021-05-22 DIAGNOSIS — F17210 Nicotine dependence, cigarettes, uncomplicated: Secondary | ICD-10-CM | POA: Insufficient documentation

## 2021-05-22 DIAGNOSIS — I5022 Chronic systolic (congestive) heart failure: Secondary | ICD-10-CM | POA: Insufficient documentation

## 2021-05-22 DIAGNOSIS — I110 Hypertensive heart disease with heart failure: Secondary | ICD-10-CM | POA: Insufficient documentation

## 2021-05-22 DIAGNOSIS — Z720 Tobacco use: Secondary | ICD-10-CM

## 2021-05-22 DIAGNOSIS — E785 Hyperlipidemia, unspecified: Secondary | ICD-10-CM | POA: Insufficient documentation

## 2021-05-22 DIAGNOSIS — F329 Major depressive disorder, single episode, unspecified: Secondary | ICD-10-CM

## 2021-05-22 LAB — BASIC METABOLIC PANEL
Anion gap: 6 (ref 5–15)
BUN: 18 mg/dL (ref 8–23)
CO2: 28 mmol/L (ref 22–32)
Calcium: 8.8 mg/dL — ABNORMAL LOW (ref 8.9–10.3)
Chloride: 100 mmol/L (ref 98–111)
Creatinine, Ser: 1.17 mg/dL (ref 0.61–1.24)
GFR, Estimated: >60 mL/min (ref >60)
Glucose, Bld: 168 mg/dL — ABNORMAL HIGH (ref 70–99)
Potassium: 3.4 mmol/L — ABNORMAL LOW (ref 3.5–5.1)
Sodium: 134 mmol/L — ABNORMAL LOW (ref 135–145)

## 2021-05-22 MED ORDER — CARVEDILOL 12.5 MG PO TABS: 12.5000 mg | ORAL_TABLET | Freq: Two times a day (BID) | ORAL | 3 refills | Status: DC

## 2021-05-22 MED ORDER — SPIRONOLACTONE 25 MG PO TABS: 25.0000 mg | ORAL_TABLET | Freq: Every day | ORAL | 3 refills | Status: DC

## 2021-05-22 NOTE — Patient Instructions (Signed)
Double your coreg (carvedilol) dose, so take 2 tablets in the am, 2 tablets in the pm until you run out of the current bottle. ? ?YOUR BP WAS TOO HIGH TODAY! 180/78. Guidelines suggest BP should be 120-130/70-80.  ? ?We are going to check your blood work today. ? ?Return in 1 week for repeat blood work at pre-admission testing, on 05/29/21, you do not need to see [place], just get your blood work.  ? ?We will call you once we get your blood work back, if it is ok, then you will start taking spironolactone.  ?

## 2021-05-22 NOTE — Progress Notes (Signed)
Patient ID: CASWELL ALVILLAR, male    DOB: [birth year], 71 y.o.   MRN: [id number]   Mr Hamad is a 71 y/o male with a history of CAD, DM, hyperlipidemia, HTN, obstructive sleep apnea, current tobacco use and chronic heart failure.   Echo report from 11/10/19 reviewed and showed an EF of 40-45%. Echo report from 06/15/19 reviewed and showed an EF of 45-50% along with moderate LVH and trivial MR. Echo report from 05/18/2018 reviewed and showed an EF of 35-40% along with trivial MR.  Catheterization done 11/09/19 showed: Prox Cx lesion is 75% stenosed. Mid Cx lesion is 75% stenosed. RPDA lesion is 99% stenosed. Prox RCA lesion is 30% stenosed. Mid RCA lesion is 30% stenosed. A drug-eluting stent was successfully placed using a STENT RESOLUTE ONYX 2.25X30. Post intervention, there is a 0% residual stenosis. Severely depressed overall left ventricular function around 25 to 30% with inferior akinesis left ventricular enlargement   Conclusion LV function is globally depressed at around 25 to 30% with inferior akinesis Coronaries Long left main relatively free of disease LAD is moderate in size relatively free of disease Circumflex is moderate in size with OM1 with tandem 75% lesions RCA was a large vessel with anterior takeoff with mild to moderate disease proximal to mid but the PDA was diffusely diseased focally of around 99% long lesion TIMI-3 flow Successful PCI and stent of PDA with a 2.25 x 30 mm resolute Onyx reducing the lesion from 99 down to 0%   Has not been admitted or been in the ED in the last 6 months.   He presents today for a follow-up visit with a chief complaint of minimal fatigue upon moderate exertion. He describes this as chronic in nature having been present for several years. He has associated cough, shortness of breath and palpitations along with this. He continues to work full time. He denies any difficulty sleeping, dizziness, abdominal distention, pedal edema, chest pain or  weight gain.    Past Medical History:  Diagnosis Date   CAD (coronary artery disease)    a. 12/2010 Cath: nonobs dzs; b. 10/2019 STEMI/PCI: LM nl, LAD nl, LCX 75p/m, RCA 30p, 68md, RPDA 99 (2.25x30 Resolute Onyx DES); c. 11/2019 MV: moderate apical inf fixed defect-->diaphragmatic atten/artiact. No ischemia. EF 51%. Low risk.   Diabetes mellitus without complication ([facility]    HFrEF (heart failure with reduced ejection fraction) [place]    a. 07/2010 LV gram: EF 33%; b. 05/2018 Echo: EF 30-35%; c. 05/2018 TEE: EF 35-40%; d. 11/2019 Echo: EF 40-45%, gr1 DD. Antlat and inf HK. Nl RV fxn.   HTN (hypertension) 12/21/2010   Hyperlipidemia    Mixed Ischemic and non-ischemic cardiomyopathy    a. 07/2010 LV gram: EF 33%; b. 05/2018 Echo: EF 30-35%; c. 05/2018 TEE: EF 35-40%; d. 11/2019 Echo: EF 40-45%.   OSA (obstructive sleep apnea)    Uses CPAP at home   PSVT (paroxysmal supraventricular tachycardia) ([facility] 12/21/2010   Tobacco abuse    Past Surgical History:  Procedure Laterality Date   CARDIAC CATHETERIZATION     COLONOSCOPY WITH PROPOFOL N/A 11/07/2015   Procedure: COLONOSCOPY WITH PROPOFOL;  Surgeon: MLollie Sails MD;  [facility];  Service: Endoscopy;  Laterality: N/A;   CORONARY/GRAFT ACUTE MI REVASCULARIZATION N/A 11/09/2019   Procedure: Coronary/Graft Acute MI Revascularization;  Surgeon: CYolonda Kida MD;  [facility];  Service: Cardiovascular;  Laterality: N/A;   LEFT HEART CATH AND CORONARY ANGIOGRAPHY N/A 11/09/2019  Procedure: LEFT HEART CATH AND CORONARY ANGIOGRAPHY;  Surgeon: Yolonda Kida, MD;  Location: [place] [facility];  Service: Cardiovascular;  Laterality: N/A;   TEE WITHOUT CARDIOVERSION N/A 05/18/2018   Procedure: TRANSESOPHAGEAL ECHOCARDIOGRAM (TEE);  Surgeon: Wellington Hampshire, MD;  Location: [facility] ORS;  Service: Cardiovascular;  Laterality: N/A;   Family History  Problem Relation Age of Onset   COPD Father    Social History    Tobacco Use   Smoking status: Every Day    Packs/day: 0.50    Years: 40.00    Pack years: 20.00    Types: Cigarettes   Smokeless tobacco: Never  Substance Use Topics   Alcohol use: Not Currently   Allergies  Allergen Reactions   Sulfa Drugs Cross Reactors Hives    Hives    Prior to Admission medications   Medication Sig Start Date End Date Taking? Authorizing Provider  aspirin 81 MG tablet Take 81 mg by mouth daily. Reported on 06/30/2015   Yes [provider]  atorvastatin (LIPITOR) 80 MG tablet TAKE 1 TABLET BY MOUTH EVERY DAY 12/01/20  Yes Gollan, Kathlene November, MD  blood glucose meter kit and supplies KIT Dispense based on patient and insurance preference. Use up to four times daily as directed. (FOR ICD-9 250.00, 250.01). 05/20/18  Yes Gouru, Aruna, MD  carvedilol (COREG) 12.5 MG tablet Take 1 tablet (12.5 mg total) by mouth 2 (two) times daily. 05/22/21 08/20/21 Yes Alisa Graff, FNP  dapagliflozin propanediol (FARXIGA) 10 MG TABS tablet Take 1 tablet (10 mg total) by mouth daily before breakfast. 12/20/20  Yes Darylene Price A, FNP  Insulin Glargine (LANTUS SOLOSTAR) 100 UNIT/ML Solostar Pen Inject 15 Units into the skin daily. 05/20/18  Yes Gouru, Illene Silver, MD  Insulin Pen Needle 32G X 4 MM MISC 15 Units by Does not apply route daily at 10 pm. 05/20/18  Yes Gouru, Aruna, MD  losartan (COZAAR) 100 MG tablet Take 1 tablet (100 mg total) by mouth daily. 04/24/21 07/23/21 Yes Alisa Graff, FNP  metFORMIN (GLUCOPHAGE) 500 MG tablet Take 1 tablet (500 mg total) by mouth 2 (two) times daily with a meal. 05/20/18  Yes Gouru, Aruna, MD  nitroGLYCERIN (NITROSTAT) 0.4 MG SL tablet Place 1 tablet (0.4 mg total) under the tongue every 5 (five) minutes as needed for chest pain. 04/15/19 11/22/28 Yes Hackney, Tina A, FNP  methimazole (TAPAZOLE) 5 MG tablet Take 2.5 mg by mouth daily. Patient not taking: Reported on 04/24/2021 08/26/19   [provider]  Multiple Vitamin (MULTIVITAMIN)  tablet Take 1 tablet by mouth daily. Patient not taking: Reported on 04/24/2021    [provider]    Review of Systems  Constitutional:  Positive for fatigue (with moderate exertion). Negative for appetite change.  HENT:  Negative for congestion, ear pain, hearing loss, rhinorrhea, sore throat and tinnitus.   Eyes: Negative.  Negative for visual disturbance.  Respiratory:  Positive for cough (productive, associated with smoking) and shortness of breath (associated with smoking).   Cardiovascular:  Positive for palpitations (on occasion). Negative for chest pain and leg swelling.  Gastrointestinal:  Negative for abdominal distention and abdominal pain.  Endocrine: Negative.   Genitourinary: Negative.   Musculoskeletal:  Negative for back pain and neck pain.  Skin: Negative.   Allergic/Immunologic: Negative.   Neurological:  Negative for dizziness, syncope, weakness, light-headedness and headaches.  Hematological:  Negative for adenopathy. Does not bruise/bleed easily.  Psychiatric/Behavioral:  Positive for agitation (per his wife's complaints) and  dysphoric mood. Negative for sleep disturbance (CPAP with 1 pillow under head). The patient is not nervous/anxious.    Vitals:   05/22/21 1450  BP: (!) 180/78  Pulse: 70  Resp: 20  SpO2: 99%  Weight: 179 lb 4 oz (81.3 kg)  Height: '5\' 9"'  (1.753 m)   Wt Readings from Last 3 Encounters:  05/22/21 179 lb 4 oz (81.3 kg)  04/24/21 179 lb 9 oz (81.4 kg)  01/18/21 179 lb 2 oz (81.3 kg)   Lab Results  Component Value Date   CREATININE 1.23 01/18/2021   CREATININE 1.10 11/22/2019   CREATININE 1.29 (H) 11/12/2019   Physical Exam Vitals and nursing note reviewed.  Constitutional:      General: He is not in acute distress.    Appearance: Normal appearance.  HENT:     Head: Normocephalic and atraumatic.     Right Ear: External ear normal.     Left Ear: External ear normal.  Neck:     Comments: - JVD Cardiovascular:     Rate and  Rhythm: Normal rate and regular rhythm.     Pulses: Normal pulses.     Heart sounds: Normal heart sounds. No murmur heard.   No gallop.  Pulmonary:     Effort: Pulmonary effort is normal. No respiratory distress.     Breath sounds: Normal breath sounds. No wheezing or rales.  Abdominal:     General: There is no distension.     Palpations: Abdomen is soft.     Tenderness: There is no abdominal tenderness.  Musculoskeletal:        General: No tenderness.     Cervical back: Normal range of motion and neck supple.     Right lower leg: No edema.     Left lower leg: No edema.  Skin:    General: Skin is warm and dry.  Neurological:     General: No focal deficit present.     Mental Status: He is alert and oriented to person, place, and time.  Psychiatric:        Mood and Affect: Mood normal.        Behavior: Behavior normal.        Thought Content: Thought content normal.   Assessment & Plan:  1: Chronic heart failure with reduced ejection fraction- - NYHA class II - euvolemic today - weighing daily; reminded to call for an overnight weight gain of >2 pounds or a weekly weight gain of >5 pounds.  - weight unchanged from last visit  - not adding salt and tries to eat low sodium foods; reviewed the importance of closely following a low sodium diet  - saw cardiology [name]) 7/22; needs to call to schedule f/u - on GDMT of carvedilol, farxiga, losartan  - started losartan 100 mg QD 1 month ago as he was no longer taking entresto due to issues affording - will increase coreg 12.5 BID - did not realize he was supposed to take spironolactone, spoke with pharmacy and it had not been filled since 09/18/20 - check BMP today, if ok, will re-start spironolactone  2: HTN- - BP 180/78 - increase coreg to 12.5 BID - check BMP today, if ok, will re-start spironolactone - saw PCP [name]) 02/19/21 - BMP from 01/18/21 reviewed and showed sodium 135, potassium 3.8, creatinine 1.23 and GFR >  60  3: DM- - A1C 02/15/21 8.3% - metformin   4: Tobacco use- - smoking, 1 PPD, is trying to reduce amount  5: Dysphoric mood- - reports his wife told him he needed "to get on medicine for his bad mood" - PHQ2 was negative but he does feel like he is easily agitated - advised to call PCP to see if he can be seen sooner to rule out organic causes   Patient did not bring his medication bottles for review. Reviewed medication list, he advises he is taking all as prescribed, however several discrepancies were noted when discussing medications with his pharmacy. Advised him to bring all meds each time.   Return in 1 month or sooner for any questions/problems before then.

## 2021-05-23 ENCOUNTER — Telehealth: Payer: Self-pay

## 2021-05-23 NOTE — Telephone Encounter (Addendum)
Message below relayed to patient's wife with patient's permission. Pt's wife acknowledged and stated patient will begin spironolactone and they will plan to have labs rechecked next Wednesday or Thursday. Patient instructed to call us if he has any symptoms, questions, or concerns. ?Georg Ruddle, RN ?----- Message from Alisa Graff, [place] sent at 05/23/2021  8:04 AM EDT ----- ?Labs look good. Begin spironolactone as we discussed yesterday. Take that lab prescription to the lab at the [facility] in one week to get labs rechecked.  ?

## 2021-05-31 ENCOUNTER — Other Ambulatory Visit
Admission: RE | Admit: 2021-05-31 | Discharge: 2021-05-31 | Disposition: A | Discharge status: Home / Self Care | Payer: Medicare HMO | Attending: Family | Admitting: Family

## 2021-05-31 DIAGNOSIS — I5022 Chronic systolic (congestive) heart failure: Secondary | ICD-10-CM | POA: Diagnosis present

## 2021-05-31 LAB — BASIC METABOLIC PANEL WITH GFR
Anion gap: 8 (ref 5–15)
BUN: 19 mg/dL (ref 8–23)
CO2: 26 mmol/L (ref 22–32)
Calcium: 9 mg/dL (ref 8.9–10.3)
Chloride: 102 mmol/L (ref 98–111)
Creatinine, Ser: 1.29 mg/dL — ABNORMAL HIGH (ref 0.61–1.24)
GFR, Estimated: 60 mL/min — ABNORMAL LOW
Glucose, Bld: 137 mg/dL — ABNORMAL HIGH (ref 70–99)
Potassium: 4.2 mmol/L (ref 3.5–5.1)
Sodium: 136 mmol/L (ref 135–145)

## 2021-06-21 NOTE — Telephone Encounter (Signed)
Attempted to schedule.  LMOV to call office.  ° °

## 2021-06-23 NOTE — Progress Notes (Deleted)
? Patient ID: Alexander Cobb, male    DOB: 1950/09/22, 71 y.o.   MRN: [id number] ? ? ?Alexander Cobb is a 71 y/o male with a history of CAD, DM, hyperlipidemia, HTN, obstructive sleep apnea, current tobacco use and chronic heart failure.  ? ?Echo report from 11/10/19 reviewed and showed an EF of 40-45%. Echo report from 06/15/19 reviewed and showed an EF of 45-50% along with moderate LVH and trivial Alexander. Echo report from 05/18/2018 reviewed and showed an EF of 35-40% along with trivial Alexander. ? ?Catheterization done 11/09/19 showed: ?Prox Cx lesion is 75% stenosed. ?Mid Cx lesion is 75% stenosed. ?RPDA lesion is 99% stenosed. ?Prox RCA lesion is 30% stenosed. ?Mid RCA lesion is 30% stenosed. ?A drug-eluting stent was successfully placed using a STENT RESOLUTE ONYX 2.25X30. ?Post intervention, there is a 0% residual stenosis. ?Severely depressed overall left ventricular function around 25 to 30% with inferior akinesis left ventricular enlargement ?  ?Conclusion ?LV function is globally depressed at around 25 to 30% with inferior akinesis ?Coronaries ?Long left main relatively free of disease LAD is moderate in size relatively free of disease ?Circumflex is moderate in size with OM1 with tandem 75% lesions ?RCA was a large vessel with anterior takeoff with mild to moderate disease proximal to mid but the PDA was diffusely diseased focally of around 99% long lesion TIMI-3 flow ?Successful PCI and stent of PDA with a 2.25 x 30 mm resolute Onyx reducing the lesion from 99 down to 0% ?  ?Has not been admitted or been in the ED in the last 6 months.  ? ?He presents today for a follow-up visit with a chief complaint of  ? ? ?Past Medical History:  ?Diagnosis Date  ? CAD (coronary artery disease)   ? a. 12/2010 Cath: nonobs dzs; b. 10/2019 STEMI/PCI: LM nl, LAD nl, LCX 75p/m, RCA 30p, 53md, RPDA 99 (2.25x30 Resolute Onyx DES); c. 11/2019 MV: moderate apical inf fixed defect-->diaphragmatic atten/artiact. No ischemia. EF 51%. Low risk.  ?  Di[place] complication ([place]   ? HFrEF (heart failure with reduced ejection fraction) ([place]   ? a. 07/2010 LV gram: EF 33%; b. 05/2018 Echo: EF 30-35%; c. 05/2018 TEE: EF 35-40%; d. 11/2019 Echo: EF 40-45%, gr1 DD. Antlat and inf HK. Nl RV fxn.  ? HTN (hypertension) 12/21/2010  ? Hyperlipidemia   ? Mixed Ischemic and non-ischemic cardiomyopathy   ? a. 07/2010 LV gram: EF 33%; b. 05/2018 Echo: EF 30-35%; c. 05/2018 TEE: EF 35-40%; d. 11/2019 Echo: EF 40-45%.  ? OSA (obstructive sleep apnea)   ? Uses CPAP at home  ? PSVT (paroxysmal supraventricular tachycardia) ([facility] 12/21/2010  ? Tobacco abuse   ? ?Past Surgical History:  ?Procedure Laterality Date  ? CARDIAC CATHETERIZATION    ? COLONOSCOPY WITH PROPOFOL N/A 11/07/2015  ? Procedure: COLONOSCOPY WITH PROPOFOL;  Surgeon: MLollie Sails MD;  [facility];  Service: Endoscopy;  Laterality: N/A;  ? CORONARY/GRAFT ACUTE MI REVASCULARIZATION N/A 11/09/2019  ? Procedure: Coronary/Graft Acute MI Revascularization;  Surgeon: CYolonda Kida MD;  [facility];  Service: Cardiovascular;  Laterality: N/A;  ? LEFT HEART CATH AND CORONARY ANGIOGRAPHY N/A 11/09/2019  ? Procedure: LEFT HEART CATH AND CORONARY ANGIOGRAPHY;  Surgeon: CYolonda Kida MD;  [facility];  Service: Cardiovascular;  Laterality: N/A;  ? TEE WITHOUT CARDIOVERSION N/A 05/18/2018  ? Procedure: TRANSESOPHAGEAL ECHOCARDIOGRAM (TEE);  Surgeon: AWellington Hampshire MD;  Location: [facility] ORS;  Service:  Cardiovascular;  Laterality: N/A;  ? ?Family History  ?Problem Relation Age of Onset  ? COPD Father   ? ?Social History  ? ?Tobacco Use  ? Smoking status: Every Day  ?  Packs/day: 0.50  ?  Years: 40.00  ?  Pack years: 20.00  ?  Types: Cigarettes  ? Smokeless tobacco: Never  ?Substance Use Topics  ? Alcohol use: Not Currently  ? ?Allergies  ?Allergen Reactions  ? Sulfa Drugs Cross Reactors Hives  ?  Hives ?  ? ? ? ?Review of Systems  ?Constitutional:   Positive for fatigue (with moderate exertion). Negative for appetite change.  ?HENT:  Negative for congestion, ear pain, hearing loss, rhinorrhea, sore throat and tinnitus.   ?Eyes: Negative.  Negative for visual disturbance.  ?Respiratory:  Positive for cough (productive, associated with smoking) and shortness of breath (associated with smoking).   ?Cardiovascular:  Positive for palpitations (on occasion). Negative for chest pain and leg swelling.  ?Gastrointestinal:  Negative for abdominal distention and abdominal pain.  ?Endocrine: Negative.   ?Genitourinary: Negative.   ?Musculoskeletal:  Negative for back pain and neck pain.  ?Skin: Negative.   ?Allergic/Immunologic: Negative.   ?Neurological:  Negative for dizziness, syncope, weakness, light-headedness and headaches.  ?Hematological:  Negative for adenopathy. Does not bruise/bleed easily.  ?Psychiatric/Behavioral:  Positive for agitation (per his wife's complaints) and dysphoric mood. Negative for sleep disturbance (CPAP with 1 pillow under head). The patient is not nervous/anxious.   ? ? ? ?Physical Exam ?Vitals and nursing note reviewed.  ?Constitutional:   ?   General: He is not in acute distress. ?   Appearance: Normal appearance.  ?HENT:  ?   Head: Normocephalic and atraumatic.  ?   Right Ear: External ear normal.  ?   Left Ear: External ear normal.  ?Neck:  ?   Comments: - JVD ?Cardiovascular:  ?   Rate and Rhythm: Normal rate and regular rhythm.  ?   Pulses: Normal pulses.  ?   Heart sounds: Normal heart sounds. No murmur heard. ?  No gallop.  ?Pulmonary:  ?   Effort: Pulmonary effort is normal. No respiratory distress.  ?   Breath sounds: Normal breath sounds. No wheezing or rales.  ?Abdominal:  ?   General: There is no distension.  ?   Palpations: Abdomen is soft.  ?   Tenderness: There is no abdominal tenderness.  ?Musculoskeletal:     ?   General: No tenderness.  ?   Cervical back: Normal range of motion and neck supple.  ?   Right lower leg: No  edema.  ?   Left lower leg: No edema.  ?Skin: ?   General: Skin is warm and dry.  ?Neurological:  ?   General: No focal deficit present.  ?   Mental Status: He is alert and oriented to person, place, and time.  ?Psychiatric:     ?   Mood and Affect: Mood normal.     ?   Behavior: Behavior normal.     ?   Thought Content: Thought content normal.  ? ?Assessment & Plan: ? ?1: Chronic heart failure with reduced ejection fraction- ?- NYHA class II ?- euvolemic today ?- weighing daily; reminded to call for an overnight weight gain of >2 pounds or a weekly weight gain of >5 pounds.  ?- weight 179.4 from last visit here 1 month ago ?- not adding salt and tries to eat low sodium foods; reviewed the importance of closely following  a low sodium diet  ?- saw cardiology [name]) 7/22; needs to call to schedule f/u ?- on GDMT of carvedilol, farxiga, losartan  ?- started losartan 100 mg QD 1 month ago as he was no longer taking entresto due to issues affording ? ?2: HTN- ?- BP  ?- saw PCP [name]) 06/20/21 ?- BMP from 06/18/21 reviewed and showed sodium 135, potassium 4.3, creatinine 1.29 and GFR > 60 ? ?3: DM- ?- A1C 06/18/21 was 8.1% ?- metformin  ? ?4: Tobacco use- ?- smoking, 1 PPD, is trying to reduce amount  ? ?5: Dysphoric mood- ?- sertraline '50mg'$  started after seeing PCP on 06/20/21 ? ? ? ? ? ? ? ? ? ? ? ?  ? ?  ? ? ? ? ? ? ? ?

## 2021-06-25 ENCOUNTER — Other Ambulatory Visit: Payer: Self-pay | Admitting: Internal Medicine

## 2021-06-25 ENCOUNTER — Ambulatory Visit: Payer: TRICARE For Life (TFL) | Admitting: Family

## 2021-06-25 ENCOUNTER — Telehealth: Payer: Self-pay | Admitting: Family

## 2021-06-25 DIAGNOSIS — F17210 Nicotine dependence, cigarettes, uncomplicated: Secondary | ICD-10-CM

## 2021-06-25 NOTE — Telephone Encounter (Signed)
Patient did not show for his [facility] appointment on 06/25/21. Will attempt to reschedule.   ?

## 2021-07-03 NOTE — Telephone Encounter (Signed)
Attempted to schedule.  LMOV to call office.  ° °

## 2021-07-03 NOTE — Telephone Encounter (Signed)
Unable to reach to schedule  ?Closing encounter  ?

## 2021-07-05 ENCOUNTER — Ambulatory Visit
Admission: RE | Admit: 2021-07-05 | Discharge: 2021-07-05 | Disposition: A | Discharge status: Home / Self Care | Payer: TRICARE For Life (TFL) | Source: Ambulatory Visit | Attending: Internal Medicine | Admitting: Internal Medicine

## 2021-07-05 DIAGNOSIS — F17210 Nicotine dependence, cigarettes, uncomplicated: Secondary | ICD-10-CM

## 2021-07-16 ENCOUNTER — Other Ambulatory Visit: Payer: Self-pay | Admitting: Family

## 2021-07-26 ENCOUNTER — Other Ambulatory Visit
Admission: RE | Admit: 2021-07-26 | Discharge: 2021-07-26 | Disposition: A | Discharge status: Home / Self Care | Payer: TRICARE For Life (TFL) | Source: Ambulatory Visit | Attending: Family | Admitting: Family

## 2021-07-26 ENCOUNTER — Ambulatory Visit: Payer: Medicare HMO | Attending: Family | Admitting: Family

## 2021-07-26 ENCOUNTER — Encounter: Payer: Self-pay | Admitting: Family

## 2021-07-26 VITALS — BP 165/80 | HR 55 | Resp 16 | Ht 69.0 in | Wt 170.0 lb

## 2021-07-26 DIAGNOSIS — I10 Essential (primary) hypertension: Secondary | ICD-10-CM

## 2021-07-26 DIAGNOSIS — I2510 Atherosclerotic heart disease of native coronary artery without angina pectoris: Secondary | ICD-10-CM | POA: Insufficient documentation

## 2021-07-26 DIAGNOSIS — Z79899 Other long term (current) drug therapy: Secondary | ICD-10-CM | POA: Diagnosis not present

## 2021-07-26 DIAGNOSIS — E119 Type 2 diabetes mellitus without complications: Secondary | ICD-10-CM | POA: Diagnosis not present

## 2021-07-26 DIAGNOSIS — Z7984 Long term (current) use of oral hypoglycemic drugs: Secondary | ICD-10-CM | POA: Diagnosis not present

## 2021-07-26 DIAGNOSIS — F17210 Nicotine dependence, cigarettes, uncomplicated: Secondary | ICD-10-CM | POA: Diagnosis not present

## 2021-07-26 DIAGNOSIS — I110 Hypertensive heart disease with heart failure: Secondary | ICD-10-CM | POA: Diagnosis present

## 2021-07-26 DIAGNOSIS — Z720 Tobacco use: Secondary | ICD-10-CM | POA: Diagnosis not present

## 2021-07-26 DIAGNOSIS — R634 Abnormal weight loss: Secondary | ICD-10-CM | POA: Insufficient documentation

## 2021-07-26 DIAGNOSIS — I5022 Chronic systolic (congestive) heart failure: Secondary | ICD-10-CM | POA: Insufficient documentation

## 2021-07-26 DIAGNOSIS — G4733 Obstructive sleep apnea (adult) (pediatric): Secondary | ICD-10-CM | POA: Insufficient documentation

## 2021-07-26 DIAGNOSIS — E785 Hyperlipidemia, unspecified: Secondary | ICD-10-CM | POA: Insufficient documentation

## 2021-07-26 DIAGNOSIS — Z794 Long term (current) use of insulin: Secondary | ICD-10-CM

## 2021-07-26 LAB — BASIC METABOLIC PANEL
Anion gap: 9 (ref 5–15)
BUN: 23 mg/dL (ref 8–23)
CO2: 25 mmol/L (ref 22–32)
Calcium: 9 mg/dL (ref 8.9–10.3)
Chloride: 103 mmol/L (ref 98–111)
Creatinine, Ser: 1.27 mg/dL — ABNORMAL HIGH (ref 0.61–1.24)
GFR, Estimated: >60 mL/min (ref >60)
Glucose, Bld: 170 mg/dL — ABNORMAL HIGH (ref 70–99)
Potassium: 4.6 mmol/L (ref 3.5–5.1)
Sodium: 137 mmol/L (ref 135–145)

## 2021-07-26 LAB — GLUCOSE, CAPILLARY: Glucose-Capillary: 173 mg/dL — ABNORMAL HIGH (ref 70–99)

## 2021-07-26 NOTE — Progress Notes (Signed)
Patient ID: Alexander Cobb, male    DOB: Dec 13, 1950, 71 y.o.   MRN: [id number]   Alexander Cobb is a 71 y/o male with a history of CAD, DM, hyperlipidemia, HTN, obstructive sleep apnea, current tobacco use and chronic heart failure.   Echo report from 11/10/19 reviewed and showed an EF of 40-45%. Echo report from 06/15/19 reviewed and showed an EF of 45-50% along with moderate LVH and trivial Alexander. Echo report from 05/18/2018 reviewed and showed an EF of 35-40% along with trivial Alexander.  Catheterization done 11/09/19 showed: Prox Cx lesion is 75% stenosed. Mid Cx lesion is 75% stenosed. RPDA lesion is 99% stenosed. Prox RCA lesion is 30% stenosed. Mid RCA lesion is 30% stenosed. A drug-eluting stent was successfully placed using a STENT RESOLUTE ONYX 2.25X30. Post intervention, there is a 0% residual stenosis. Severely depressed overall left ventricular function around 25 to 30% with inferior akinesis left ventricular enlargement   Conclusion LV function is globally depressed at around 25 to 30% with inferior akinesis Coronaries Long left main relatively free of disease LAD is moderate in size relatively free of disease Circumflex is moderate in size with OM1 with tandem 75% lesions RCA was a large vessel with anterior takeoff with mild to moderate disease proximal to mid but the PDA was diffusely diseased focally of around 99% long lesion TIMI-3 flow Successful PCI and stent of PDA with a 2.25 x 30 mm resolute Onyx reducing the lesion from 99 down to 0%   Has not been admitted or been in the ED in the last 6 months.   He presents today for a follow-up visit with a chief complaint of minimal fatigue upon moderate exertion. Describes this as chronic in nature. He has associated cough, shortness of breath, intermittent palpitations and weight loss along with this. Denies any difficulty sleeping, dizziness, abdominal distention, pedal edema, chest pain or weight gain.   He voices concern about his weight  loss. Feels like he's eating very well. Admits that he's quite active at work. Headed to [place] this week for his birthday.   Has not taken his medications yet today but does have them all in a pill box.   Past Medical History:  Diagnosis Date   CAD (coronary artery disease)    a. 12/2010 Cath: nonobs dzs; b. 10/2019 STEMI/PCI: LM nl, LAD nl, LCX 75p/m, RCA 30p, 42md, RPDA 99 (2.25x30 Resolute Onyx DES); c. 11/2019 MV: moderate apical inf fixed defect-->diaphragmatic atten/artiact. No ischemia. EF 51%. Low risk.   Diabetes mellitus without complication ([facility]    HFrEF (heart failure with reduced ejection fraction) ([facility]    a. 07/2010 LV gram: EF 33%; b. 05/2018 Echo: EF 30-35%; c. 05/2018 TEE: EF 35-40%; d. 11/2019 Echo: EF 40-45%, gr1 DD. Antlat and inf HK. Nl RV fxn.   HTN (hypertension) 12/21/2010   Hyperlipidemia    Mixed Ischemic and non-ischemic cardiomyopathy    a. 07/2010 LV gram: EF 33%; b. 05/2018 Echo: EF 30-35%; c. 05/2018 TEE: EF 35-40%; d. 11/2019 Echo: EF 40-45%.   OSA (obstructive sleep apnea)    Uses CPAP at home   PSVT (paroxysmal supraventricular tachycardia) ([facility] 12/21/2010   Tobacco abuse    Past Surgical History:  Procedure Laterality Date   CARDIAC CATHETERIZATION     COLONOSCOPY WITH PROPOFOL N/A 11/07/2015   Procedure: COLONOSCOPY WITH PROPOFOL;  Surgeon: MLollie Sails MD;  [facility]  Service: Endoscopy;  Laterality: N/A;   CORONARY/GRAFT ACUTE MI REVASCULARIZATION N/A  11/09/2019   Procedure: Coronary/Graft Acute MI Revascularization;  Surgeon: Yolonda Kida, MD;  Location: [facility];  Service: Cardiovascular;  Laterality: N/A;   LEFT HEART CATH AND CORONARY ANGIOGRAPHY N/A 11/09/2019   Procedure: LEFT HEART CATH AND CORONARY ANGIOGRAPHY;  Surgeon: Yolonda Kida, MD;  Location: [facility];  Service: Cardiovascular;  Laterality: N/A;   TEE WITHOUT CARDIOVERSION N/A 05/18/2018   Procedure: TRANSESOPHAGEAL ECHOCARDIOGRAM  (TEE);  Surgeon: Wellington Hampshire, MD;  Location: [facility];  Service: Cardiovascular;  Laterality: N/A;   Family History  Problem Relation Age of Onset   COPD Father    Social History   Tobacco Use   Smoking status: Every Day    Packs/day: 0.50    Years: 40.00    Pack years: 20.00    Types: Cigarettes   Smokeless tobacco: Never  Substance Use Topics   Alcohol use: Not Currently   Allergies  Allergen Reactions   Sulfa Drugs Cross Reactors Hives    Hives    Prior to Admission medications   Medication Sig Start Date End Date Taking? Authorizing Provider  atorvastatin (LIPITOR) 80 MG tablet TAKE 1 TABLET BY MOUTH EVERY DAY 12/01/20  Yes Gollan, Kathlene November, MD  carvedilol (COREG) 12.5 MG tablet Take 1 tablet (12.5 mg total) by mouth 2 (two) times daily. 05/22/21 08/20/21 Yes Alisa Graff, FNP  dapagliflozin propanediol (FARXIGA) 10 MG TABS tablet Take 1 tablet (10 mg total) by mouth daily before breakfast. NEEDS HF APPT FOR FURTHER REFILLS 07/16/21  Yes Darylene Price A, FNP  Insulin Glargine (LANTUS SOLOSTAR) 100 UNIT/ML Solostar Pen Inject 15 Units into the skin daily. 05/20/18  Yes Gouru, Illene Silver, MD  Insulin Pen Needle 32G X 4 MM MISC 15 Units by Does not apply route daily at 10 pm. 05/20/18  Yes Gouru, Aruna, MD  losartan (COZAAR) 100 MG tablet Take 1 tablet (100 mg total) by mouth daily. 04/24/21  Yes Darylene Price A, FNP  metFORMIN (GLUCOPHAGE) 500 MG tablet Take 1 tablet (500 mg total) by mouth 2 (two) times daily with a meal. 05/20/18  Yes Gouru, Aruna, MD  nitroGLYCERIN (NITROSTAT) 0.4 MG SL tablet Place 1 tablet (0.4 mg total) under the tongue every 5 (five) minutes as needed for chest pain. 04/15/19 11/22/28 Yes Yehuda Printup, Otila Kluver A, FNP  sertraline (ZOLOFT) 50 MG tablet Take 50 mg by mouth daily.   Yes [provider]  spironolactone (ALDACTONE) 25 MG tablet Take 1 tablet (25 mg total) by mouth daily. 05/22/21 08/20/21 Yes Alisa Graff, FNP  ticagrelor (BRILINTA) 90 MG TABS  tablet Take 1 tablet (90 mg total) by mouth 2 (two) times daily. 12/09/19  Yes Theora Gianotti, NP  aspirin 81 MG tablet Take 81 mg by mouth daily. Reported on 06/30/2015 Patient not taking: Reported on 07/26/2021    [provider]  blood glucose meter kit and supplies KIT Dispense based on patient and insurance preference. Use up to four times daily as directed. (FOR ICD-9 250.00, 250.01). Patient not taking: Reported on 07/26/2021 05/20/18   Nicholes Mango, MD  methimazole (TAPAZOLE) 5 MG tablet Take 2.5 mg by mouth daily. Patient not taking: Reported on 04/24/2021 08/26/19   [provider]  Multiple Vitamin (MULTIVITAMIN) tablet Take 1 tablet by mouth daily. Patient not taking: Reported on 04/24/2021    [provider]   Review of Systems  Constitutional:  Positive for fatigue (with moderate exertion). Negative for appetite change.  HENT:  Negative  for congestion, ear pain, hearing loss, rhinorrhea, sore throat and tinnitus.   Eyes: Negative.  Negative for visual disturbance.  Respiratory:  Positive for cough (productive, associated with smoking) and shortness of breath (associated with smoking).   Cardiovascular:  Positive for palpitations (on occasion). Negative for chest pain and leg swelling.  Gastrointestinal:  Negative for abdominal distention and abdominal pain.  Endocrine: Negative.   Genitourinary: Negative.   Musculoskeletal:  Negative for back pain and neck pain.  Skin: Negative.   Allergic/Immunologic: Negative.   Neurological:  Negative for dizziness, syncope, weakness, light-headedness and headaches.  Hematological:  Negative for adenopathy. Does not bruise/bleed easily.  Psychiatric/Behavioral:  Negative for agitation, dysphoric mood and sleep disturbance (CPAP with 1 pillow under head). The patient is not nervous/anxious.    Vitals:   07/26/21 0908  BP: (!) 165/80  Pulse: (!) 55  Resp: 16  SpO2: 98%  Weight: 170 lb (77.1 kg)  Height: 5'  9" (1.753 m)   Wt Readings from Last 3 Encounters:  07/26/21 170 lb (77.1 kg)  05/22/21 179 lb 4 oz (81.3 kg)  04/24/21 179 lb 9 oz (81.4 kg)   Lab Results  Component Value Date   CREATININE 1.27 (H) 07/26/2021   CREATININE 1.29 (H) 05/31/2021   CREATININE 1.17 05/22/2021   Physical Exam Vitals and nursing note reviewed.  Constitutional:      General: He is not in acute distress.    Appearance: Normal appearance.  HENT:     Head: Normocephalic and atraumatic.     Right Ear: External ear normal.     Left Ear: External ear normal.  Neck:     Comments: - JVD Cardiovascular:     Rate and Rhythm: Normal rate and regular rhythm.     Pulses: Normal pulses.     Heart sounds: Normal heart sounds. No murmur heard.   No gallop.  Pulmonary:     Effort: Pulmonary effort is normal. No respiratory distress.     Breath sounds: Normal breath sounds. No wheezing or rales.  Abdominal:     General: There is no distension.     Palpations: Abdomen is soft.     Tenderness: There is no abdominal tenderness.  Musculoskeletal:        General: No tenderness.     Cervical back: Normal range of motion and neck supple.     Right lower leg: No edema.     Left lower leg: No edema.  Skin:    General: Skin is warm and dry.  Neurological:     General: No focal deficit present.     Mental Status: He is alert and oriented to person, place, and time.  Psychiatric:        Mood and Affect: Mood normal.        Behavior: Behavior normal.        Thought Content: Thought content normal.   Assessment & Plan:  1: Chronic heart failure with reduced ejection fraction- - NYHA class II - euvolemic today - weighing daily; reminded to call for an overnight weight gain of >2 pounds or a weekly weight gain of >5 pounds.  - weight down 9 pounds from last visit here 2 months ago - not adding salt and tries to eat low sodium foods; reviewed the importance of closely following a low sodium diet  - saw cardiology  [name]) 7/22; needs to call to schedule f/u - on GDMT of carvedilol, farxiga, losartan, spironolactone - do BMP today since  he's been on spironolactone for ~ 1 month  2: HTN- - BP elevated (165/80) but he hasn't taken any of his medications yet today because he hasn't eaten breakfast yet; will take his medications upon return home - saw PCP [name]) 06/20/21 - BMP from 05/31/21 reviewed and showed sodium 136, potassium 4.2, creatinine 1.29 and GFR  60  3: DM- - A1C 06/18/21 was 8.1% - on metformin BID - fasting glucose in clinic today was 173  4: Tobacco use- - smoking, 1 PPD, is trying to reduce amount  - cessation discussed for 3 minutes with him  5: Weight loss- - he voices concern about his weight loss and says that he "can't gain any weight" - weight is down 9 pounds from last visit here 2 months ago - very active on his job - encouraged him to try drinking ensure/boost in the evening and f/u with PCP if weight loss continues   Medication bottles reviewed.   Return in 3 months, sooner if needed.

## 2021-07-26 NOTE — Patient Instructions (Signed)
Continue weighing daily and call for an overnight weight gain of 3 pounds or more or a weekly weight gain of more than 5 pounds.   If you have voicemail, please make sure your mailbox is cleaned out so that we may leave a message and please make sure to listen to any voicemails.     

## 2021-08-29 ENCOUNTER — Other Ambulatory Visit: Payer: Self-pay | Admitting: Family

## 2021-09-04 NOTE — Progress Notes (Signed)
Cardiology Clinic Note   Patient Name: Alexander Cobb Date of Encounter: 09/06/2021  Primary Care Provider:  [name], MD Primary Cardiologist:  Ida Rogue, MD  Patient Profile    71 year old male with a history of CAD status post non-STEMI in 2012, status post STEMI/PCI in 10/2019, essential hypertension, hyperlipidemia, mixed ischemic/nonischemic cardiomyopathy,HFrEF, PSVT, diabetes, tobacco use, OSA on CPAP, and Staph aureus bacteremia who presents today for follow-up of CAD.  Past Medical History    Past Medical History:  Diagnosis Date   CAD (coronary artery disease)    a. 12/2010 Cath: nonobs dzs; b. 10/2019 STEMI/PCI: LM nl, LAD nl, LCX 75p/m, RCA 30p, 19md, RPDA 99 (2.25x30 Resolute Onyx DES); c. 11/2019 MV: moderate apical inf fixed defect-->diaphragmatic atten/artiact. No ischemia. EF 51%. Low risk.   Diabetes mellitus without complication ([facility]    HFrEF (heart failure with reduced ejection fraction) ([facility]    a. 07/2010 LV gram: EF 33%; b. 05/2018 Echo: EF 30-35%; c. 05/2018 TEE: EF 35-40%; d. 11/2019 Echo: EF 40-45%, gr1 DD. Antlat and inf HK. Nl RV fxn.   HTN (hypertension) 12/21/2010   Hyperlipidemia    Mixed Ischemic and non-ischemic cardiomyopathy    a. 07/2010 LV gram: EF 33%; b. 05/2018 Echo: EF 30-35%; c. 05/2018 TEE: EF 35-40%; d. 11/2019 Echo: EF 40-45%.   OSA (obstructive sleep apnea)    Uses CPAP at home   PSVT (paroxysmal supraventricular tachycardia) ([facility] 12/21/2010   Tobacco abuse    Past Surgical History:  Procedure Laterality Date   CARDIAC CATHETERIZATION     COLONOSCOPY WITH PROPOFOL N/A 11/07/2015   Procedure: COLONOSCOPY WITH PROPOFOL;  Surgeon: MLollie Sails MD;  [facility];  Service: Endoscopy;  Laterality: N/A;   CORONARY/GRAFT ACUTE MI REVASCULARIZATION N/A 11/09/2019   Procedure: Coronary/Graft Acute MI Revascularization;  Surgeon: CYolonda Kida MD;  [facility];  Service: Cardiovascular;   Laterality: N/A;   LEFT HEART CATH AND CORONARY ANGIOGRAPHY N/A 11/09/2019   Procedure: LEFT HEART CATH AND CORONARY ANGIOGRAPHY;  Surgeon: CYolonda Kida MD;  [facility];  Service: Cardiovascular;  Laterality: N/A;   TEE WITHOUT CARDIOVERSION N/A 05/18/2018   Procedure: TRANSESOPHAGEAL ECHOCARDIOGRAM (TEE);  Surgeon: AWellington Hampshire MD;  Location: [facility];  Service: Cardiovascular;  Laterality: N/A;    Allergies  Allergies  Allergen Reactions   Sulfa Drugs Cross Reactors Hives    Hives     History of Present Illness    71year old male with above past medical history including CAD status post non-STEMI in 2012, status post STEMI/PCI in 10/2019, essential hypertension, hyperlipidemia, mixed ischemic/nonischemic cardiomyopathy,HFrEF, PSVT, diabetes, tobacco use, OSA on CPAP, and Staph aureus bacteremia.  Cardiac history dates back to May 2012 following an in STEMI.  He underwent left heart catheterization which revealed 75% PDA and the mid proximal RCA disease for which he was treated medically, EF at that time was 33% per angiographic.  Follow-up echocardiogram was completed in 10 of 2012 showed an EF of 40 to 45%, which was improved.  According to his chart in 11/2014 he wore an event monitor in the setting of chest pain and palpitations, of note at that time he declined stress testing.  His monitor revealed runs of SVT, but otherwise was unremarkable.  His symptoms had resolved without any interventions.  In March 2020 he was admitted with respiratory failure, congestive heart failure, and MSSA bacteremia requiring CPR and intubation.  TEE was negative for endocarditis  with an EF of 35 to 40%.  At that time he was treated with IV antibiotics.  After his hospitalization he remained stable from the cardiac standpoint, on medical management for CHF with beta-blocker therapy and Entresto until 11/09/2019 when he presented to [facility] emergency department with chest pain and was  found to have an inferior ST elevated myocardial infarction.  He underwent emergent heart catheterization which revealed 99% RPDA disease that was successfully treated with DES.  He had residual, moderate proximal and mid circumflex disease 75% stenosis.  Post PCI echocardiogram showed an EF of 40 to 45%, G1 DD.  He was continued to be maintained on beta-blocker therapy and Entresto.  Spironolactone was added and his Lipitor was increased to 80 mg daily.  He did present for follow-up post PCI on 9/ 3/21 and was doing well overall.  He return to clinic 11/23/2019 following an [place] secondary to an infection and hematoma to the right groin cath site.  He was started on oral antibiotics with improvement.  He was in agreement at that time to obtain a nuclear study to further assess his residual disease.  He underwent Myoview on 12/08/2019 which showed a fixed medium defect in the apical inferior location due to his diaphragmatic attenuation/artifact, no evidence of ischemia, overall low risk study with an EF of 51%.  He was seen in clinic on 12/09/2019 following up after his Myoview he denied chest pain, palpitations, PND, orthopnea nausea vomiting, dizziness syncope and overall stated he was doing well.  He was encouraged to maintain blood pressure log at home with consideration of escalating his GDMT.  On 01/03/2020 he had a lower extremity arterial duplex study as he did complain of some hip discomfort during his evaluation stating it is ongoing for approximately 6 months.  The right leg revealed atherosclerosis in the aorta and iliac arteries with a less than 50% stenosis in the common iliac artery, 50 to 74% stenosis in the common femoral artery and proximal SFA.  Three-vessel runoff.  Left revealed atherosclerosis in the aorta and iliac arteries with greater than 50% stenosis in the distal external iliac artery.  Three-vessel runoff.  He was evaluated in clinic on 01/14/2020 for  management of peripheral arterial disease with the findings of his recent ABIs.  He stated he was doing rather well and he was mostly bothered by right hip pain with walking without cath discomfort and no lower extremity ulcerations were noted.  He was continued on his medications and medical management of his PAD and the importance of smoking cessation.  On 02/08/2020 he presented for routine follow-up stated that again that he was doing fairly well with no significant changes in his medical history.  There were no changes made to his medication there was just a long discussion concerning smoking cessation again as well as stressing the importance of aggressive diabetes control.  That he is back in clinic today accompanied by his wife and states that he feels fairly well.  He has noticed throughout the day that he is having increasing palpitations which in turn causing increased amount of fatigue.  He denies any shortness of breath, chest pain, or dyspnea on exertion, or swelling to his abdomen bilateral lower extremities.  He denies any recent hospitalizations or visits to the emergency department.  Unfortunately he was just recently informed by his insurance that his primary care provider was no longer in network so he is seeking a new  primary care provider to continue management of his diabetes.  He also continues with Darylene Price, NP at  heart failure clinic.  Home Medications    Current Outpatient Medications  Medication Sig Dispense Refill   atorvastatin (LIPITOR) 80 MG tablet TAKE 1 TABLET BY MOUTH EVERY DAY 90 tablet 2   blood glucose meter kit and supplies KIT Dispense based on patient and insurance preference. Use up to four times daily as directed. (FOR ICD-9 250.00, 250.01). 1 each 0   carvedilol (COREG) 12.5 MG tablet Take 1 tablet (12.5 mg total) by mouth 2 (two) times daily. 180 tablet 3   dapagliflozin propanediol (FARXIGA) 10 MG TABS tablet Take 1 tablet (10 mg total) by mouth daily. 30  tablet 5   Insulin Glargine (LANTUS SOLOSTAR) 100 UNIT/ML Solostar Pen Inject 15 Units into the skin daily. 100 mL 1   Insulin Pen Needle 32G X 4 MM MISC 15 Units by Does not apply route daily at 10 pm. 90 each 0   losartan (COZAAR) 100 MG tablet Take 1 tablet (100 mg total) by mouth daily. 90 tablet 3   metFORMIN (GLUCOPHAGE) 500 MG tablet Take 1 tablet (500 mg total) by mouth 2 (two) times daily with a meal. 60 tablet 1   methimazole (TAPAZOLE) 5 MG tablet Take 2.5 mg by mouth daily.     Multiple Vitamin (MULTIVITAMIN) tablet Take 1 tablet by mouth daily.     nitroGLYCERIN (NITROSTAT) 0.4 MG SL tablet Place 1 tablet (0.4 mg total) under the tongue every 5 (five) minutes as needed for chest pain. 90 tablet 3   sertraline (ZOLOFT) 50 MG tablet Take 50 mg by mouth daily.     spironolactone (ALDACTONE) 25 MG tablet Take 1 tablet (25 mg total) by mouth daily. 90 tablet 3   ticagrelor (BRILINTA) 90 MG TABS tablet Take 1 tablet (90 mg total) by mouth 2 (two) times daily. 180 tablet 3   No current facility-administered medications for this visit.     Family History    Family History  Problem Relation Age of Onset   COPD Father    He indicated that his mother is deceased. He indicated that his father is deceased.  Social History    Social History   Socioeconomic History   Marital status: Married    Spouse name: Not on file   Number of children: Not on file   Years of education: Not on file   Highest education level: Not on file  Occupational History   Not on file  Tobacco Use   Smoking status: Every Day    Packs/day: 0.50    Years: 40.00    Total pack years: 20.00    Types: Cigarettes   Smokeless tobacco: Never  Vaping Use   Vaping Use: Never used  Substance and Sexual Activity   Alcohol use: Not Currently   Drug use: No   Sexual activity: Not Currently  Other Topics Concern   Not on file  Social History Narrative   Not on file   Social Determinants of Health    Financial Resource Strain: Low Risk  (01/04/2019)   Overall Financial Resource Strain (CARDIA)    Difficulty of Paying Living Expenses: Not hard at all  Food Insecurity: No Food Insecurity (01/04/2019)   Hunger Vital Sign    Worried About Running Out of Food in the Last Year: Never true    Ran Out of Food in the Last Year: Never true  Transportation Needs: No Transportation  Needs (01/04/2019)   PRAPARE - Hydrologist (Medical): No    Lack of Transportation (Non-Medical): No  Physical Activity: Inactive (01/04/2019)   Exercise Vital Sign    Days of Exercise per Week: 0 days    Minutes of Exercise per Session: 0 min  Stress: No Stress Concern Present (01/04/2019)   [place]    Feeling of Stress : Not at all  Social Connections: Moderately Isolated (01/04/2019)   Social Connection and Isolation Panel [NHANES]    Frequency of Communication with Friends and Family: More than three times a week    Frequency of Social Gatherings with Friends and Family: More than three times a week    Attends Religious Services: 1 to 4 times per year    Active Member of [employer] or Organizations: No    Attends Archivist Meetings: Never    Marital Status: Separated  Intimate Partner Violence: Not At Risk (01/04/2019)   Humiliation, Afraid, Rape, and Kick questionnaire    Fear of Current or Ex-Partner: No    Emotionally Abused: No    Physically Abused: No    Sexually Abused: No     Review of Systems    General:  No chills, fever, night sweats endorses weight changes and fatigue. Cardiovascular:  No chest pain, dyspnea on exertion, edema, orthopnea, paroxysmal nocturnal dyspnea, endorses palpitations. Dermatological: No rash, lesions/masses Respiratory: No cough, dyspnea Urologic: No hematuria, dysuria Abdominal:   No nausea, vomiting, diarrhea, bright red blood per rectum, melena, or  hematemesis Musculoskeletal: Denies claudication to the calves but endorses hip discomfort. Neurologic:  No visual changes, wkns, changes in mental status. All other systems reviewed and are otherwise negative except as noted above.     Physical Exam    VS:  BP 110/70 (BP Location: Left Arm, Patient Position: Sitting, Cuff Size: Normal)   Pulse (!) 59   Ht '5\' 9"'  (1.753 m)   Wt 173 lb (78.5 kg)   SpO2 97%   BMI 25.55 kg/m  , BMI Body mass index is 25.55 kg/m.     GEN: Well nourished, well developed, in no acute distress. HEENT: normal. Neck: Supple, no JVD, carotid bruits, or masses. Cardiac: RRR, no murmurs, rubs, or gallops. No clubbing, cyanosis, edema.  Radials/DP/PT 2+ and equal bilaterally.  Respiratory:  Respirations regular and unlabored, clear to auscultation bilaterally. GI: Soft, nontender, nondistended, BS + x 4. MS: no deformity or atrophy. Skin: warm and dry, no rash. Neuro:  Strength and sensation are intact. Psych: Normal affect.  Accessory Clinical Findings    ECG personally reviewed by me today-sinus bradycardia with a rate of 59, LVH, ST depression with T wave inversion in lead II, III, aVL, aVF, V5 and V6- No acute changes  Lab Results  Component Value Date   WBC 11.9 (H) 11/10/2019   HGB 15.9 11/10/2019   HCT 44.7 11/10/2019   MCV 87.1 11/10/2019   PLT 177 11/10/2019   Lab Results  Component Value Date   CREATININE 1.27 (H) 07/26/2021   BUN 23 07/26/2021   NA 137 07/26/2021   K 4.6 07/26/2021   CL 103 07/26/2021   CO2 25 07/26/2021   Lab Results  Component Value Date   ALT 16 05/16/2018   AST 21 05/16/2018   ALKPHOS 64 05/16/2018   BILITOT 0.9 05/16/2018   Lab Results  Component Value Date   CHOL 138 11/10/2019   HDL 41  11/10/2019   LDLCALC 76 11/10/2019   TRIG 105 11/10/2019   CHOLHDL 3.4 11/10/2019    Lab Results  Component Value Date   HGBA1C 7.6 (H) 11/09/2019    Assessment & Plan   1.  CAD status post inferior STEMI  (11/09/2019) with severe RPDA disease status post DES.  Myoview on 11/07/2019 to evaluate burden of residual disease was low risk, no evidence of ischemia.  Denies any current symptoms of chest pain or dyspnea.  He has continues to work without issues on exertion.  Continue atorvastatin, carvedilol, losartan, spironolactone, and Brilinta with 90-day refill sent in on Brilinta per the patient's request.  2.  Palpitations.  Patient states that he has palpitations throughout the day with associated fatigue.  Denies any shortness of breath or chest discomfort.  TSH was 1.198 and he has upcoming blood work with new PCP.  I have discussed a decrease in his caffeine intake as he still continues to drink caffeinated coffee.  We will also place him on a 14-day ZIO XT monitor.  3.  Mixed ischemic and nonischemic cardiomyopathy/HFrEF with prior history of nonischemic cardiomyopathy EF of 4045% 11/2019.  He is euvolemic on exam today.  We will continue with his carvedilol at 12.5 mg twice daily losartan 100 mg daily, Farxiga 10 mg daily, and spironolactone 25 mg daily.  Patient continues to weigh his self daily and keep a log at home.  4.  Essential hypertension blood pressure today 110/70.  Patient stated blood pressure has never been this well controlled but he had recent initiation of change in medications at heart failure clinic by Darylene Price, NP.  He has been encouraged to continue with carvedilol, losartan, spironolactone.  He is also been encouraged to continue with his blood pressure log at home  5.  Hyperlipidemia with LDL of 23 on 06/18/2021.  Patient advised to continue with atorvastatin.  6.  Tobacco use.  He continues to smoke half a pack per day.  Continue to work on smoking cessation  7.  Type 2 diabetes hemoglobin A1c 8 point 03/20/2021.  He was just informed that his PCP is no longer covered under his insurance and is looking for a new primary care provider.  He did state he was almost out of his  current metformin ER 500 mg that he takes twice daily.  8.  Chronic right hip pain with bilateral femoral bruits and a current smoker.  He had ABIs that showed normal bilaterally there was moderate stenosis in the right, femoral artery with a peak velocity of 265.  Continue to treat a modifiable risk factors especially smoking cessation.  Disposition patient is having ZIO XT monitor placed for his palpitations.  His return appointment to the office would be within 6 weeks.  If ZIO monitor does not hold any findings can repeat TSH and routine labs in his return as well as schedule for repeat echocardiogram.   Teigan Sahli, NP 09/06/2021, 8:41 AM

## 2021-09-06 ENCOUNTER — Ambulatory Visit (INDEPENDENT_AMBULATORY_CARE_PROVIDER_SITE_OTHER): Payer: Medicare HMO

## 2021-09-06 ENCOUNTER — Ambulatory Visit (INDEPENDENT_AMBULATORY_CARE_PROVIDER_SITE_OTHER): Payer: Medicare HMO | Admitting: Cardiology

## 2021-09-06 ENCOUNTER — Encounter: Payer: Self-pay | Admitting: Nurse Practitioner

## 2021-09-06 VITALS — BP 110/70 | HR 59 | Ht 69.0 in | Wt 173.0 lb

## 2021-09-06 DIAGNOSIS — I5022 Chronic systolic (congestive) heart failure: Secondary | ICD-10-CM

## 2021-09-06 DIAGNOSIS — R002 Palpitations: Secondary | ICD-10-CM

## 2021-09-06 DIAGNOSIS — I2510 Atherosclerotic heart disease of native coronary artery without angina pectoris: Secondary | ICD-10-CM | POA: Diagnosis not present

## 2021-09-06 DIAGNOSIS — I255 Ischemic cardiomyopathy: Secondary | ICD-10-CM

## 2021-09-06 DIAGNOSIS — Z720 Tobacco use: Secondary | ICD-10-CM | POA: Diagnosis not present

## 2021-09-06 DIAGNOSIS — I10 Essential (primary) hypertension: Secondary | ICD-10-CM | POA: Diagnosis not present

## 2021-09-06 DIAGNOSIS — E119 Type 2 diabetes mellitus without complications: Secondary | ICD-10-CM

## 2021-09-06 DIAGNOSIS — Z794 Long term (current) use of insulin: Secondary | ICD-10-CM

## 2021-09-06 MED ORDER — TICAGRELOR 90 MG PO TABS: 90.0000 mg | ORAL_TABLET | Freq: Two times a day (BID) | ORAL | 3 refills | Status: DC

## 2021-09-06 NOTE — Addendum Note (Signed)
Addended by: Valora Corporal on: 09/06/2021 09:18 AM   Modules accepted: Orders

## 2021-09-06 NOTE — Patient Instructions (Signed)
Medication Instructions:  No changes at this time.   *If you need a refill on your cardiac medications before your next appointment, please call your pharmacy*   Lab Work: None  If you have labs (blood work) drawn today and your tests are completely normal, you will receive your results only by: [place] (if you have MyChart) OR A paper copy in the mail If you have any lab test that is abnormal or we need to change your treatment, we will call you to review the results.   Testing/Procedures: Your provider has ordered a heart monitor to wear for 14 days. This will be mailed to your home with instructions on placement. Once you have finished the time frame requested, you will return monitor in box provided.      Follow-Up: At [facility], you and your health needs are our priority.  As part of our continuing mission to provide you with exceptional heart care, we have created designated Provider Care Teams.  These Care Teams include your primary Cardiologist (physician) and Advanced Practice Providers (APPs -  Physician Assistants and Nurse Practitioners) who all work together to provide you with the care you need, when you need it.  We recommend signing up for the patient portal called "MyChart".  Sign up information is provided on this After Visit Summary.  MyChart is used to connect with patients for Virtual Visits (Telemedicine).  Patients are able to view lab/test results, encounter notes, upcoming appointments, etc.  Non-urgent messages can be sent to your provider as well.   To learn more about what you can do with MyChart, go to [email or web address].    Your next appointment:   6 week(s)  The format for your next appointment:   In Person  Provider:   Ida Rogue, MD or Murray Hodgkins, NP     Important Information About Sugar

## 2021-09-09 DIAGNOSIS — R002 Palpitations: Secondary | ICD-10-CM

## 2021-09-14 ENCOUNTER — Other Ambulatory Visit: Payer: Self-pay | Admitting: Cardiovascular Disease

## 2021-10-01 ENCOUNTER — Telehealth: Payer: Self-pay | Admitting: Cardiovascular Disease

## 2021-10-01 NOTE — Telephone Encounter (Signed)
   Pre-operative Risk Assessment    Patient Name: Alexander Cobb  DOB: [birth year] MRN: [id number]      Request for Surgical Clearance    Procedure:   Colonoscopy   Date of Surgery:  Clearance 01/04/22                                 Surgeon:  Dr Volney [facility] or Practice Name:  [facility] Phone number:  [phone] Fax number:  [phone]   Type of Clearance Requested:   - Pharmacy:  Hold Ticagrelor (Brilinta) instructions   Type of Anesthesia:  Not Indicated   Additional requests/questions:    [name]   10/01/2021, 4:27 PM

## 2021-10-02 NOTE — Telephone Encounter (Signed)
Left message for the pt to call the office and schedule a IN OFFICE appt with Dr. [name] or APP. Colonoscopy is 01/04/22, IN OFFICE appt no more than 2 months ahead.

## 2021-10-02 NOTE — Telephone Encounter (Signed)
   Name: LEXTON HIDALGO  DOB: [birth year]  MRN: [id number]  Primary Cardiologist: Ida Rogue, MD  Chart reviewed as part of pre-operative protocol coverage. Because of Roi Jafari Townsel's past medical history and time since last visit, he will require a follow-up in-office visit in order to better assess preoperative cardiovascular risk.  Pre-op covering staff: - Please schedule appointment and call patient to inform them. If patient already had an upcoming appointment within acceptable timeframe, please add "pre-op clearance" to the appointment notes so provider is aware. - Please contact requesting surgeon's office via preferred method (i.e, phone, fax) to inform them of need for appointment prior to surgery.   Lenna Sciara, NP  10/02/2021, 4:33 PM

## 2021-10-03 NOTE — Telephone Encounter (Signed)
Pt has appt 11/02/21 with Ignacia Bayley, NP. I have added need pre op clearance to appt notes

## 2021-10-05 ENCOUNTER — Telehealth: Payer: Self-pay | Admitting: Cardiovascular Disease

## 2021-10-05 NOTE — Telephone Encounter (Signed)
   Pre-operative Risk Assessment    Patient Name: Alexander Cobb  DOB: February 13, 1951 MRN: [id number]      Request for Surgical Clearance    Procedure:  Colonoscopy  Date of Surgery:  Clearance 01/04/22                                 Surgeon:  Dr. Volney [facility] or Practice Name:  [facility] Phone number:  [phone] Fax number:  [phone]   Type of Clearance Requested:  Pharmacy, Brilinta    Type of Anesthesia:  Not Indicated   Additional requests/questions:    Signed, Pilar A Ham   10/05/2021, 4:31 PM

## 2021-10-08 NOTE — Telephone Encounter (Signed)
Alexander Cobb was recently seen in the cardiology clinic.  He has a follow-up appointment next month and preoperative cardiac evaluation has already been placed on the appointment note section.  I will defer preoperative cardiac evaluation to his upcoming appointment.  We will remove him from the preoperative cardiac pool.  Jossie Ng. Amalia Edgecombe NP-C     10/08/2021, 9:41 AM [place] [address] Office [phone] Fax [phone]

## 2021-10-26 ENCOUNTER — Ambulatory Visit: Payer: TRICARE For Life (TFL) | Admitting: Family

## 2021-10-26 ENCOUNTER — Telehealth: Payer: Self-pay | Admitting: Family

## 2021-10-26 NOTE — Telephone Encounter (Signed)
Patient did not show for his Heart Failure Clinic appointment on 10/26/21. Will attempt to reschedule.

## 2021-11-02 ENCOUNTER — Ambulatory Visit (INDEPENDENT_AMBULATORY_CARE_PROVIDER_SITE_OTHER): Payer: Medicare HMO | Admitting: Nurse Practitioner

## 2021-11-02 ENCOUNTER — Encounter: Payer: Self-pay | Admitting: Nurse Practitioner

## 2021-11-02 VITALS — BP 130/60 | HR 61 | Ht 69.0 in | Wt 175.0 lb

## 2021-11-02 DIAGNOSIS — I471 Supraventricular tachycardia: Secondary | ICD-10-CM

## 2021-11-02 DIAGNOSIS — E785 Hyperlipidemia, unspecified: Secondary | ICD-10-CM

## 2021-11-02 DIAGNOSIS — Z720 Tobacco use: Secondary | ICD-10-CM

## 2021-11-02 DIAGNOSIS — I255 Ischemic cardiomyopathy: Secondary | ICD-10-CM | POA: Diagnosis not present

## 2021-11-02 DIAGNOSIS — I5022 Chronic systolic (congestive) heart failure: Secondary | ICD-10-CM | POA: Diagnosis not present

## 2021-11-02 DIAGNOSIS — I10 Essential (primary) hypertension: Secondary | ICD-10-CM | POA: Diagnosis not present

## 2021-11-02 DIAGNOSIS — I739 Peripheral vascular disease, unspecified: Secondary | ICD-10-CM

## 2021-11-02 DIAGNOSIS — I2510 Atherosclerotic heart disease of native coronary artery without angina pectoris: Secondary | ICD-10-CM

## 2021-11-02 DIAGNOSIS — E1165 Type 2 diabetes mellitus with hyperglycemia: Secondary | ICD-10-CM

## 2021-11-02 MED ORDER — CARVEDILOL 12.5 MG PO TABS: 12.5000 mg | ORAL_TABLET | Freq: Two times a day (BID) | ORAL | 3 refills | Status: DC

## 2021-11-02 NOTE — Patient Instructions (Signed)
Medication Instructions:  - Your physician recommends that you continue on your current medications as directed. Please refer to the Current Medication list given to you today.  *If you need a refill on your cardiac medications before your next appointment, please call your pharmacy*   Lab Work: - none ordered  If you have labs (blood work) drawn today and your tests are completely normal, you will receive your results only by: [place] (if you have MyChart) OR A paper copy in the mail If you have any lab test that is abnormal or we need to change your treatment, we will call you to review the results.   Testing/Procedures: - none ordered   Follow-Up: At [facility], you and your health needs are our priority.  As part of our continuing mission to provide you with exceptional heart care, we have created designated Provider Care Teams.  These Care Teams include your primary Cardiologist (physician) and Advanced Practice Providers (APPs -  Physician Assistants and Nurse Practitioners) who all work together to provide you with the care you need, when you need it.  We recommend signing up for the patient portal called "MyChart".  Sign up information is provided on this After Visit Summary.  MyChart is used to connect with patients for Virtual Visits (Telemedicine).  Patients are able to view lab/test results, encounter notes, upcoming appointments, etc.  Non-urgent messages can be sent to your provider as well.   To learn more about what you can do with MyChart, go to [email or web address].    Your next appointment:   6 month(s)  The format for your next appointment:   In Person  Provider:   You may see Ida Rogue, MD or one of the following Advanced Practice Providers on your designated Care Team:   Murray Hodgkins, NP Christell Faith, PA-C Cadence Kathlen Mody, [place]    Other Instructions N/a  Important Information About Sugar

## 2021-11-02 NOTE — Progress Notes (Signed)
Office Visit    Patient Name: Alexander Cobb Date of Encounter: 11/02/2021  Primary Care Provider:  [name], MD Primary Cardiologist:  Ida Rogue, MD  Chief Complaint    71 year old male with a history of CAD, hypertension, hyperlipidemia, mixed ischemic and nonischemic cardiomyopathy, HFrEF, PSVT, diabetes, tobacco use, peripheral arterial disease, sleep apnea on CPAP, and Staph aureus bacteremia, who presents for follow-up of CAD and palpitations.  Past Medical History    Past Medical History:  Diagnosis Date   CAD (coronary artery disease)    a. 12/2010 Cath: nonobs dzs; b. 10/2019 STEMI/PCI: LM nl, LAD nl, LCX 75p/m, RCA 30p, 37md, RPDA 99 (2.25x30 Resolute Onyx DES); c. 11/2019 MV: moderate apical inf fixed defect-->diaphragmatic atten/artiact. No ischemia. EF 51%. Low risk.   Diabetes mellitus without complication ([facility]    HFrEF (heart failure with reduced ejection fraction) ([facility]    a. 07/2010 LV gram: EF 33%; b. 05/2018 Echo: EF 30-35%; c. 05/2018 TEE: EF 35-40%; d. 11/2019 Echo: EF 40-45%, gr1 DD. Antlat and inf HK. Nl RV fxn.   HTN (hypertension) 12/21/2010   Hyperlipidemia    Mixed Ischemic and non-ischemic cardiomyopathy    a. 07/2010 LV gram: EF 33%; b. 05/2018 Echo: EF 30-35%; c. 05/2018 TEE: EF 35-40%; d. 11/2019 Echo: EF 40-45%.   OSA (obstructive sleep apnea)    Uses CPAP at home   PAD (peripheral artery disease) (HCC)    PSVT (paroxysmal supraventricular tachycardia) ([facility] 12/21/2010   a. 08/2021 Zio: Predominantly sinus rhythm - avg 64 (50-169). 1 NSVT x 8 beats @ 119. 9 SVT runs, fastest 169, longest 13 beats.  Rare PACs and PVCs.   Tobacco abuse    Past Surgical History:  Procedure Laterality Date   CARDIAC CATHETERIZATION     COLONOSCOPY WITH PROPOFOL N/A 11/07/2015   Procedure: COLONOSCOPY WITH PROPOFOL;  Surgeon: MLollie Sails MD;  [facility];  Service: Endoscopy;  Laterality: N/A;   CORONARY/GRAFT ACUTE MI REVASCULARIZATION  N/A 11/09/2019   Procedure: Coronary/Graft Acute MI Revascularization;  Surgeon: CYolonda Kida MD;  [facility];  Service: Cardiovascular;  Laterality: N/A;   LEFT HEART CATH AND CORONARY ANGIOGRAPHY N/A 11/09/2019   Procedure: LEFT HEART CATH AND CORONARY ANGIOGRAPHY;  Surgeon: CYolonda Kida MD;  [facility];  Service: Cardiovascular;  Laterality: N/A;   TEE WITHOUT CARDIOVERSION N/A 05/18/2018   Procedure: TRANSESOPHAGEAL ECHOCARDIOGRAM (TEE);  Surgeon: AWellington Hampshire MD;  Location: [facility];  Service: Cardiovascular;  Laterality: N/A;    Allergies  Allergies  Allergen Reactions   Sulfa Drugs Cross Reactors Hives    Hives     History of Present Illness    71year old male with the above complex past medical history including CAD, hypertension, hyperlipidemia, mixed ischemic/nonischemic cardiomyopathy, HFrEF, PSVT, peripheral arterial disease, diabetes, tobacco use, sleep apnea on CPAP, and Staph aureus bacteremia.  Cardiac history dates back to May 2012, following a non-STEMI.  He underwent diagnostic catheterization revealing 75% PDA stenosis and mild proximal RCA disease for which he was treated medically.  EF was 33% by left ventriculography with follow-up echo in October 2012 showing an EF of 40 to 45%.  In September 2016, in the setting of chest pain and palpitations, he wore an event monitor which showed runs of SVT.  Symptoms subsequently resolved without intervention.  In March 2020, he was admitted with respiratory failure, CHF, and MSSA bacteremia requiring CPR and intubation.  TEE was negative  for endocarditis.  EF was 35 to 40% and he was treated with intravenous antibiotics.  In late August 2021, he was admitted with chest pain and inferior STEMI.  Diagnostic catheterization revealed severe 99% RPDA disease which was successfully treated with drug-eluting stent.  He had residual, moderate proximal and mid circumflex disease (75%  stenosis).  Post PCI, echo showed an EF of 40 to 45% with grade 1 diastolic dysfunction.  In the setting of residual circumflex disease, in September 2021, he underwent stress testing which showed a fixed medium defect in the apical inferior location due to diaphragmatic attenuation/artifact.  There was no ischemia.  EF was 51%, and he was medically managed.  In the setting of right hip pain, he underwent lower extremity ABIs and duplex in October 2021 which showed 50 to 74% right common femoral arterial disease with less than 50% stenosis in the right common iliac.  Three-vessel runoff was noted.  He had atherosclerotic disease in the aorta and left sided iliac arteries with greater than 50% stenosis in the distal left external iliac artery.  Symptoms were not felt to be consistent with PAD and he was medically managed.  Alexander Cobb was last seen in cardiology clinic on September 06, 2021.  He reported increasing episodes of palpitations with associated fatigue.  He continues to smoke half a pack a day.  A Zio monitor was placed and showed predominantly sinus rhythm with 1, 8 beat run of nonsustained VT (max rate 119 bpm), and 9 brief runs of SVT (longest 13 beats, fastest 169 bpm).  He was advised to continue beta-blocker therapy.  Over the past 6 weeks, Alexander Cobb has been stable.  He reports some degree of chronic dyspnea on exertion, which has not changed in some time.  He does not experience chest pain.  Palpitations occur about once every 1 to 2 weeks, lasts just a few seconds, and resolve spontaneously, similar to what was seen on monitoring.  He does sometimes have mild lightheadedness with episodes of palpitations.  He has never had syncope.  He and his wife often travel on the weekends to go to the beach.  They are going to [place] this weekend.  They usually will walk at the beach, and he is able to walk long distances without chest pain or dyspnea.  He does have chronic right hip pain, which she does not  think is changed since he was previously studied with ABIs.  He denies PND, orthopnea, edema, or early satiety.  Home Medications    Current Outpatient Medications  Medication Sig Dispense Refill   atorvastatin (LIPITOR) 80 MG tablet TAKE 1 TABLET BY MOUTH EVERY DAY 90 tablet 3   blood glucose meter kit and supplies KIT Dispense based on patient and insurance preference. Use up to four times daily as directed. (FOR ICD-9 250.00, 250.01). 1 each 0   dapagliflozin propanediol (FARXIGA) 10 MG TABS tablet Take 1 tablet (10 mg total) by mouth daily. 30 tablet 5   Insulin Glargine (LANTUS SOLOSTAR) 100 UNIT/ML Solostar Pen Inject 15 Units into the skin daily. 100 mL 1   Insulin Pen Needle 32G X 4 MM MISC 15 Units by Does not apply route daily at 10 pm. 90 each 0   losartan (COZAAR) 100 MG tablet Take 1 tablet (100 mg total) by mouth daily. 90 tablet 3   metFORMIN (GLUCOPHAGE) 500 MG tablet Take 1 tablet (500 mg total) by mouth 2 (two) times daily with a meal. 60 tablet 1  methimazole (TAPAZOLE) 5 MG tablet Take 2.5 mg by mouth daily.     Multiple Vitamin (MULTIVITAMIN) tablet Take 1 tablet by mouth daily.     nitroGLYCERIN (NITROSTAT) 0.4 MG SL tablet Place 1 tablet (0.4 mg total) under the tongue every 5 (five) minutes as needed for chest pain. 90 tablet 3   sertraline (ZOLOFT) 50 MG tablet Take 50 mg by mouth daily.     spironolactone (ALDACTONE) 25 MG tablet Take 1 tablet (25 mg total) by mouth daily. 90 tablet 3   ticagrelor (BRILINTA) 90 MG TABS tablet Take 1 tablet (90 mg total) by mouth 2 (two) times daily. 180 tablet 3   carvedilol (COREG) 12.5 MG tablet Take 1 tablet (12.5 mg total) by mouth 2 (two) times daily. 180 tablet 3   No current facility-administered medications for this visit.     Review of Systems    Some degree of chronic dyspnea on exertion which is unchanged.  Occasional palpitations with mild lightheadedness.  These episodes are brief.  He denies chest pain, PND,  orthopnea, syncope, edema, or early satiety.  All other systems reviewed and are otherwise negative except as noted above.    Physical Exam    VS:  BP 130/60 (BP Location: Left Arm, Patient Position: Sitting, Cuff Size: Normal)   Pulse 61   Ht '5\' 9"'  (1.753 m)   Wt 175 lb (79.4 kg)   BMI 25.84 kg/m  , BMI Body mass index is 25.84 kg/m.     GEN: Well nourished, well developed, in no acute distress. HEENT: normal. Neck: Supple, no JVD, carotid bruits, or masses. Cardiac: RRR, no murmurs, rubs, or gallops. No clubbing, cyanosis, edema.  Radials 2+ and equal bilaterally.  Unable to assess lower extremity distal pulses-patient wearing tall boots and preferred to leave on. Respiratory:  Respirations regular and unlabored, diminished breath sounds bilaterally. GI: Soft, nontender, nondistended, BS + x 4. MS: no deformity or atrophy. Skin: warm and dry, no rash. Neuro:  Strength and sensation are intact. Psych: Normal affect.  Accessory Clinical Findings    ECG personally reviewed by me today -regular sinus rhythm, 61, lateral T wave inversion- no acute changes.  Lab Results  Component Value Date   WBC 11.9 (H) 11/10/2019   HGB 15.9 11/10/2019   HCT 44.7 11/10/2019   MCV 87.1 11/10/2019   PLT 177 11/10/2019   Lab Results  Component Value Date   CREATININE 1.27 (H) 07/26/2021   BUN 23 07/26/2021   NA 137 07/26/2021   K 4.6 07/26/2021   CL 103 07/26/2021   CO2 25 07/26/2021   Lab Results  Component Value Date   ALT 16 05/16/2018   AST 21 05/16/2018   ALKPHOS 64 05/16/2018   BILITOT 0.9 05/16/2018   Lab Results  Component Value Date   CHOL 138 11/10/2019   HDL 41 11/10/2019   LDLCALC 76 11/10/2019   TRIG 105 11/10/2019   CHOLHDL 3.4 11/10/2019    Lab Results  Component Value Date   HGBA1C 7.6 (H) 11/09/2019   Labs from care everywhere dated June 18, 2021  Total cholesterol 83, triglycerides 108, HDL 38.6, LDL 23 Total protein 6.8, albumin 4.2, total bilirubin  0.6, alkaline phosphatase 54, AST 12, ALT 14 Hemoglobin A1c 8.1 Sodium 135, potassium 4.3, chloride 102, CO2 26.3, BUN 20, creatinine 1.3, glucose 160  Assessment & Plan    1.  Coronary artery disease/preprocedure cardiovascular examination: Status post inferior STEMI and drug-eluting stent placement to the RPDA  in August 2021.  Residual circumflex disease with no significant ischemia on stress testing in September 2021.  He has some degree of chronic dyspnea on exertion, which he attributes to smoking, though this has been stable over several years.  He does not experience chest pain.  He remains on aspirin, Brilinta, beta-blocker, ARB, and statin therapy.  He is pending a colonoscopy in October.  With history of CAD, MI, and CHF, perioperative risk of cardiac events calculates to 11%.  With good activity tolerance and in the absence of recent symptoms or objective evidence of ischemia with low risk stress test in September 2021, he may proceed to colonoscopy without additional ischemic evaluation.  Though he may hold Brilinta for 5 days prior to colonoscopy, he should remain on aspirin throughout the periprocedural period and Brilinta should be resumed as soon as felt to be feasible by the operative team.    2.  Mixed ischemic and nonischemic cardiomyopathy/HFrEF: EF 40-45% by echo in September 2021.  He remains euvolemic and has been doing well with chronic, stable dyspnea on exertion that does not significantly limit his activity.  He remains on beta-blocker, ARB, Farxiga, and spironolactone therapy.  He had stable renal function by labs in April.  3.  Palpitations/PSVT: Recent ZIO monitor in the setting of increased frequency of palpitations.  This did reveal 9 brief SVT runs up to 169 bpm with the longest episode lasting 13 beats.  He otherwise had rare PACs and PVCs as well as 1 brief run of nonsustained VT.  He notes symptomatic palpitations symptoms associate with lightheadedness occurring about  once every 2 weeks or so.  No known sustained symptoms.  Continue carvedilol therapy.  4.  Essential hypertension: Stable today at 130/60.  He remains on beta-blocker, ARB, and spironolactone.  5.  Hyperlipidemia: LDL of 23 in April.  Normal LFTs at that time.  Continue statin therapy.  6.  Type 2 diabetes mellitus: A1c 8.1 in April.  He is in the process of arranging for a new primary care provider secondary to insurance changes.  He remains on Lantus, Farxiga, metformin, ARB, and statin.  7.  Chronic right hip pain/peripheral arterial disease: Previous ABIs in 2021 showed moderate right common iliac and right common femoral disease.  Left external iliac disease also noted.  He has chronic right hip pain, which was not felt to be consistent with PAD in the past.  No change in symptoms.  He has reasonable activity tolerance and walks frequently with his wife.  Continue aspirin and statin therapy.  He continues to smoke half pack a day and complete cessation advised.  We did discuss follow-up ABIs as it has been 2 years and at this time, he wishes to defer.  8.  Tobacco abuse: Still smoking half a pack a day.  Complete cessation advised.  9.  Disposition: Patient has follow-up with heart failure clinic next month.  We will plan to see back here in 6 months or sooner if necessary.  Murray Hodgkins, NP 11/02/2021, 9:29 AM

## 2021-11-25 NOTE — Progress Notes (Deleted)
Patient ID: ROMIN DIVITA, male    DOB: January 28, 1951, 71 y.o.   MRN: [id number]   Mr Welliver is a 71 y/o male with a history of CAD, DM, hyperlipidemia, HTN, obstructive sleep apnea, current tobacco use and chronic heart failure.   Echo report from 11/10/19 reviewed and showed an EF of 40-45%. Echo report from 06/15/19 reviewed and showed an EF of 45-50% along with moderate LVH and trivial MR. Echo report from 05/18/2018 reviewed and showed an EF of 35-40% along with trivial MR.  Catheterization done 11/09/19 showed: Prox Cx lesion is 75% stenosed. Mid Cx lesion is 75% stenosed. RPDA lesion is 99% stenosed. Prox RCA lesion is 30% stenosed. Mid RCA lesion is 30% stenosed. A drug-eluting stent was successfully placed using a STENT RESOLUTE ONYX 2.25X30. Post intervention, there is a 0% residual stenosis. Severely depressed overall left ventricular function around 25 to 30% with inferior akinesis left ventricular enlargement   Conclusion LV function is globally depressed at around 25 to 30% with inferior akinesis Coronaries Long left main relatively free of disease LAD is moderate in size relatively free of disease Circumflex is moderate in size with OM1 with tandem 75% lesions RCA was a large vessel with anterior takeoff with mild to moderate disease proximal to mid but the PDA was diffusely diseased focally of around 99% long lesion TIMI-3 flow Successful PCI and stent of PDA with a 2.25 x 30 mm resolute Onyx reducing the lesion from 99 down to 0%   Has not been admitted or been in the ED in the last 6 months.   He presents today for a follow-up visit with a chief complaint of   Past Medical History:  Diagnosis Date   CAD (coronary artery disease)    a. 12/2010 Cath: nonobs dzs; b. 10/2019 STEMI/PCI: LM nl, LAD nl, LCX 75p/m, RCA 30p, 67md, RPDA 99 (2.25x30 Resolute Onyx DES); c. 11/2019 MV: moderate apical inf fixed defect-->diaphragmatic atten/artiact. No ischemia. EF 51%. Low risk.    Diabetes mellitus without complication ([facility]    [place]FrEF (heart failure with reduced ejection fraction) ([facility]    a. 07/2010 LV gram: EF 33%; b. 05/2018 Echo: EF 30-35%; c. 05/2018 TEE: EF 35-40%; d. 11/2019 Echo: EF 40-45%, gr1 DD. Antlat and inf HK. Nl RV fxn.   HTN (hypertension) 12/21/2010   Hyperlipidemia    Mixed Ischemic and non-ischemic cardiomyopathy    a. 07/2010 LV gram: EF 33%; b. 05/2018 Echo: EF 30-35%; c. 05/2018 TEE: EF 35-40%; d. 11/2019 Echo: EF 40-45%.   OSA (obstructive sleep apnea)    Uses CPAP at home   PAD (peripheral artery disease) (HCC)    PSVT (paroxysmal [place]ricular tachycardia) ([facility] 12/21/2010   a. 08/2021 Zio: Predominantly sinus rhythm - avg 64 (50-169). 1 NSVT x 8 beats @ 119. 9 SVT runs, fastest 169, longest 13 beats.  Rare PACs and PVCs.   Tobacco abuse    Past Surgical History:  Procedure Laterality Date   CARDIAC CATHETERIZATION     COLONOSCOPY WITH PROPOFOL N/A 11/07/2015   Procedure: COLONOSCOPY WITH PROPOFOL;  Surgeon: MLollie Sails MD;  [facility];  Service: Endoscopy;  Laterality: N/A;   CORONARY/GRAFT ACUTE MI REVASCULARIZATION N/A 11/09/2019   Procedure: Coronary/Graft Acute MI Revascularization;  Surgeon: CYolonda Kida MD;  [facility];  Service: Cardiovascular;  Laterality: N/A;   LEFT HEART CATH AND CORONARY ANGIOGRAPHY N/A 11/09/2019   Procedure: LEFT HEART CATH AND CORONARY ANGIOGRAPHY;  Surgeon: CLujean Amel  D, MD;  Location: [place] [facility];  Service: Cardiovascular;  Laterality: N/A;   TEE WITHOUT CARDIOVERSION N/A 05/18/2018   Procedure: TRANSESOPHAGEAL ECHOCARDIOGRAM (TEE);  Surgeon: Wellington Hampshire, MD;  Location: [facility]S;  Service: Cardiovascular;  Laterality: N/A;   Family History  Problem Relation Age of Onset   COPD Father    Social History   Tobacco Use   Smoking status: Every Day    Packs/day: 0.50    Years: 40.00    Total pack years: 20.00    Types: Cigarettes   Smokeless  tobacco: Never  Substance Use Topics   Alcohol use: Not Currently   Allergies  Allergen Reactions   Sulfa Drugs Cross Reactors Hives    Hives     Review of Systems  Constitutional:  Positive for fatigue (with moderate exertion). Negative for appetite change.  HENT:  Negative for congestion, ear pain, hearing loss, rhinorrhea, sore throat and tinnitus.   Eyes: Negative.  Negative for visual disturbance.  Respiratory:  Positive for cough (productive, associated with smoking) and shortness of breath (associated with smoking).   Cardiovascular:  Positive for palpitations (on occasion). Negative for chest pain and leg swelling.  Gastrointestinal:  Negative for abdominal distention and abdominal pain.  Endocrine: Negative.   Genitourinary: Negative.   Musculoskeletal:  Negative for back pain and neck pain.  Skin: Negative.   Allergic/Immunologic: Negative.   Neurological:  Negative for dizziness, syncope, weakness, light-headedness and headaches.  Hematological:  Negative for adenopathy. Does not bruise/bleed easily.  Psychiatric/Behavioral:  Negative for agitation, dysphoric mood and sleep disturbance (CPAP with 1 pillow under head). The patient is not nervous/anxious.       Physical Exam Vitals and nursing note reviewed.  Constitutional:      General: He is not in acute distress.    Appearance: Normal appearance.  HENT:     Head: Normocephalic and atraumatic.     Right Ear: External ear normal.     Left Ear: External ear normal.  Neck:     Comments: - JVD Cardiovascular:     Rate and Rhythm: Normal rate and regular rhythm.     Pulses: Normal pulses.     Heart sounds: Normal heart sounds. No murmur heard.    No gallop.  Pulmonary:     Effort: Pulmonary effort is normal. No respiratory distress.     Breath sounds: Normal breath sounds. No wheezing or rales.  Abdominal:     General: There is no distension.     Palpations: Abdomen is soft.     Tenderness: There is no  abdominal tenderness.  Musculoskeletal:        General: No tenderness.     Cervical back: Normal range of motion and neck supple.     Right lower leg: No edema.     Left lower leg: No edema.  Skin:    General: Skin is warm and dry.  Neurological:     General: No focal deficit present.     Mental Status: He is alert and oriented to person, place, and time.  Psychiatric:        Mood and Affect: Mood normal.        Behavior: Behavior normal.        Thought Content: Thought content normal.    Assessment & Plan:  1: Chronic heart failure with reduced ejection fraction- - NYHA class II - euvolemic today - weighing daily; reminded to call for an overnight weight gain of >2 pounds  or a weekly weight gain of >5 pounds.  - weight 170 pounds from last visit here 4 months ago - not adding salt and tries to eat low sodium foods; reviewed the importance of closely following a low sodium diet  - saw cardiology Sharolyn Douglas) 11/02/21 - on GDMT of carvedilol, farxiga, losartan, spironolactone   2: HTN- - BP  - saw PCP [name]) 11/22/21 - BMP from 07/26/21 reviewed and showed sodium 137, potassium 4.6, creatinine 1.27 and GFR > 60  3: DM- - A1C 11/15/21 was 8.8% - on metformin BID  4: Tobacco use- - smoking, 1 PPD, is trying to reduce amount  - cessation discussed for 3 minutes with him  5: Weight loss- - saw GI Chauncey Mann) 10/01/21 - colonoscopy scheduled for 01/04/22   Medication bottles reviewed.

## 2021-11-26 ENCOUNTER — Ambulatory Visit: Payer: Medicare HMO | Admitting: Family

## 2021-11-26 ENCOUNTER — Telehealth: Payer: Self-pay | Admitting: Family

## 2021-11-26 NOTE — Telephone Encounter (Signed)
Patient did not show for his Heart Failure Clinic appointment on 11/26/21. Will attempt to reschedule.

## 2021-12-19 ENCOUNTER — Telehealth: Payer: Self-pay | Admitting: Cardiovascular Disease

## 2021-12-19 NOTE — Telephone Encounter (Signed)
   Patient Name: Alexander Cobb  DOB: 1950-07-14 MRN: [id number]  Primary Cardiologist: Ida Rogue, MD  Chart reviewed as part of pre-operative protocol coverage. Given past medical history and time since last visit, based on ACC/AHA guidelines, Alexander Cobb is at acceptable risk for the planned procedure without further cardiovascular testing.   As previously noted @ office visit on 11/02/2021, he may hold Brilinta therapy for five days prior to his planned colonoscopy, though we do recommend continuation of low dose aspirin throughout the peri-procedural period.  I will route this recommendation to the requesting party via Epic fax function and remove from pre-op pool.  Please call with questions.  Murray Hodgkins, NP 12/19/2021, 1:29 PM

## 2021-12-19 NOTE — Telephone Encounter (Signed)
   Pre-operative Risk Assessment    Patient Name: Alexander Cobb  DOB: [birth year] MRN: [id number]{     Request for Surgical Clearance    Procedure:   COLONOSCOPY { Date of Surgery:  Clearance 01/04/22                                Surgeon:  DR Volney [facility] Name:  [facility] Phone number:  [phone] Fax number:  [phone]  Type of Clearance Requested:   - Medical   PHARMACY-BRILIN[facility] TO BE D/C PREOPFOR THE INDICATED TIMES IN ACCORDANCE WITH ASGE GUIDELINES Type of Anesthesia:  Not Indicated   Additional requests/questions:    Signed, Eli Phillips   12/19/2021, 12:38 PM

## 2022-01-03 NOTE — H&P (Signed)
Pre-Procedure H&P   Patient ID: Alexander Cobb is a 71 y.o. adult.  Gastroenterology Provider: Annamaria Helling, DO  Referring Provider: Octavia Bruckner, PA PCP: [name], MD  Date: 01/04/2022  HPI [name] is a 71 y.o. adult who presents today for Colonoscopy for Surveillance-Personal history of colon polyps.  Patient last underwent colonoscopy in August 2017 with 4 tubular adenomatous polyps all less than 10 mm.  A ascending colon submucosally myoma was also biopsied.  Left-sided diverticulosis at that time was appreciated. Bowel movements have been regular without melena or hematochezia.  Evaluated by his cardiologist who restratified him as low and agreed to [facility] hold of 5 days.  No family history colon cancer or colon polyps Still smoking tobacco daily.  Sleep apnea with CPAP use.  No pacemaker or defibrillator.  Has not recently required nitro  Creatinine 1.2 A1c 8.8 STEMI in 2021 status post PCI with DES   Past Medical History:  Diagnosis Date   CAD (coronary artery disease)    a. 12/2010 Cath: nonobs dzs; b. 10/2019 STEMI/PCI: LM nl, LAD nl, LCX 75p/m, RCA 30p, 58md, RPDA 99 (2.25x30 Resolute Onyx DES); c. 11/2019 MV: moderate apical inf fixed defect-->diaphragmatic atten/artiact. No ischemia. EF 51%. Low risk.   Di[place] mellitus [place] complication [place]    [place]FrEF (heart failure with reduced ejection fraction) [place]    a. 07/2010 LV gram: EF 33%; b. 05/2018 Echo: EF 30-35%; c. 05/2018 TEE: EF 35-40%; d. 11/2019 Echo: EF 40-45%, gr1 DD. Antlat and inf HK. Nl RV fxn.   HTN (hypertension) 12/21/2010   Hyperlipidemia    Mixed Ischemic and non-ischemic cardiomyopathy    a. 07/2010 LV gram: EF 33%; b. 05/2018 Echo: EF 30-35%; c. 05/2018 TEE: EF 35-40%; d. 11/2019 Echo: EF 40-45%.   OSA (obstructive sleep apnea)    Uses CPAP at home   PAD (peripheral artery disease) (HCC)    PSVT (paroxysmal supraventricular tachycardia) 12/21/2010   a. 08/2021 Zio:  Predominantly sinus rhythm - avg 64 (50-169). 1 NSVT x 8 beats @ 119. 9 SVT runs, fastest 169, longest 13 beats.  Rare PACs and PVCs.   Tobacco abuse     Past Surgical History:  Procedure Laterality Date   CARDIAC CATHETERIZATION     COLONOSCOPY WITH PROPOFOL N/A 11/07/2015   Procedure: COLONOSCOPY WITH PROPOFOL;  Surgeon: MLollie Sails MD;  [facility];  Service: Endoscopy;  Laterality: N/A;   CORONARY ANGIOPLASTY     CORONARY/GRAFT ACUTE MI REVASCULARIZATION N/A 11/09/2019   Procedure: Coronary/Graft Acute MI Revascularization;  Surgeon: CYolonda Kida MD;  [facility];  Service: Cardiovascular;  Laterality: N/A;   LEFT HEART CATH AND CORONARY ANGIOGRAPHY N/A 11/09/2019   Procedure: LEFT HEART CATH AND CORONARY ANGIOGRAPHY;  Surgeon: CYolonda Kida MD;  [facility];  Service: Cardiovascular;  Laterality: N/A;   TEE WITHOUT CARDIOVERSION N/A 05/18/2018   Procedure: TRANSESOPHAGEAL ECHOCARDIOGRAM (TEE);  Surgeon: AWellington Hampshire MD;  Location: [facility] ORS;  Service: Cardiovascular;  Laterality: N/A;    Family History No h/o GI disease or malignancy  Review of Systems  Constitutional:  Negative for activity change, appetite change, chills, diaphoresis, fatigue, fever and unexpected weight change.  HENT:  Negative for trouble swallowing and voice change.   Respiratory:  Negative for shortness of breath and wheezing.   Cardiovascular:  Negative for chest pain, palpitations and leg swelling.  Gastrointestinal:  Negative for abdominal distention, abdominal pain, anal  bleeding, blood in stool, constipation, diarrhea, nausea and vomiting.  Musculoskeletal:  Negative for arthralgias and myalgias.  Skin:  Negative for color change and pallor.  Neurological:  Negative for dizziness, syncope and weakness.  Psychiatric/Behavioral:  Negative for confusion. The patient is not nervous/anxious.   All other systems reviewed and are  negative.    Medications No current facility-administered medications on file prior to encounter.   Current Outpatient Medications on File Prior to Encounter  Medication Sig Dispense Refill   atorvastatin (LIPITOR) 80 MG tablet TAKE 1 TABLET BY MOUTH EVERY DAY 90 tablet 3   blood glucose meter kit and supplies KIT Dispense based on patient and insurance preference. Use up to four times daily as directed. (FOR ICD-9 250.00, 250.01). 1 each 0   dapagliflozin propanediol (FARXIGA) 10 MG TABS tablet Take 1 tablet (10 mg total) by mouth daily. 30 tablet 5   Insulin Glargine (LANTUS SOLOSTAR) 100 UNIT/ML Solostar Pen Inject 15 Units into the skin daily. 100 mL 1   Insulin Pen Needle 32G X 4 MM MISC 15 Units by Does not apply route daily at 10 pm. 90 each 0   losartan (COZAAR) 100 MG tablet Take 1 tablet (100 mg total) by mouth daily. 90 tablet 3   metFORMIN (GLUCOPHAGE) 500 MG tablet Take 1 tablet (500 mg total) by mouth 2 (two) times daily with a meal. 60 tablet 1   methimazole (TAPAZOLE) 5 MG tablet Take 2.5 mg by mouth daily.     Multiple Vitamin (MULTIVITAMIN) tablet Take 1 tablet by mouth daily.     sertraline (ZOLOFT) 50 MG tablet Take 50 mg by mouth daily.     nitroGLYCERIN (NITROSTAT) 0.4 MG SL tablet Place 1 tablet (0.4 mg total) under the tongue every 5 (five) minutes as needed for chest pain. 90 tablet 3   spironolactone (ALDACTONE) 25 MG tablet Take 1 tablet (25 mg total) by mouth daily. 90 tablet 3   ticagrelor (BRILINTA) 90 MG TABS tablet Take 1 tablet (90 mg total) by mouth 2 (two) times daily. 180 tablet 3    Pertinent medications related to GI and procedure were reviewed by me with the patient prior to the procedure   Current Facility-Administered Medications:    0.9 %  sodium chloride infusion, , Intravenous, Continuous, Annamaria Helling, DO, Last Rate: 20 mL/hr at 01/04/22 0728, 1,000 mL at 01/04/22 1740      Allergies  Allergen Reactions   Sulfa Drugs Cross  Reactors Hives    Hives    Allergies were reviewed by me prior to the procedure  Objective   Body mass index is 25.16 kg/m. Vitals:   01/04/22 0711  BP: (!) 141/78  Pulse: 60  Resp: 20  Temp: (!) 96.5 F (35.8 C)  TempSrc: Temporal  SpO2: 95%  Weight: 77.3 kg  Height: '5\' 9"'  (1.753 m)     Physical Exam Vitals and nursing note reviewed.  Constitutional:      General: She is not in acute distress.    Appearance: Normal appearance. She is not ill-appearing, toxic-appearing or diaphoretic.  HENT:     Head: Normocephalic and atraumatic.     Nose: Nose normal.     Mouth/Throat:     Mouth: Mucous membranes are moist.     Pharynx: Oropharynx is clear.  Eyes:     General: No scleral icterus.    Extraocular Movements: Extraocular movements intact.  Cardiovascular:     Rate and Rhythm: Normal rate and regular rhythm.  Heart sounds: Normal heart sounds. No murmur heard.    No friction rub. No gallop.  Pulmonary:     Effort: Pulmonary effort is normal. No respiratory distress.     Breath sounds: Normal breath sounds. No wheezing, rhonchi or rales.  Abdominal:     General: Bowel sounds are normal. There is no distension.     Palpations: Abdomen is soft.     Tenderness: There is no abdominal tenderness. There is no guarding or rebound.  Musculoskeletal:     Cervical back: Neck supple.     Right lower leg: No edema.     Left lower leg: No edema.  Skin:    General: Skin is warm and dry.     Coloration: Skin is not jaundiced or pale.  Neurological:     General: No focal deficit present.     Mental Status: She is alert and oriented to person, place, and time. Mental status is at baseline.  Psychiatric:        Mood and Affect: Mood normal.        Behavior: Behavior normal.        Thought Content: Thought content normal.        Judgment: Judgment normal.      Assessment:  [name] is a 70 y.o. adult  who presents today for Colonoscopy for  Surveillance-Personal history of colon polyps.  Plan:  Colonoscopy with possible intervention today  Colonoscopy with possible biopsy, control of bleeding, polypectomy, and interventions as necessary has been discussed with the patient/patient representative. Informed consent was obtained from the patient/patient representative after explaining the indication, nature, and risks of the procedure including but not limited to death, bleeding, perforation, missed neoplasm/lesions, cardiorespiratory compromise, and reaction to medications. Opportunity for questions was given and appropriate answers were provided. Patient/patient representative has verbalized understanding is amenable to undergoing the procedure.   Annamaria Helling, DO  [facility]  Portions of the record may have been created with voice recognition software. Occasional wrong-word or 'sound-a-like' substitutions may have occurred due to the inherent limitations of voice recognition software.  Read the chart carefully and recognize, using context, where substitutions may have occurred.

## 2022-01-04 ENCOUNTER — Encounter: Payer: Self-pay | Admitting: Gastroenterology

## 2022-01-04 ENCOUNTER — Ambulatory Visit: Payer: Medicare HMO | Admitting: Anesthesiology

## 2022-01-04 ENCOUNTER — Ambulatory Visit
Admission: RE | Admit: 2022-01-04 | Discharge: 2022-01-04 | Disposition: A | Discharge status: Home / Self Care | Payer: Medicare HMO | Attending: Gastroenterology | Admitting: Gastroenterology

## 2022-01-04 ENCOUNTER — Encounter
Admission: RE | Disposition: A | Discharge status: Home / Self Care | Payer: Self-pay | Source: Home / Self Care | Attending: Gastroenterology

## 2022-01-04 DIAGNOSIS — K602 Anal fissure, unspecified: Secondary | ICD-10-CM | POA: Diagnosis not present

## 2022-01-04 DIAGNOSIS — I428 Other cardiomyopathies: Secondary | ICD-10-CM | POA: Diagnosis not present

## 2022-01-04 DIAGNOSIS — K635 Polyp of colon: Secondary | ICD-10-CM | POA: Diagnosis not present

## 2022-01-04 DIAGNOSIS — D128 Benign neoplasm of rectum: Secondary | ICD-10-CM | POA: Insufficient documentation

## 2022-01-04 DIAGNOSIS — I509 Heart failure, unspecified: Secondary | ICD-10-CM | POA: Diagnosis not present

## 2022-01-04 DIAGNOSIS — F17200 Nicotine dependence, unspecified, uncomplicated: Secondary | ICD-10-CM | POA: Insufficient documentation

## 2022-01-04 DIAGNOSIS — E1151 Type 2 diabetes mellitus with diabetic peripheral angiopathy without gangrene: Secondary | ICD-10-CM | POA: Diagnosis not present

## 2022-01-04 DIAGNOSIS — K5520 Angiodysplasia of colon without hemorrhage: Secondary | ICD-10-CM | POA: Diagnosis not present

## 2022-01-04 DIAGNOSIS — Z955 Presence of coronary angioplasty implant and graft: Secondary | ICD-10-CM | POA: Insufficient documentation

## 2022-01-04 DIAGNOSIS — Z7902 Long term (current) use of antithrombotics/antiplatelets: Secondary | ICD-10-CM | POA: Diagnosis not present

## 2022-01-04 DIAGNOSIS — Z794 Long term (current) use of insulin: Secondary | ICD-10-CM | POA: Diagnosis not present

## 2022-01-04 DIAGNOSIS — I2510 Atherosclerotic heart disease of native coronary artery without angina pectoris: Secondary | ICD-10-CM | POA: Insufficient documentation

## 2022-01-04 DIAGNOSIS — K640 First degree hemorrhoids: Secondary | ICD-10-CM | POA: Diagnosis not present

## 2022-01-04 DIAGNOSIS — I110 Hypertensive heart disease with heart failure: Secondary | ICD-10-CM | POA: Diagnosis not present

## 2022-01-04 DIAGNOSIS — Z1211 Encounter for screening for malignant neoplasm of colon: Secondary | ICD-10-CM | POA: Insufficient documentation

## 2022-01-04 DIAGNOSIS — G4733 Obstructive sleep apnea (adult) (pediatric): Secondary | ICD-10-CM | POA: Diagnosis not present

## 2022-01-04 DIAGNOSIS — Z7984 Long term (current) use of oral hypoglycemic drugs: Secondary | ICD-10-CM | POA: Diagnosis not present

## 2022-01-04 DIAGNOSIS — D123 Benign neoplasm of transverse colon: Secondary | ICD-10-CM | POA: Insufficient documentation

## 2022-01-04 DIAGNOSIS — I252 Old myocardial infarction: Secondary | ICD-10-CM | POA: Insufficient documentation

## 2022-01-04 DIAGNOSIS — G4730 Sleep apnea, unspecified: Secondary | ICD-10-CM | POA: Insufficient documentation

## 2022-01-04 DIAGNOSIS — E785 Hyperlipidemia, unspecified: Secondary | ICD-10-CM | POA: Diagnosis not present

## 2022-01-04 DIAGNOSIS — I255 Ischemic cardiomyopathy: Secondary | ICD-10-CM | POA: Diagnosis not present

## 2022-01-04 HISTORY — PX: COLONOSCOPY WITH PROPOFOL: SHX5780

## 2022-01-04 LAB — GLUCOSE, CAPILLARY: Glucose-Capillary: 185 mg/dL — ABNORMAL HIGH (ref 70–99)

## 2022-01-04 SURGERY — COLONOSCOPY WITH PROPOFOL
Anesthesia: General

## 2022-01-04 MED ORDER — PHENYLEPHRINE 80 MCG/ML (10ML) SYRINGE FOR IV PUSH (FOR BLOOD PRESSURE SUPPORT)
PREFILLED_SYRINGE | INTRAVENOUS | Status: AC
  Filled 2022-01-04: qty 10

## 2022-01-04 MED ORDER — PROPOFOL 10 MG/ML IV BOLUS
INTRAVENOUS | Status: AC
  Filled 2022-01-04: qty 40

## 2022-01-04 MED ORDER — PROPOFOL 1000 MG/100ML IV EMUL
INTRAVENOUS | Status: AC
  Filled 2022-01-04: qty 200

## 2022-01-04 MED ORDER — PROPOFOL 10 MG/ML IV BOLUS
INTRAVENOUS | Status: DC | PRN
  Administered 2022-01-04 (×2): 30 mg via INTRAVENOUS
  Administered 2022-01-04: 40 mg via INTRAVENOUS
  Administered 2022-01-04 (×3): 30 mg via INTRAVENOUS
  Administered 2022-01-04: 100 mg via INTRAVENOUS
  Administered 2022-01-04: 30 mg via INTRAVENOUS

## 2022-01-04 MED ORDER — PROPOFOL 10 MG/ML IV BOLUS
INTRAVENOUS | Status: AC
  Filled 2022-01-04: qty 20

## 2022-01-04 MED ORDER — SODIUM CHLORIDE 0.9 % IV SOLN
INTRAVENOUS | Status: DC
  Administered 2022-01-04: 1000 mL via INTRAVENOUS

## 2022-01-04 NOTE — Interval H&P Note (Signed)
History and Physical Interval Note: Preprocedure H&P from 01/04/22  was reviewed and there was no interval change after seeing and examining the patient.  Written consent was obtained from the patient after discussion of risks, benefits, and alternatives. Patient has consented to proceed with Colonoscopy with possible intervention   01/04/2022 7:47 AM  Alexander Cobb  has presented today for surgery, with the diagnosis of H/O Colonic Polyps.  The various methods of treatment have been discussed with the patient and family. After consideration of risks, benefits and other options for treatment, the patient has consented to  Procedure(s) with comments: COLONOSCOPY WITH PROPOFOL (N/A) - IDDM as a surgical intervention.  The patient's history has been reviewed, patient examined, no change in status, stable for surgery.  I have reviewed the patient's chart and labs.  Questions were answered to the patient's satisfaction.     Annamaria Helling

## 2022-01-04 NOTE — Anesthesia Postprocedure Evaluation (Signed)
Anesthesia Post Note  Patient: Alexander Cobb  Procedure(s) Performed: COLONOSCOPY WITH PROPOFOL  Patient location during evaluation: Endoscopy Anesthesia Type: General Level of consciousness: awake and alert Pain management: pain level controlled Vital Signs Assessment: post-procedure vital signs reviewed and stable Respiratory status: spontaneous breathing, nonlabored ventilation, respiratory function stable and patient connected to nasal cannula oxygen Cardiovascular status: blood pressure returned to baseline and stable Postop Assessment: no apparent nausea or vomiting Anesthetic complications: no   No notable events documented.   Last Vitals:  Vitals:   01/04/22 0838 01/04/22 0849  BP: 107/72 (!) 158/73  Pulse: 64 63  Resp: 18 18  Temp:    SpO2: 94% 94%    Last Pain:  Vitals:   01/04/22 0827  TempSrc: Tympanic  PainSc: 0-No pain                 Precious Haws Chanc Kervin

## 2022-01-04 NOTE — Op Note (Signed)
[facility] Patient Name: Alexander Cobb Procedure Date: 01/04/2022 7:04 AM MRN: [id number] Account #: [id number] Date of Birth: 1950/12/10 Admit Type: Outpatient Age: 71 Room: [facility] ENDO ROOM 1 Gender: Male Note Status: Finalized Instrument Name: Colonoscope [id number] Procedure:             Colonoscopy Indications:           High risk colon cancer surveillance: Personal history                         of colonic polyps Providers:             Rueben Bash, DO Referring MD:          Annamaria Helling DO, DO (Referring MD), [name] MD, MD (Referring MD) Medicines:             Monitored Anesthesia Care Complications:         No immediate complications. Estimated blood loss:                         Minimal. Procedure:             Pre-Anesthesia Assessment:                        - Prior to the procedure, a History and Physical was                         performed, and patient medications and allergies were                         reviewed. The patient is competent. The risks and                         benefits of the procedure and the sedation options and                         risks were discussed with the patient. All questions                         were answered and informed consent was obtained.                         Patient identification and proposed procedure were                         verified by the physician, the nurse, the anesthetist                         and the technician in the endoscopy suite. Mental                         Status Examination: alert and oriented. Airway                         Examination: normal oropharyngeal airway and neck  mobility. Respiratory Examination: clear to                         auscultation. CV Examination: RRR, no murmurs, no S3                         or S4. Prophylactic Antibiotics: The patient does not                          require prophylactic antibiotics. Prior                         Anticoagulants: The patient has taken no anticoagulant                         or antiplatelet agents. ASA Grade Assessment: III - A                         patient with severe systemic disease. After reviewing                         the risks and benefits, the patient was deemed in                         satisfactory condition to undergo the procedure. The                         anesthesia plan was to use monitored anesthesia care                         (MAC). Immediately prior to administration of                         medications, the patient was re-assessed for adequacy                         to receive sedatives. The heart rate, respiratory                         rate, oxygen saturations, blood pressure, adequacy of                         pulmonary ventilation, and response to care were                         monitored throughout the procedure. The physical                         status of the patient was re-assessed after the                         procedure.                        After obtaining informed consent, the colonoscope was                         passed under direct vision. Throughout the procedure,  the patient's blood pressure, pulse, and oxygen                         saturations were monitored continuously. The                         Colonoscope was introduced through the anus and                         advanced to the the terminal ileum, with                         identification of the appendiceal orifice and IC                         valve. The colonoscopy was performed without                         difficulty. The patient tolerated the procedure well.                         The quality of the bowel preparation was evaluated                         using the BBPS [facility] Bowel Preparation Scale) with                         scores of: Right Colon = 2  (minor amount of residual                         staining, small fragments of stool and/or opaque                         liquid, but mucosa seen well), Transverse Colon = 3                         (entire mucosa seen well with no residual staining,                         small fragments of stool or opaque liquid) and Left                         Colon = 3 (entire mucosa seen well with no residual                         staining, small fragments of stool or opaque liquid).                         The total BBPS score equals 8. The quality of the                         bowel preparation was excellent. The terminal ileum,                         ileocecal valve, appendiceal orifice, and rectum were  photographed. Findings:      The perianal and digital rectal examinations were normal. Pertinent       negatives include normal sphincter tone.      The terminal ileum appeared normal. Estimated blood loss: none.      Non-bleeding internal hemorrhoids were found during retroflexion. The       hemorrhoids were Grade I (internal hemorrhoids that do not prolapse).       Estimated blood loss: none.      A single medium-sized localized angioectasia without bleeding was found       in the cecum. Estimated blood loss: none.      Retroflexion in the right colon was performed.      Two sessile polyps were found in the rectum and transverse colon. The       polyps were 1 to 2 mm in size. These polyps were removed with a jumbo       cold forceps. Resection and retrieval were complete. To prevent bleeding       after the polypectomy, one hemostatic clip was successfully placed (MR       conditional). There was no bleeding at the end of the procedure. Placed       on transverse polypectomy site Estimated blood loss was minimal.      A 10 mm polyp was found in the transverse colon. The polyp was sessile.       The polyp was removed with a cold snare. Resection and retrieval were        complete. To prevent bleeding after the polypectomy, one hemostatic clip       was successfully placed (MR conditional). There was no bleeding at the       end of the procedure. Estimated blood loss was minimal.      A diminutive anal fissure was found in the anal canal. Estimated blood       loss: none.      The exam was otherwise without abnormality on direct and retroflexion       views. Impression:            - The examined portion of the ileum was normal.                        - Non-bleeding internal hemorrhoids.                        - A single non-bleeding colonic angioectasia.                        - Two 1 to 2 mm polyps in the rectum and in the                         transverse colon, removed with a jumbo cold forceps.                         Resected and retrieved. Clip (MR conditional) was                         placed.                        - One 10 mm polyp in the transverse colon, removed  with a cold snare. Resected and retrieved. Clip (MR                         conditional) was placed.                        - Anal fissure.                        - The examination was otherwise normal on direct and                         retroflexion views. Recommendation:        - Patient has a contact number available for                         emergencies. The signs and symptoms of potential                         delayed complications were discussed with the patient.                         Return to normal activities tomorrow. Written                         discharge instructions were provided to the patient.                        - Discharge patient to home.                        - Resume previous diet.                        - Continue present medications.                        - No ibuprofen, naproxen, or other non-steroidal                         anti-inflammatory drugs for 5 days after polyp removal.                        - ok to  continue aspirin 81 mg                        - Resume Brilinta (ticagrelor) at prior dose in 4                         days. Refer to managing physician for further                         adjustment of therapy.                        - Await pathology results.                        - Repeat colonoscopy for surveillance based on  pathology results.                        - Return to referring physician as previously                         scheduled.                        - The findings and recommendations were discussed with                         the patient. Procedure Code(s):     --- Professional ---                        [phone], Colonoscopy, flexible; with removal of                         tumor(s), polyp(s), or other lesion(s) by snare                         technique                        45380, 44, Colonoscopy, flexible; with biopsy, single                         or multiple Diagnosis Code(s):     --- Professional ---                        Z86.010, Personal history of colonic polyps                        K64.0, First degree hemorrhoids                        K55.20, Angiodysplasia of colon without hemorrhage                        D12.8, Benign neoplasm of rectum                        D12.3, Benign neoplasm of transverse colon (hepatic                         flexure or splenic flexure)                        K60.2, Anal fissure, unspecified CPT copyright 2022 American Medical Association. All rights reserved. The codes documented in this report are preliminary and upon coder review may  be revised to meet current compliance requirements. Attending Participation:      I personally performed the entire procedure. Volney American, DO Annamaria Helling DO, DO 01/04/2022 8:30:09 AM This report has been signed electronically. Number of Addenda: 0 Note Initiated On: 01/04/2022 7:04 AM Scope Withdrawal Time: 0 hours 26 minutes 34 seconds  Total  Procedure Duration: 0 hours 29 minutes 5 seconds  Estimated Blood Loss:  Estimated blood loss was minimal.      [facility]

## 2022-01-04 NOTE — Transfer of Care (Signed)
Immediate Anesthesia Transfer of Care Note  Patient: Alexander Cobb  Procedure(s) Performed: COLONOSCOPY WITH PROPOFOL  Patient Location: Endoscopy Unit  Anesthesia Type:General  Level of Consciousness: drowsy  Airway & Oxygen Therapy: Patient Spontanous Breathing and Patient connected to nasal cannula oxygen  Post-op Assessment: Report given to RN, Post -op Vital signs reviewed and stable and Patient moving all extremities  Post vital signs: Reviewed and stable  Last Vitals:  Vitals Value Taken Time  BP    Temp    Pulse    Resp    SpO2      Last Pain:  Vitals:   01/04/22 0711  TempSrc: Temporal  PainSc: 0-No pain         Complications: No notable events documented.

## 2022-01-04 NOTE — Anesthesia Preprocedure Evaluation (Signed)
Anesthesia Evaluation  Patient identified by MRN, date of birth, ID band Patient awake    Reviewed: Allergy & Precautions, NPO status , Patient's Chart, lab work & pertinent test results  History of Anesthesia Complications Negative for: history of anesthetic complications  Airway Mallampati: III  TM Distance: <3 FB Neck ROM: full    Dental  (+) Chipped, Poor Dentition, Missing   Pulmonary sleep apnea , COPD, Current Smoker and Patient abstained from smoking.,    Pulmonary exam normal        Cardiovascular Exercise Tolerance: Good hypertension, + CAD, + Past MI, + Peripheral Vascular Disease and +CHF  Normal cardiovascular exam     Neuro/Psych negative neurological ROS  negative psych ROS   GI/Hepatic negative GI ROS, Neg liver ROS,   Endo/Other  diabetes, Type 2  Renal/GU negative Renal ROS  negative genitourinary   Musculoskeletal   Abdominal   Peds  Hematology negative hematology ROS (+)   Anesthesia Other Findings Past Medical History: No date: CAD (coronary artery disease)     Comment:  a. 12/2010 Cath: nonobs dzs; b. 10/2019 STEMI/PCI: LM nl,              LAD nl, LCX 75p/m, RCA 30p, 80md, RPDA 99 (2.25x30               Resolute Onyx DES); c. 11/2019 MV: moderate apical inf               fixed defect-->diaphragmatic atten/artiact. No ischemia.               EF 51%. Low risk. No date: Diabetes mellitus without complication (HCC) No date: HFrEF (heart failure with reduced ejection fraction) ([place]     Comment:  a. 07/2010 LV gram: EF 33%; b. 05/2018 Echo: EF 30-35%; c.              05/2018 TEE: EF 35-40%; d. 11/2019 Echo: EF 40-45%, gr1 DD.              [place] and inf HK. Nl RV fxn. 12/21/2010: HTN (hypertension) No date: Hyperlipidemia No date: Mixed Ischemic and non-ischemic cardiomyopathy     Comment:  a. 07/2010 LV gram: EF 33%; b. 05/2018 Echo: EF 30-35%; c.              05/2018 TEE: EF 35-40%; d. 11/2019  Echo: EF 40-45%. No date: OSA (obstructive sleep apnea)     Comment:  Uses CPAP at home No date: P[place]peripheral artery disease) ([place] 12/21/2010: PSVT (paroxysmal supraventricular tachycardia)     Comment:  a. 08/2021 Zio: Predominantly sinus rhythm - avg 64               (50-169). 1 NSVT x 8 beats @ 119. 9 SVT runs, fastest               169, longest 13 beats.  Rare PACs and PVCs. No date: Tobacco abuse  Past Surgical History: No date: CARDIAC CATHETERIZATION 11/07/2015: COLONOSCOPY WITH PROPOFOL; N/A     Comment:  Procedure: COLONOSCOPY WITH PROPOFOL;  Surgeon: MLollie Sails MD;  [facility];  Service:               Endoscopy;  Laterality: N/A; No date: CORONARY ANGIOPLASTY 11/09/2019: CORONARY/GRAFT ACUTE MI REVASCULARIZATION; N/A     Comment:  Procedure: Coronary/Graft Acute MI Revascularization;  Surgeon: Yolonda Kida, MD;  Location: [place]              CV LAB;  Service: Cardiovascular;  Laterality: N/A; 11/09/2019: LEFT HEART CATH AND CORONARY ANGIOGRAPHY; N/A     Comment:  Procedure: LEFT HEART CATH AND CORONARY ANGIOGRAPHY;                Surgeon: Yolonda Kida, MD;  Location: [place]              CV LAB;  Service: Cardiovascular;  Laterality: N/A; 05/18/2018: TEE WITHOUT CARDIOVERSION; N/A     Comment:  Procedure: TRANSESOPHAGEAL ECHOCARDIOGRAM (TEE);                Surgeon: Wellington Hampshire, MD;  Location: [facility] ORS;                Service: Cardiovascular;  Laterality: N/A;  BMI    Body Mass Index: 25.16 kg/m      Reproductive/Obstetrics negative OB ROS                             Anesthesia Physical Anesthesia Plan  ASA: 3  Anesthesia Plan: General   Post-op Pain Management:    Induction: Intravenous  PONV Risk Score and Plan: Propofol infusion and TIVA  Airway Management Planned: Natural Airway and Nasal Cannula  Additional Equipment:   Intra-op Plan:    Post-operative Plan:   Informed Consent: I have reviewed the patients History and Physical, chart, labs and discussed the procedure including the risks, benefits and alternatives for the proposed anesthesia with the patient or authorized representative who has indicated his/her understanding and acceptance.     Dental Advisory Given  Plan Discussed with: Anesthesiologist, CRNA and Surgeon  Anesthesia Plan Comments: (Patient consented for risks of anesthesia including but not limited to:  - adverse reactions to medications - risk of airway placement if required - damage to eyes, teeth, lips or other oral mucosa - nerve damage due to positioning  - sore throat or hoarseness - Damage to heart, brain, nerves, lungs, other parts of body or loss of life  Patient voiced understanding.)        Anesthesia Quick Evaluation

## 2022-01-07 LAB — SURGICAL PATHOLOGY

## 2022-04-23 ENCOUNTER — Other Ambulatory Visit: Payer: Self-pay | Admitting: Family

## 2022-05-05 NOTE — Progress Notes (Unsigned)
No old Cardiology Office Note  Date:  05/06/2022   ID:  Alexander, Cobb 16-Oct-1950, MRN [id number]  PCP:  [name], MD   Chief Complaint  Patient presents with   6 month follow up     Patient c/o chest pain and shortness of breath. Medications reviewed by the patient verbally.     HPI:  Alexander Cobb is a very pleasant 72 year old gentleman with a history of  smoking, who continues to smoke, 1ppd poorly controlled diabetes,  nonischemic cardiopathy, Non-STEMI, ejection fraction by catheterization estimated at 33% in May of 2012, mild proximal RCA disease, 75% PDA disease,  Hospitalization for pneumonia, staph auris bacteremia March 2020 ejection fraction 30 to 35% in 2020 August 2021, Successful PCI and stent of PDA with a 2.25 x 30 mm resolute Onyx  Ejection fraction 40 to 45%, inferior wall hypokinesis who presents for routine followup of his coronary artery disease  Last seen by myself in clinic November 2021 Seen by one of our providers August 2023  Continues to work with heavy machinery Reports he continues to smoke 1 pack/day, is not taking his insulin  Labs reviewed A1c 8.8, not taking insuln Total cholesterol 88 LDL 34  Rare chest pain symptoms, never taking nitroglycerin Discussed prior echocardiograms 2021 EF 40 to 45%  Blood pressure elevated this morning, did not take his morning medications  September 2021, he underwent stress testing which showed a fixed medium defect in the apical inferior location due to diaphragmatic attenuation/artifact. There was no ischemia. EF was 51%,   ABIs and duplex in October 2021  50 to 74% right common femoral arterial disease with less than 50% stenosis in the right common iliac. Three-vessel runoff was noted.   greater than 50% stenosis in the distal left external iliac artery.    September 06, 2021.  Zio monitor   sinus rhythm with 1, 8 beat run of nonsustained VT (max rate 119 bpm), and 9 brief runs of SVT (longest  13 beats, fastest 169 bpm).   advised to continue beta-blocker therapy.    STEMI 10/2019 images pulled up and reviewed Prox Cx lesion is 75% stenosed. Mid Cx lesion is 75% stenosed. RPDA lesion is 99% stenosed. Prox RCA lesion is 30% stenosed. Mid RCA lesion is 30% stenosed. A drug-eluting stent was successfully placed using a STENT RESOLUTE ONYX 2.25X30. Post intervention, there is a 0% residual stenosis. Severely depressed overall left ventricular function around 25 to 30% with inferior akinesis left ventricular enlargement   EKG personally reviewed by myself on todays visit Normal sinus rhythm with rate 67 bpm T wave abnormality V4 through V6 Unchanged from prior EKGs  After evaluation emergency room patient was brought to the cardiac Cath Lab for diagnostic cardiac cath possible PCI and stent LV function is globally depressed at around 25 to 30% with inferior akinesis Coronaries Long left main relatively free of disease LAD is moderate in size relatively free of disease Circumflex is moderate in size with OM1 with tandem 75% lesions RCA was a large vessel with anterior takeoff with mild to moderate disease proximal to mid but the PDA was diffusely diseased focally of around 99% long lesion TIMI-3 flow Successful PCI and stent of PDA with a 2.25 x 30 mm resolute Onyx reducing the lesion from 99 down to 0% Patient was maintained on Brilinta and aspirin  Echocardiogram r Previous ejection fraction 30 to 35% in 2020 Now up to 40 to 45% on current medications  Continues  to work with heavy machinery No desire to retire no exercise program  EKG personally reviewed by myself on todays visit Shows normal sinus rhythm rate 72 bpm nonspecific ST-T wave abnormality anterolateral leads, no change from prior EKGs  Other past medical history reviewed Previous event monitor showing SVT Still having flutters , tachycardia 30 sec episode today, got dizzy Resolved without intervention Has  approximately 1-2 x a week  05/2018 in the hospital , acute respiratory distress, Saturations in the 70s, While in the hospital became apneic  CPR was initiated and the patient was intubated.   Pneumonia seen on CT scan.   Diagnosed with Staph aureus bacteremia.  abscess right buttock area.   Treated with IV Ancef.  TEE negative foe endocarditis.     Reports he has recovered well since that time  Echo 05/2018 moderate-severely reduced systolic function, with an ejection fraction of 30-35%.  TEE 35-40% 05/2018  The symptoms make him feel weak,. Symptoms can go up to 5-10 minutes, on average 2-3 minutes at a time.    Echocardiogram 2012 showed ejection fraction 40-45%.   PMH:   has a past medical history of CAD (coronary artery disease), Diabetes mellitus without complication ([place]), HFrEF (heart failure with reduced ejection fraction) ([place]), HTN (hypertension) (12/21/2010), Hyperlipidemia, Mixed Ischemic and non-ischemic cardiomyopathy, OSA (obstructive sleep apnea), PAD (peripheral artery disease) ([place]), PSVT (paroxysmal supraventricular tachycardia) (12/21/2010), and Tobacco abuse.  PSH:    Past Surgical History:  Procedure Laterality Date   CARDIAC CATHETERIZATION     COLONOSCOPY WITH PROPOFOL N/A 11/07/2015   Procedure: COLONOSCOPY WITH PROPOFOL;  Surgeon: Lollie Sails, MD;  Location: [facility] ENDOSCOPY;  Service: Endoscopy;  Laterality: N/A;   COLONOSCOPY WITH PROPOFOL N/A 01/04/2022   Procedure: COLONOSCOPY WITH PROPOFOL;  Surgeon: Annamaria Helling, DO;  [facility];  Service: Gastroenterology;  Laterality: N/A;  IDDM   CORONARY ANGIOPLASTY     CORONARY/GRAFT ACUTE MI REVASCULARIZATION N/A 11/09/2019   Procedure: Coronary/Graft Acute MI Revascularization;  Surgeon: Yolonda Kida, MD;  Location: [place] CV LAB;  Service: Cardiovascular;  Laterality: N/A;   LEFT HEART CATH AND CORONARY ANGIOGRAPHY N/A 11/09/2019   Procedure: LEFT HEART CATH AND CORONARY  ANGIOGRAPHY;  Surgeon: Yolonda Kida, MD;  Location: [place] CV LAB;  Service: Cardiovascular;  Laterality: N/A;   TEE WITHOUT CARDIOVERSION N/A 05/18/2018   Procedure: TRANSESOPHAGEAL ECHOCARDIOGRAM (TEE);  Surgeon: Wellington Hampshire, MD;  Location: [facility] ORS;  Service: Cardiovascular;  Laterality: N/A;    Current Outpatient Medications  Medication Sig Dispense Refill   atorvastatin (LIPITOR) 80 MG tablet TAKE 1 TABLET BY MOUTH EVERY DAY 90 tablet 3   blood glucose meter kit and supplies KIT Dispense based on patient and insurance preference. Use up to four times daily as directed. (FOR ICD-9 250.00, 250.01). 1 each 0   carvedilol (COREG) 12.5 MG tablet Take 1 tablet (12.5 mg total) by mouth 2 (two) times daily. 180 tablet 3   dapagliflozin propanediol (FARXIGA) 10 MG TABS tablet Take 1 tablet (10 mg total) by mouth daily. 30 tablet 5   Insulin Glargine (LANTUS SOLOSTAR) 100 UNIT/ML Solostar Pen Inject 15 Units into the skin daily. 100 mL 1   Insulin Pen Needle 32G X 4 MM MISC 15 Units by Does not apply route daily at 10 pm. 90 each 0   losartan (COZAAR) 100 MG tablet TAKE 1 TABLET BY MOUTH EVERY DAY, STOP ENTRESTO 90 tablet 3   metFORMIN (GLUCOPHAGE) 500 MG tablet Take 1  tablet (500 mg total) by mouth 2 (two) times daily with a meal. 60 tablet 1   methimazole (TAPAZOLE) 5 MG tablet Take 2.5 mg by mouth daily.     Multiple Vitamin (MULTIVITAMIN) tablet Take 1 tablet by mouth daily.     nitroGLYCERIN (NITROSTAT) 0.4 MG SL tablet Place 1 tablet (0.4 mg total) under the tongue every 5 (five) minutes as needed for chest pain. 90 tablet 3   sertraline (ZOLOFT) 50 MG tablet Take 50 mg by mouth daily.     spironolactone (ALDACTONE) 25 MG tablet Take 1 tablet (25 mg total) by mouth daily. 90 tablet 3   ticagrelor (BRILINTA) 90 MG TABS tablet Take 1 tablet (90 mg total) by mouth 2 (two) times daily. 180 tablet 3   No current facility-administered medications for this visit.    Allergies:    Sulfa drugs cross reactors   Social History:  The patient  reports that she has been smoking cigarettes. She has a 40.00 pack-year smoking history. She has never used smokeless tobacco. She reports that she does not currently use alcohol. She reports that she does not use drugs.   Family History:   family history includes COPD in her father.    Review of Systems: Review of Systems  Constitutional: Negative.   HENT: Negative.    Respiratory: Negative.    Cardiovascular: Negative.   Gastrointestinal: Negative.   Musculoskeletal: Negative.   Neurological: Negative.   Psychiatric/Behavioral: Negative.    All other systems reviewed and are negative.   PHYSICAL EXAM: VS:  BP (!) 160/80 (BP Location: Left Arm, Patient Position: Sitting, Cuff Size: Normal) Comment: 5 min BP  Pulse 67   Ht '5\' 9"'$  (1.753 m)   Wt 182 lb (82.6 kg)   SpO2 93%   BMI 26.88 kg/m  , BMI Body mass index is 26.88 kg/m. Constitutional:  oriented to person, place, and time. No distress.  HENT:  Head: Grossly normal Eyes:  no discharge. No scleral icterus.  Neck: No JVD, no carotid bruits  Cardiovascular: Regular rate and rhythm, no murmurs appreciated Pulmonary/Chest: Clear to auscultation bilaterally, no wheezes or rails Abdominal: Soft.  no distension.  no tenderness.  Musculoskeletal: Normal range of motion Neurological:  normal muscle tone. Coordination normal. No atrophy Skin: Skin warm and dry Psychiatric: normal affect, pleasant  Recent Labs: 07/26/2021: BUN 23; Creatinine, Ser 1.27; Potassium 4.6; Sodium 137    Lipid Panel Lab Results  Component Value Date   CHOL 138 11/10/2019   HDL 41 11/10/2019   LDLCALC 76 11/10/2019   TRIG 105 11/10/2019      Wt Readings from Last 3 Encounters:  05/06/22 182 lb (82.6 kg)  01/04/22 170 lb 5.6 oz (77.3 kg)  11/02/21 175 lb (79.4 kg)     ASSESSMENT AND PLAN:  Coronary artery disease involving native coronary artery of native heart without angina  pectoris  Currently with no symptoms of angina. No further workup at this time. Continue current medication regimen. 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 no changes made, recommended he monitor pressures at home and call us with numbers.  Pure hypercholesterolemia  Cholesterol is at goal on the current lipid regimen. No changes to the medications were made.  Smoking We have encouraged him to continue to work on weaning his cigarettes and smoking cessation. He will continue to work on this   Poorly controlled type 2 diabetes mellitus ([place]) A1c poorly controlled, reports he is not using his insulin Stressed importance of aggressive diabetes control and the effect on coronary disease    Total encounter time more than 40 minutes  Greater than 50% was spent in counseling and coordination of care with the patient  No orders of the defined types were placed in this encounter.    Signed, Esmond Plants, M.D., Ph.D. 05/06/2022  [place], [place]

## 2022-05-06 ENCOUNTER — Encounter: Payer: Self-pay | Admitting: Cardiovascular Disease

## 2022-05-06 ENCOUNTER — Ambulatory Visit: Payer: Medicare HMO | Attending: Cardiovascular Disease | Admitting: Cardiovascular Disease

## 2022-05-06 VITALS — BP 170/90 | HR 67 | Ht 69.0 in | Wt 182.0 lb

## 2022-05-06 DIAGNOSIS — I255 Ischemic cardiomyopathy: Secondary | ICD-10-CM

## 2022-05-06 DIAGNOSIS — E1165 Type 2 diabetes mellitus with hyperglycemia: Secondary | ICD-10-CM

## 2022-05-06 DIAGNOSIS — I25118 Atherosclerotic heart disease of native coronary artery with other forms of angina pectoris: Secondary | ICD-10-CM | POA: Diagnosis not present

## 2022-05-06 DIAGNOSIS — I10 Essential (primary) hypertension: Secondary | ICD-10-CM

## 2022-05-06 DIAGNOSIS — I5022 Chronic systolic (congestive) heart failure: Secondary | ICD-10-CM | POA: Diagnosis not present

## 2022-05-06 DIAGNOSIS — Z720 Tobacco use: Secondary | ICD-10-CM

## 2022-05-06 DIAGNOSIS — I739 Peripheral vascular disease, unspecified: Secondary | ICD-10-CM

## 2022-05-06 DIAGNOSIS — E785 Hyperlipidemia, unspecified: Secondary | ICD-10-CM

## 2022-05-06 NOTE — Patient Instructions (Signed)
Medication Instructions:  No changes  If you need a refill on your cardiac medications before your next appointment, please call your pharmacy.   Lab work: No new labs needed  Testing/Procedures: No new testing needed  Follow-Up: At [facility], you and your health needs are our priority.  As part of our continuing mission to provide you with exceptional heart care, we have created designated Provider Care Teams.  These Care Teams include your primary Cardiologist (physician) and Advanced Practice Providers (APPs -  Physician Assistants and Nurse Practitioners) who all work together to provide you with the care you need, when you need it.  You will need a follow up appointment in 12 months  Providers on your designated Care Team:   Christopher Berge, NP Ryan Dunn, PA-C Cadence Furth, PA-C  COVID-19 Vaccine Information can be found at: [email or web address] For questions related to vaccine distribution or appointments, please email [email or web address] or call [phone].   

## 2022-06-12 ENCOUNTER — Other Ambulatory Visit: Payer: Self-pay | Admitting: Internal Medicine

## 2022-06-12 DIAGNOSIS — F17210 Nicotine dependence, cigarettes, uncomplicated: Secondary | ICD-10-CM

## 2022-06-12 DIAGNOSIS — I2510 Atherosclerotic heart disease of native coronary artery without angina pectoris: Secondary | ICD-10-CM

## 2022-06-17 ENCOUNTER — Other Ambulatory Visit: Payer: Self-pay | Admitting: Family

## 2022-07-05 ENCOUNTER — Ambulatory Visit
Admission: RE | Admit: 2022-07-05 | Discharge: 2022-07-05 | Disposition: A | Discharge status: Home / Self Care | Payer: Medicare HMO | Source: Ambulatory Visit | Attending: Internal Medicine | Admitting: Internal Medicine

## 2022-07-05 DIAGNOSIS — F17210 Nicotine dependence, cigarettes, uncomplicated: Secondary | ICD-10-CM | POA: Diagnosis present

## 2022-07-05 DIAGNOSIS — I2510 Atherosclerotic heart disease of native coronary artery without angina pectoris: Secondary | ICD-10-CM | POA: Insufficient documentation

## 2022-08-05 ENCOUNTER — Other Ambulatory Visit: Payer: Self-pay | Admitting: Family

## 2022-12-06 IMAGING — CT CT CHEST LUNG CANCER SCREENING LOW DOSE W/O CM
1 of 2 series · 15 of 33 positions shown, 19 images · non-contrast
Comparison: 05/16/2018 CTA chest.  No prior screening CT.

CLINICAL DATA: Fifty-five pack-year smoking history/current smoker



[Series 4: chest 1mm/super d · axial · 0.78mm/px · z∈[-365,-51]mm · 15 of 430 slices shown, 19 images]
[im 19/430  mediastinal]
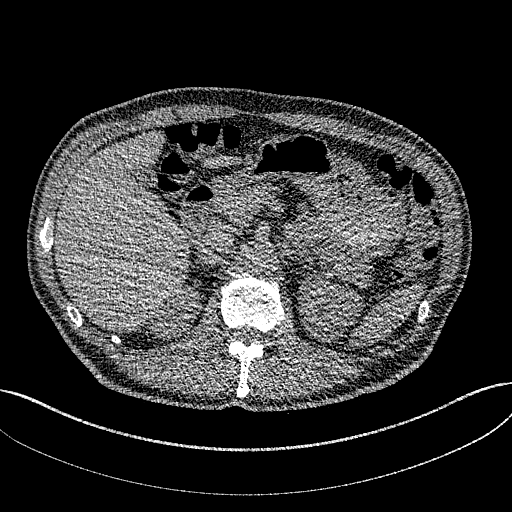
[im 19/430  lung]
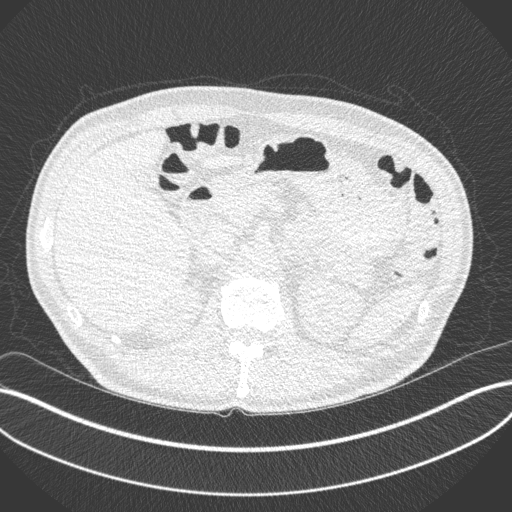
[im 56/430  lung]
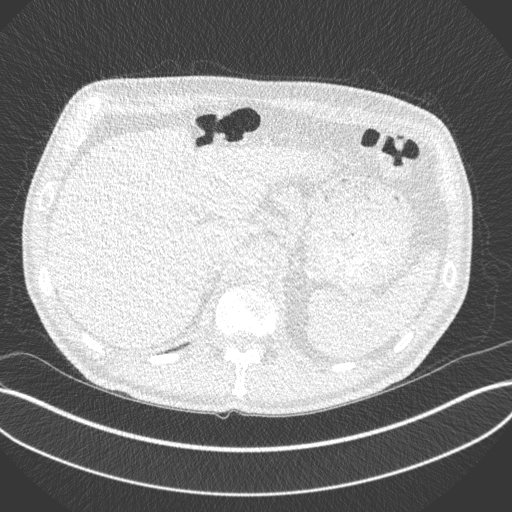
[im 94/430  lung]
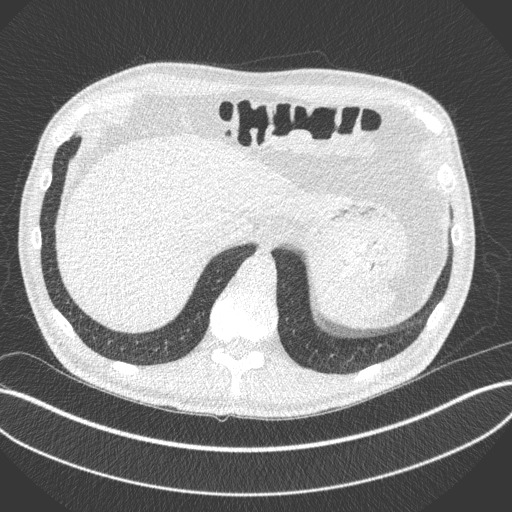
[im 112/430  lung]
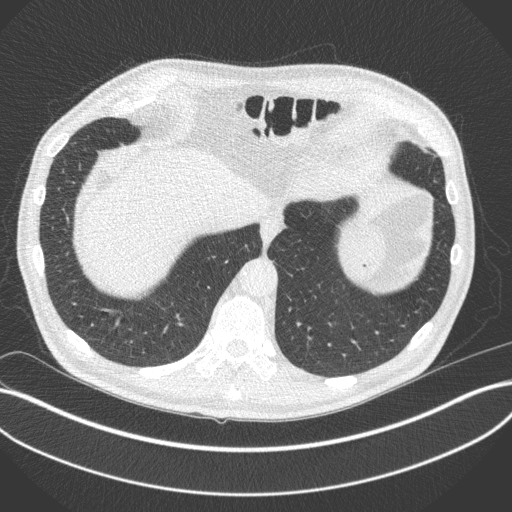
[im 131/430  mediastinal]
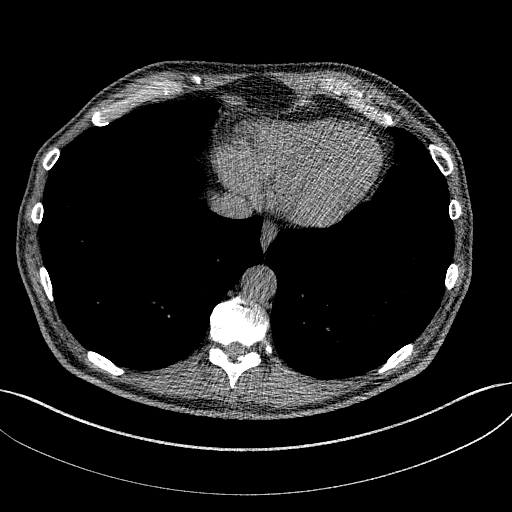
[im 131/430  lung]
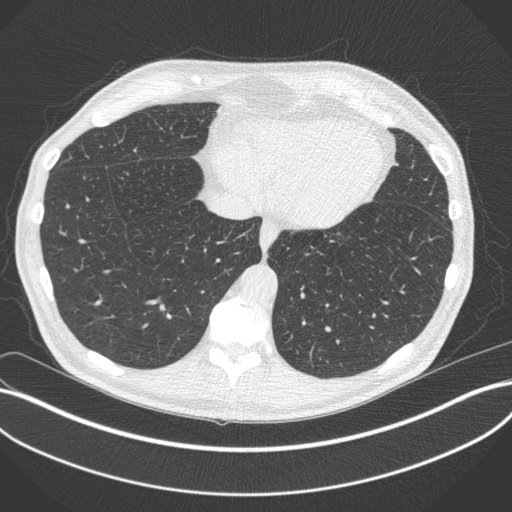
[im 168/430  lung]
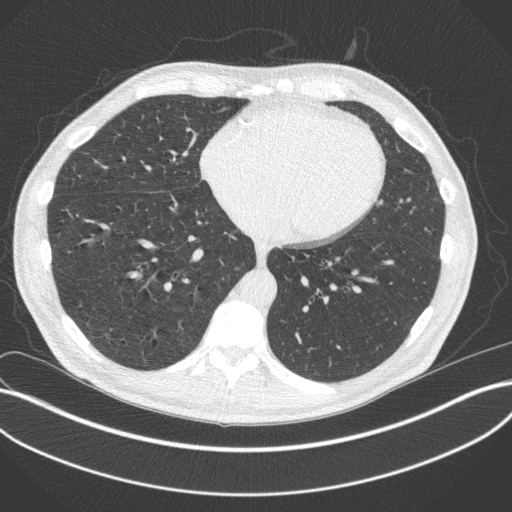
[im 203/430  lung]
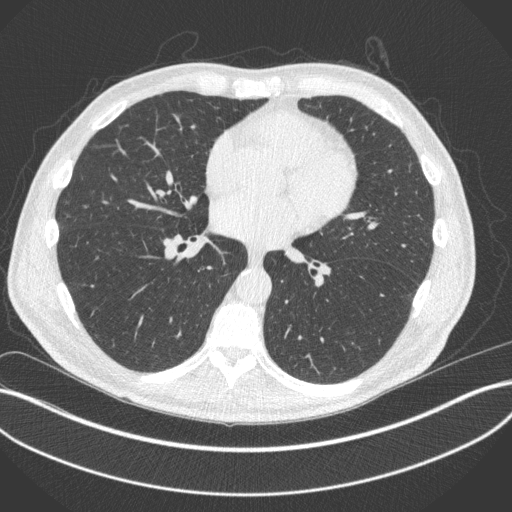
[im 215/430  lung]
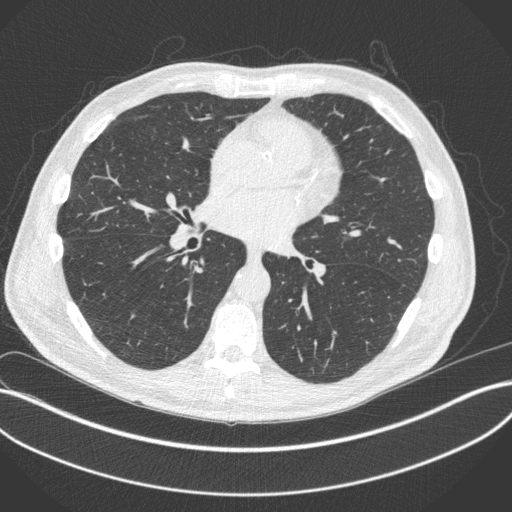
[im 224/430  mediastinal]
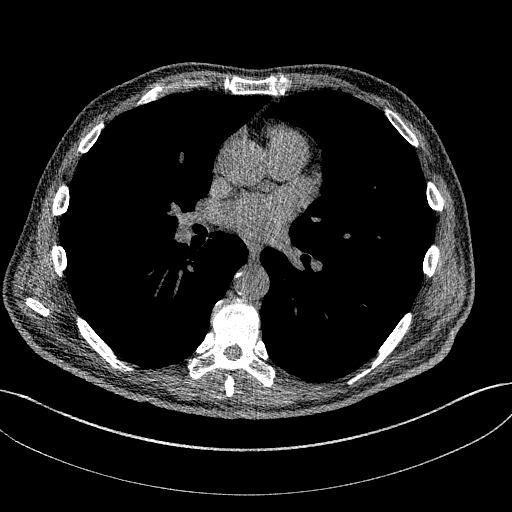
[im 224/430  lung]
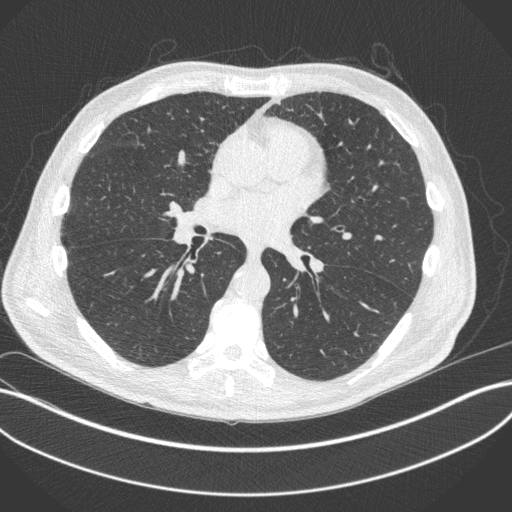
[im 262/430  lung]
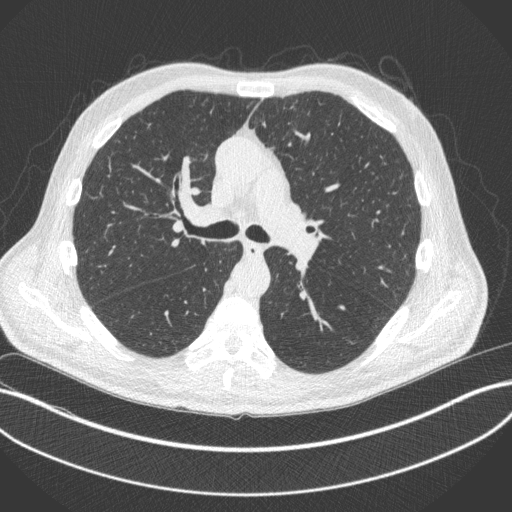
[im 299/430  lung]
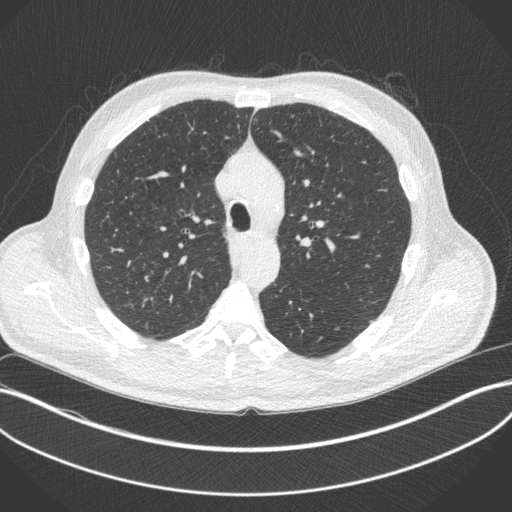
[im 318/430  lung]
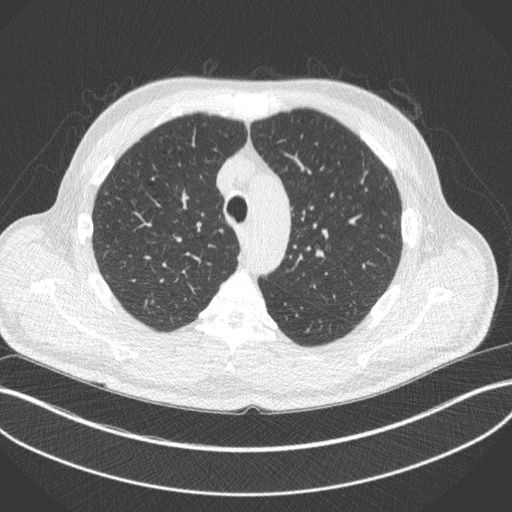
[im 336/430  mediastinal]
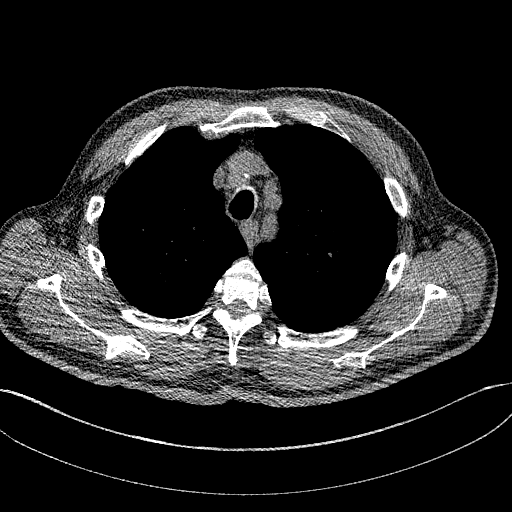
[im 336/430  lung]
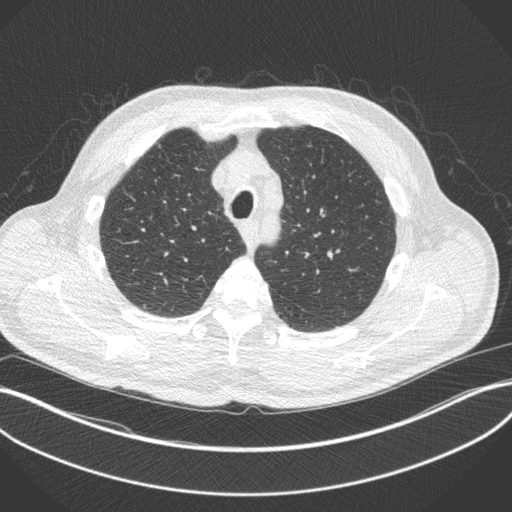
[im 374/430  lung]
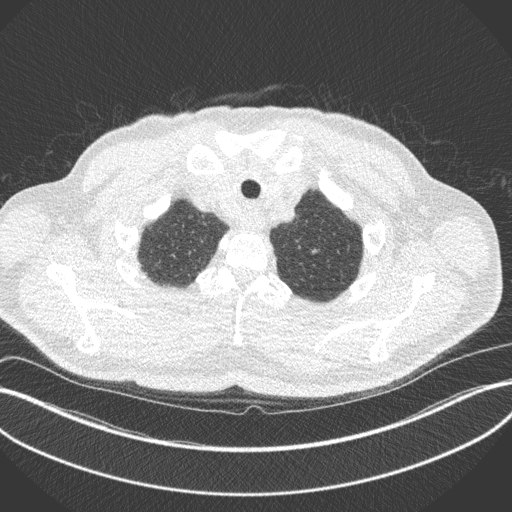
[im 411/430  lung]
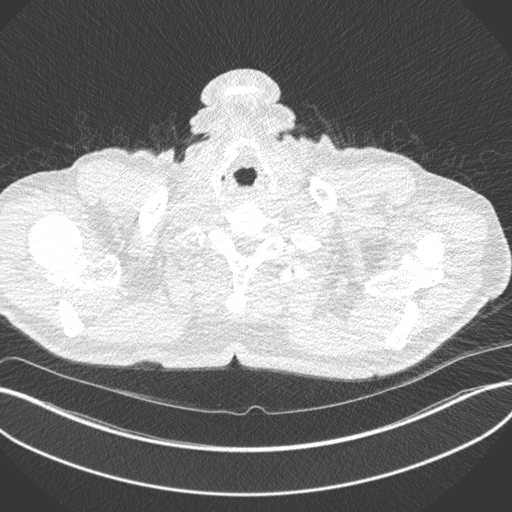

[15 of 33 positions shown; findings below may reference images not displayed]

FINDINGS: Cardiovascular: Aortic atherosclerosis. Normal heart size, without
pericardial effusion. Three vessel coronary artery calcification.

Mediastinum/Nodes: No mediastinal or definite hilar adenopathy,
given limitations of unenhanced CT.

Lungs/Pleura: No pleural fluid. Mild centrilobular emphysema.
Diffuse upper lobe predominant micronodularity is likely indicative
of smoking related respiratory bronchiolitis. No suspicious
pulmonary nodule or mass.

Upper Abdomen: Normal imaged portions of the liver, spleen, stomach,
pancreas, gallbladder, adrenal glands, kidneys.

Musculoskeletal: No acute osseous abnormality.
IMPRESSION: 1. Lung-RADS 1, negative. Continue annual screening with low-dose
chest CT without contrast in 12 months.
2. Aortic Atherosclerosis (3ILIB-OZG.G) and Emphysema ([id number].I).
Coronary artery atherosclerosis.

## 2023-05-17 ENCOUNTER — Other Ambulatory Visit: Payer: Self-pay | Admitting: Family

## 2023-05-21 NOTE — Telephone Encounter (Signed)
 I left a voicemail for pt to attempt to get him an appt as it's been almost a year and his last couple of scheduled appts were NS. Would you like to refill or does he need appt first? Thanks!

## 2023-06-20 ENCOUNTER — Other Ambulatory Visit: Payer: Self-pay | Admitting: Internal Medicine

## 2023-06-20 DIAGNOSIS — F17210 Nicotine dependence, cigarettes, uncomplicated: Secondary | ICD-10-CM

## 2023-07-01 ENCOUNTER — Ambulatory Visit
Admission: RE | Admit: 2023-07-01 | Discharge: 2023-07-01 | Disposition: A | Discharge status: Home / Self Care | Source: Ambulatory Visit | Attending: Internal Medicine | Admitting: Internal Medicine

## 2023-07-01 DIAGNOSIS — F17210 Nicotine dependence, cigarettes, uncomplicated: Secondary | ICD-10-CM | POA: Insufficient documentation

## 2023-08-10 NOTE — Progress Notes (Unsigned)
 Cardiology Clinic Note   Date: 08/13/2023 ID: Alexander Cobb, DOB 1950/03/26, MRN [id number]  Primary Cardiologist:  Belva Boyden, MD  Chief Complaint   [name] is a 73 y.o. adult who presents to the clinic today for routine follow up.   Patient Profile   Alexander Cobb is followed by Dr. Gollan for the history outlined below.      Past medical history significant for: CAD. LHC 08/07/2010 (unstable angina): PDA 75%.  Proximal RCA 30%.  EF calculated at 33%. LHC 11/09/2019 (STEMI): Proximal LCx 75%.  Mid LCx 75%.  RPDA 99%.  Proximal RCA 30%.  Mid RCA 30%.  PCI with DES 2.25 x 30 mm to PDA. Nuclear stress test 12/08/2019: Fixed medium defect in the apical inferior location due to diaphragmatic attenuation/artifact.  No ischemia. Chronic systolic heart failure. Echo 11/10/2019: EF 40 to 45%.  Hypokinesis of the left ventricular entire anterolateral wall and inferior wall.  Grade I DD.  Normal RV size/function.  No significant valvular abnormalities. Palpitations/PSVT. 10-day ZIO 09/28/2021: HR 50 to 169 bpm, average 64 bpm.  1 run of VT lasting 8 beats max rate 119 bpm.  9 runs of SVT fastest 6 beats max rate 169 bpm, longest 13 beats average rate 144 bpm.  Rare ectopy. PAD. Lower extremity arterial ultrasound/ABI 01/03/2020: Right: Atherosclerosis at the aorta and iliac arteries <50% in the common iliac artery, 50 to 74% in the common femoral artery and proximal SFA.  Three-vessel runoff.  Left: Atherosclerosis of the aorta and iliac arteries > 50% in the distal external iliac artery.  Three-vessel runoff.  ABIs demonstrated no significant lower extremity arterial disease bilaterally. Hypertension. Hyperlipidemia. Lipid panel 05/21/2023: LDL 83, HDL 37, TG 99, total 140. OSA. T2DM. Tobacco abuse.  In summary, patient has a history of CAD dating back to 2012.  He underwent LHC in May 2012 for NSTEMI which showed RPDA with 75% stenosis and proximal RCA 30% stenosis treated medically.   EF was calculated at 33%.  Follow-up echo October 2012 showed EF 40 to 45%.  In September 2016 he complained of palpitations and event monitor demonstrated runs of SVT.  In March 2020 he was admitted with respiratory failure, CHF, MSSA bacteremia requiring CPR and intubation.  TEE was negative for endocarditis.  EF is 35 to 40%.  In late August 2021 he was admitted with chest pain and STEMI.  He underwent PCI with DES to RPDA.  Echo showed EF 40 to 45% with grade 1 DD.  Stress testing September 2021 showed a fixed defect likely artifact at no ischemia.  He reported increased palpitations in June 2023 and underwent outpatient ZIO monitoring which showed runs of SVT.  Patient was last seen in the office by Dr. Gollan on 05/06/2022 for routine follow-up.  He was doing well at that time.  BP was elevated at the time of his visit and he reported not taking morning medications.  It was recommended that he monitor pressures at home and contact the office with the readings.  No medication changes were made.     History of Present Illness    Today, patient reports a 2 month history of central chest pain occurring without exertion with associated shortness of breath lasting minutes and resolving on its own. He does not notice pain with exertion. He has never had discomfort like this before. He has palpitations "all the time" described as fluttering and feeling like "my heart is coming out of my chest." These  episodes last for a minute or so and resolve on its own. This is unchanged from previous although he feels it may be happening a little more frequently. PCP recently decreased carvedilol  secondary to patient feeling off balance with walking. He denies dizziness unless he gets up too fast. No presyncope or syncope. 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 He does not exercise regularly and no longer performs yard work. He plays golf. He thinks PCP told him to stop atorvastatin  over a year ago  because his cholesterol was "good." He is not taking spironolactone  and believes that was also stopped by PCP. He denies lower extremity edema, orthopnea or PND. Chronic shortness of breath is stable.     ROS: All other systems reviewed and are otherwise negative except as noted in History of Present Illness.  EKGs/Labs Reviewed    EKG Interpretation Date/Time:  Wednesday August 13 2023 08:17:40 EDT Ventricular Rate:  62 PR Interval:  188 QRS Duration:  108 QT Interval:  440 QTC Calculation: 446 R Axis:   85  Text Interpretation: Normal sinus rhythm Incomplete left bundle branch block ST & T wave abnormality, consider inferolateral ischemia When compared with ECG of 05/06/2022 No significant change Confirmed by Morey Ar [phone]) on 08/13/2023 8:26:27 AM    Physical Exam    VS:  BP 132/80 (BP Location: Left Arm, Patient Position: Sitting, Cuff Size: Normal)   Pulse 62   Ht 5\' 9"  (1.753 m)   Wt 197 lb (89.4 kg)   SpO2 90%   BMI 29.09 kg/m  , BMI Body mass index is 29.09 kg/m.  GEN: Well nourished, well developed, in no acute distress. Neck: No JVD or carotid bruits. Cardiac:  RRR. No murmurs. No rubs or gallops.   Respiratory:  Respirations regular and unlabored. Clear to auscultation without rales, wheezing or rhonchi. GI: Soft, nontender, nondistended. Extremities: Radials/DP/PT 2+ and equal bilaterally. No clubbing or cyanosis. No edema.  Skin: Warm and dry, no rash. Neuro: Strength intact.  Assessment & Plan   CAD S/p PCI with DES to PDA August 2021 in the setting of STEMI.  Patient reports 2 months of central chest pain occurring without exertion and associated with shortness of breath. Pain lasts for minutes and resolves on its own. It can occur one day and then not return for several days in a row. He does not note discomfort with exertional activities. He never had chest pain with prior MI.  - Schedule nuclear stress test.  - Continue carvedilol , aspirin , as  needed SL NTG  Chronic systolic heart failure Echo September 2021 showed EF 40 to 45%, Grade I DD.  Patient denies lower extremity edema, orthopnea or PND. He has chronic shortness of breath that is stable.  Euvolemic and well compensated on exam. - Update echo.  -Continue carvedilol , losartan , Farxiga .  Palpitations/PSVT Patient reports palpitations "all the time" described as fluttering and feeling like "my heart is coming out of my chest." Episodes are about the same as previous but may be coming more frequently. PCP recently decreased carvedilol  as patient was feeling imbalance with walking. He denies lightheadedness, dizziness, presyncope or syncope. EKG shows NSR 62 bpm with incomplete LBBB unchanged from previous.  - Continue carvedilol .  Hypertension BP today 140/80 on intake and 132/80 on my recheck. He does not check BP at home. PCP stopped spironolactone  at some point.  - Continue carvedilol , losartan .  Hyperlipidemia LDL 83 March 2025.  This is increased from July 2024.  Patient reports he has not been taking atorvastatin  for over a year as PCP stopped it because his cholesterol was "good."  - Restart  atorvastatin  at 40 mg daily.  Tobacco abuse Patient continues to smoke. He is not interested in quitting.  - Encouraged cessation.   Disposition: Nuclear stress test. Echo. Lipitor  40 mg daily. Return in 6 months or sooner based on results of stress test.      Informed Consent   Shared Decision Making/Informed Consent The risks [chest pain, shortness of breath, cardiac arrhythmias, dizziness, blood pressure fluctuations, myocardial infarction, stroke/transient ischemic attack, nausea, vomiting, allergic reaction, radiation exposure, metallic taste sensation and life-threatening complications (estimated to be 1 in 10,000)], benefits (risk stratification, diagnosing coronary artery disease, treatment guidance) and alternatives of a nuclear stress test were discussed in detail with  Ms. Cartelli and she agrees to proceed.      Signed, Lonell Rives. Jakaiya Netherland, DNP, NP-C

## 2023-08-13 ENCOUNTER — Encounter: Payer: Self-pay | Admitting: Student

## 2023-08-13 ENCOUNTER — Ambulatory Visit: Attending: Student | Admitting: Student

## 2023-08-13 VITALS — BP 132/80 | HR 62 | Ht 69.0 in | Wt 197.0 lb

## 2023-08-13 DIAGNOSIS — I5022 Chronic systolic (congestive) heart failure: Secondary | ICD-10-CM | POA: Diagnosis not present

## 2023-08-13 DIAGNOSIS — Z720 Tobacco use: Secondary | ICD-10-CM

## 2023-08-13 DIAGNOSIS — I471 Supraventricular tachycardia, unspecified: Secondary | ICD-10-CM

## 2023-08-13 DIAGNOSIS — I25118 Atherosclerotic heart disease of native coronary artery with other forms of angina pectoris: Secondary | ICD-10-CM | POA: Diagnosis not present

## 2023-08-13 DIAGNOSIS — I10 Essential (primary) hypertension: Secondary | ICD-10-CM

## 2023-08-13 DIAGNOSIS — R072 Precordial pain: Secondary | ICD-10-CM | POA: Diagnosis not present

## 2023-08-13 DIAGNOSIS — R002 Palpitations: Secondary | ICD-10-CM

## 2023-08-13 DIAGNOSIS — E785 Hyperlipidemia, unspecified: Secondary | ICD-10-CM

## 2023-08-13 DIAGNOSIS — R0609 Other forms of dyspnea: Secondary | ICD-10-CM

## 2023-08-13 MED ORDER — ATORVASTATIN CALCIUM 40 MG PO TABS: 40.0000 mg | ORAL_TABLET | Freq: Every day | ORAL | 3 refills | Status: DC

## 2023-08-13 NOTE — Patient Instructions (Signed)
 Medication Instructions:  Your physician recommends that you continue on your current medications as directed. Please refer to the Current Medication list given to you today.    *If you need a refill on your cardiac medications before your next appointment, please call your pharmacy*  Lab Work: No labs ordered today   Testing/Procedures: Your physician has requested that you have an echocardiogram. Echocardiography is a painless test that uses sound waves to create images of your heart. It provides your doctor with information about the size and shape of your heart and how well your heart's chambers and valves are working.   You may receive an ultrasound enhancing agent through an IV if needed to better visualize your heart during the echo. This procedure takes approximately one hour.  There are no restrictions for this procedure.  This will take place at [address] [facility]) [address], [place] [address]  Please note: We ask at that you not bring children with you during ultrasound (echo/ vascular) testing. Due to room size and safety concerns, children are not allowed in the ultrasound rooms during exams. Our front office staff cannot provide observation of children in our lobby area while testing is being conducted. An adult accompanying a patient to their appointment will only be allowed in the ultrasound room at the discretion of the ultrasound technician under special circumstances. We apologize for any inconvenience.   Your provider has ordered a Lexiscan / Exercise Myoview  Stress test. This will take place at [facility]. Please report to the [facility] entrance. The volunteers at the first desk will direct you where to go.  [facility] MYOVIEW   Your provider has ordered a Stress Test with nuclear imaging. The purpose of this test is to evaluate the blood supply to your heart muscle. This procedure is referred to as a "Non-Invasive Stress Test." This is because other than having  an IV started in your vein, nothing is inserted or "invades" your body. Cardiac stress tests are done to find areas of poor blood flow to the heart by determining the extent of coronary artery disease (CAD). Some patients exercise on a treadmill, which naturally increases the blood flow to your heart, while others who are unable to walk on a treadmill due to physical limitations will have a pharmacologic/chemical stress agent called Lexiscan  . This medicine will mimic walking on a treadmill by temporarily increasing your coronary blood flow.   Please note: these test may take anywhere between 2-4 hours to complete  How to prepare for your Myoview  test:  Nothing to eat for 6 hours prior to the test No caffeine for 24 hours prior to test No smoking 24 hours prior to test. Your medication may be taken with water.  If your doctor stopped a medication because of this test, do not take that medication. Ladies, please do not wear dresses.  Skirts or pants are appropriate. Please wear a short sleeve shirt. No perfume, cologne or lotion. Wear comfortable walking shoes. No heels!   PLEASE NOTIFY THE OFFICE AT LEAST 24 HOURS IN ADVANCE IF YOU ARE UNABLE TO KEEP YOUR APPOINTMENT.  [phone] AND  PLEASE NOTIFY NUCLEAR MEDICINE AT [facility] AT LEAST 24 HOURS IN ADVANCE IF YOU ARE UNABLE TO KEEP YOUR APPOINTMENT. [phone]   Follow-Up: At [facility], you and your health needs are our priority.  As part of our continuing mission to provide you with exceptional heart care, our providers are all part of one team.  This team includes  your primary Cardiologist (physician) and Advanced Practice Providers or APPs (Physician Assistants and Nurse Practitioners) who all work together to provide you with the care you need, when you need it.  Your next appointment:   6 month(s)  Provider:   You may see Timothy Gollan, MD or one of the following Advanced Practice Providers on your designated Care Team:    Laneta Pintos, NP Gildardo Labrador, PA-C Varney Gentleman, PA-C Cadence Boulder, PA-C Ronald Cockayne, NP Morey Ar, NP

## 2023-08-27 ENCOUNTER — Telehealth: Payer: Self-pay | Admitting: Cardiovascular Disease

## 2023-08-27 ENCOUNTER — Ambulatory Visit: Attending: Student

## 2023-08-27 DIAGNOSIS — R0609 Other forms of dyspnea: Secondary | ICD-10-CM

## 2023-08-27 LAB — ECHOCARDIOGRAM COMPLETE
AR max vel: 2.96 cm2
AV Area VTI: 2.92 cm2
AV Area mean vel: 2.92 cm2
AV Mean grad: 3 mmHg
AV Peak grad: 5.5 mmHg
Ao pk vel: 1.17 m/s
Area-P 1/2: 2.76 cm2
Calc EF: 41.3 %
S' Lateral: 3.8 cm
Single Plane A2C EF: 39.4 %
Single Plane A4C EF: 44.2 %

## 2023-08-27 MED ORDER — ATORVASTATIN CALCIUM 40 MG PO TABS: 40.0000 mg | ORAL_TABLET | Freq: Every day | ORAL | 3 refills | Status: AC

## 2023-08-27 MED ORDER — CARVEDILOL 12.5 MG PO TABS: 12.5000 mg | ORAL_TABLET | Freq: Two times a day (BID) | ORAL | 3 refills | Status: AC

## 2023-08-27 NOTE — Telephone Encounter (Signed)
 Patient stopped by office - needs to change pharmacy  States that all medications should be sent to [facility] in [place] on [address]. States all medications from last visit were sent to [facility], please transfer

## 2023-08-27 NOTE — Telephone Encounter (Signed)
 Pt's medications were sent to pt's pharmacy as requested. Confirmation received.

## 2023-09-01 ENCOUNTER — Encounter
Admission: RE | Admit: 2023-09-01 | Discharge: 2023-09-01 | Disposition: A | Discharge status: Home / Self Care | Source: Ambulatory Visit | Attending: Student | Admitting: Student

## 2023-09-01 DIAGNOSIS — R072 Precordial pain: Secondary | ICD-10-CM | POA: Insufficient documentation

## 2023-09-01 MED ORDER — TECHNETIUM TC 99M TETROFOSMIN IV KIT
11.0700 | PACK | Freq: Once | INTRAVENOUS | Status: AC | PRN
  Administered 2023-09-01: 11.07 via INTRAVENOUS

## 2023-09-02 DIAGNOSIS — R072 Precordial pain: Secondary | ICD-10-CM | POA: Diagnosis not present

## 2023-09-02 LAB — NM MYOCAR MULTI W/SPECT W/WALL MOTION / EF
LV dias vol: 103 mL (ref 62–150)
LV sys vol: 58 mL (ref 21–61)
MPHR: 147 {beats}/min
Nuc Stress EF: 39 %
Peak HR: 70 {beats}/min
Percent HR: 47 %
Rest HR: 59 {beats}/min
Rest Nuclear Isotope Dose: 11.1 mCi
SDS: 3
SRS: 11
SSS: 9
Stress Nuclear Isotope Dose: 30.8 mCi
TID: 0.92

## 2023-09-02 MED ORDER — TECHNETIUM TC 99M TETROFOSMIN IV KIT
30.7500 | PACK | Freq: Once | INTRAVENOUS | Status: AC | PRN
  Administered 2023-09-02: 30.75 via INTRAVENOUS

## 2023-09-02 MED ORDER — REGADENOSON 0.4 MG/5ML IV SOLN
0.4000 mg | Freq: Once | INTRAVENOUS | Status: AC
  Administered 2023-09-02: 0.4 mg via INTRAVENOUS

## 2023-09-04 ENCOUNTER — Ambulatory Visit: Payer: Self-pay | Admitting: Student

## 2023-09-09 ENCOUNTER — Encounter: Payer: Self-pay | Admitting: Emergency Medicine

## 2023-09-09 NOTE — Progress Notes (Signed)
 Letter sent.

## 2024-03-17 NOTE — Progress Notes (Unsigned)
 "  Cardiology Clinic Note   Date: 03/17/2024 ID: Alexander Cobb, DOB 11-Sep-1950, MRN [id number]  Primary Cardiologist:  Evalene Lunger, MD  Chief Complaint   Alexander Cobb is a 74 y.o. adult who presents to the clinic today for ***  Patient Profile   Alexander Cobb is followed by *** for the history outlined below.       Past medical history significant for: CAD. LHC 08/07/2010 (unstable angina): PDA 75%.  Proximal RCA 30%.  EF calculated at 33%. LHC 11/09/2019 (STEMI): Proximal LCx 75%.  Mid LCx 75%.  RPDA 99%.  Proximal RCA 30%.  Mid RCA 30%.  PCI with DES 2.25 x 30 mm to PDA. Nuclear stress test 12/08/2019: Fixed medium defect in the apical inferior location due to diaphragmatic attenuation/artifact.  No ischemia.*** Nuclear stress 09/02/2023: Large in size, moderate to severe in severity fixed inferior, inferolateral, and inferior septal defect extending from the base to the apex most likely representing combination of scar and diaphragmatic attenuation.  No significant ischemia identified.  Moderately reduced LV systolic function (35 to 45% with inferior hypokinesis).  Inferior perfusion defect similar to prior study September 2021.  Intermediate risk study. Chronic systolic heart failure. Echo 11/10/2019: EF 40 to 45%.  Hypokinesis of the left ventricular entire anterolateral wall and inferior wall.  Grade I DD.  Normal RV size/function.  No significant valvular abnormalities.*** Echo 08/27/2023: EF 40 to 45%.  Grade 1 DD.  Normal RV size/function.  No significant valvular abnormalities. Palpitations/PSVT. 10-day ZIO 09/28/2021: HR 50 to 169 bpm, average 64 bpm.  1 run of VT lasting 8 beats max rate 119 bpm.  9 runs of SVT fastest 6 beats max rate 169 bpm, longest 13 beats average rate 144 bpm.  Rare ectopy. PAD. Lower extremity arterial ultrasound/ABI 01/03/2020: Right: Atherosclerosis at the aorta and iliac arteries <50% in the common iliac artery, 50 to 74% in the common femoral artery and  proximal SFA.  Three-vessel runoff.  Left: Atherosclerosis of the aorta and iliac arteries > 50% in the distal external iliac artery.  Three-vessel runoff.  ABIs demonstrated no significant lower extremity arterial disease bilaterally. Hypertension. Hyperlipidemia. Lipid panel 11/24/2023: LDL 39, HDL 37, TG 67, total 89. OSA. T2DM. Tobacco abuse.  In summary, patient has a history of CAD dating back to 2012.  He underwent LHC in May 2012 for NSTEMI which showed RPDA with 75% stenosis and proximal RCA 30% stenosis treated medically.  EF was calculated at 33%.  Follow-up echo October 2012 showed EF 40 to 45%.  In September 2016 he complained of palpitations and event monitor demonstrated runs of SVT.  In March 2020 he was admitted with respiratory failure, CHF, MSSA bacteremia requiring CPR and intubation.  TEE was negative for endocarditis.  EF is 35 to 40%.  In late August 2021 he was admitted with chest pain and STEMI.  He underwent PCI with DES to RPDA.  Echo showed EF 40 to 45% with grade 1 DD.  Stress testing September 2021 showed a fixed defect likely artifact at no ischemia.  He reported increased palpitations in June 2023 and underwent outpatient ZIO monitoring which showed runs of SVT.  Upon follow-up in February 2024 patient was doing well and no medication changes were made.  Patient was last seen in the office by me on 08/13/2023 for routine follow-up.  He reported a 55-month history of central chest pain occurring without exertion with associated dyspnea lasting minutes and resolving spontaneously.  He denied  exertional discomfort.  He reported having palpitations all of the time described as fluttering and feeling like my heart is coming out of my chest lasting for a minute and resolving spontaneously.  This was unchanged from previous although he felt it may be happening a little more frequently.  PCP had decreased carvedilol  secondary to patient feeling off balance with walking.  He denied  dizziness unless getting up too quickly.  He reported PCP stopped statin secondary to his cholesterol being good.  Patient also reported spironolactone  was stopped by PCP.  BP was well-controlled at the time of his visit.  Patient was restarted on atorvastatin .  He underwent nuclear stress test for evaluation of chest pain which was essentially unchanged from stress test in 2021.  Echo showed mildly decreased LV function unchanged from 2021.      History of Present Illness    Today, patient ***  CAD S/p PCI with DES to PDA August 2021 in the setting of STEMI.  Nuclear stress test June 2025 demonstrated no significant ischemia and fixed defect unchanged from previous in 2021.  Patient*** - Continue atorvastatin , carvedilol , aspirin , as needed SL NTG   Chronic systolic heart failure Echo June 2025 showed EF 40 to 45%, normal RV size/function, grade I DD.  Patient denies lower extremity edema, orthopnea or PND. He has chronic shortness of breath that is stable.  Euvolemic and well compensated on exam.*** -Continue carvedilol , losartan , Farxiga .   Palpitations/PSVT 10-day ZIO July 2023 demonstrated HR 50 to 169 bpm, average 64 bpm, 1 run of VT lasting 8 beats max rate 119 bpm, 9 runs of SVT fastest 6 beats max rate 169 bpm, longest 13 beats average rate 144 bpm, rare ectopy.  Patient*** - Continue carvedilol .   Hypertension BP today*** - Continue carvedilol , losartan .   Hyperlipidemia LDL 39 September 2025, at goal. - Continue atorvastatin    Tobacco abuse Patient continues to smoke. He is not interested in quitting.*** - Encouraged cessation.   ROS: All other systems reviewed and are otherwise negative except as noted in History of Present Illness.  EKGs/Labs Reviewed        No results found for requested labs within last 365 days.   No results found for requested labs within last 365 days.   No results found for requested labs within last 365 days.   No results found for  requested labs within last 365 days.  ***  Risk Assessment/Calculations    {Does this patient have ATRIAL FIBRILLATION?:[phone]} No BP recorded.  {Refresh Note OR Click here to enter BP  :1}***        Physical Exam    VS:  There were no vitals taken for this visit. , BMI There is no height or weight on file to calculate BMI.  GEN: Well nourished, well developed, in no acute distress. Neck: No JVD or carotid bruits. Cardiac: *** RRR. *** No murmur. No rubs or gallops.   Respiratory:  Respirations regular and unlabored. Clear to auscultation without rales, wheezing or rhonchi. GI: Soft, nontender, nondistended. Extremities: Radials/DP/PT 2+ and equal bilaterally. No clubbing or cyanosis. No edema ***  Skin: Warm and dry, no rash. Neuro: Strength intact.  Assessment & Plan   ***  Disposition: ***     {Are you ordering a CV Procedure (e.g. stress test, cath, DCCV, TEE, etc)?   Press F2        :[id number]}   Signed, Barnie HERO. Ravinder Lukehart, DNP, NP-C  "

## 2024-03-19 ENCOUNTER — Ambulatory Visit: Payer: Medicare (Managed Care) | Admitting: Student

## 2024-03-23 NOTE — Progress Notes (Unsigned)
 "  Cardiology Clinic Note   Date: 03/23/2024 ID: Alexander Cobb, DOB [birth year], MRN [id number]  Primary Cardiologist:  Evalene Lunger, MD  Chief Complaint   Alexander Cobb is a 74 y.o. adult who presents to the clinic today for ***  Patient Profile   Alexander Cobb is followed by *** for the history outlined below.       Past medical history significant for: CAD. LHC 08/07/2010 (unstable angina): PDA 75%.  Proximal RCA 30%.  EF calculated at 33%. LHC 11/09/2019 (STEMI): Proximal LCx 75%.  Mid LCx 75%.  RPDA 99%.  Proximal RCA 30%.  Mid RCA 30%.  PCI with DES 2.25 x 30 mm to PDA. Nuclear stress test 12/08/2019: Fixed medium defect in the apical inferior location due to diaphragmatic attenuation/artifact.  No ischemia.*** Nuclear stress 09/02/2023: Large in size, moderate to severe in severity fixed inferior, inferolateral, and inferior septal defect extending from the base to the apex most likely representing combination of scar and diaphragmatic attenuation.  No significant ischemia identified.  Moderately reduced LV systolic function (35 to 45% with inferior hypokinesis).  Inferior perfusion defect similar to prior study September 2021.  Intermediate risk study. Chronic systolic heart failure. Echo 11/10/2019: EF 40 to 45%.  Hypokinesis of the left ventricular entire anterolateral wall and inferior wall.  Grade I DD.  Normal RV size/function.  No significant valvular abnormalities.*** Echo 08/27/2023: EF 40 to 45%.  Grade 1 DD.  Normal RV size/function.  No significant valvular abnormalities. Palpitations/PSVT. 10-day ZIO 09/28/2021: HR 50 to 169 bpm, average 64 bpm.  1 run of VT lasting 8 beats max rate 119 bpm.  9 runs of SVT fastest 6 beats max rate 169 bpm, longest 13 beats average rate 144 bpm.  Rare ectopy. PAD. Lower extremity arterial ultrasound/ABI 01/03/2020: Right: Atherosclerosis at the aorta and iliac arteries <50% in the common iliac artery, 50 to 74% in the common femoral artery and  proximal SFA.  Three-vessel runoff.  Left: Atherosclerosis of the aorta and iliac arteries > 50% in the distal external iliac artery.  Three-vessel runoff.  ABIs demonstrated no significant lower extremity arterial disease bilaterally. Hypertension. Hyperlipidemia. Lipid panel 11/24/2023: LDL 39, HDL 37, TG 67, total 89. OSA. T2DM. Tobacco abuse.  In summary, patient has a history of CAD dating back to 2012.  He underwent LHC in May 2012 for NSTEMI which showed RPDA with 75% stenosis and proximal RCA 30% stenosis treated medically.  EF was calculated at 33%.  Follow-up echo October 2012 showed EF 40 to 45%.  In September 2016 he complained of palpitations and event monitor demonstrated runs of SVT.  In March 2020 he was admitted with respiratory failure, CHF, MSSA bacteremia requiring CPR and intubation.  TEE was negative for endocarditis.  EF is 35 to 40%.  In late August 2021 he was admitted with chest pain and STEMI.  He underwent PCI with DES to RPDA.  Echo showed EF 40 to 45% with grade 1 DD.  Stress testing September 2021 showed a fixed defect likely artifact at no ischemia.  He reported increased palpitations in June 2023 and underwent outpatient ZIO monitoring which showed runs of SVT.  Upon follow-up in February 2024 patient was doing well and no medication changes were made.  Patient was last seen in the office by me on 08/13/2023 for routine follow-up.  He reported a 69-month history of central chest pain occurring without exertion with associated dyspnea lasting minutes and resolving spontaneously.  He denied  exertional discomfort.  He reported having palpitations all of the time described as fluttering and feeling like my heart is coming out of my chest lasting for a minute and resolving spontaneously.  This was unchanged from previous although he felt it may be happening a little more frequently.  PCP had decreased carvedilol  secondary to patient feeling off balance with walking.  He denied  dizziness unless getting up too quickly.  He reported PCP stopped statin secondary to his cholesterol being good.  Patient also reported spironolactone  was stopped by PCP.  BP was well-controlled at the time of his visit.  Patient was restarted on atorvastatin .  He underwent nuclear stress test for evaluation of chest pain which was essentially unchanged from stress test in 2021.  Echo showed mildly decreased LV function unchanged from 2021.      History of Present Illness    Today, patient ***  CAD S/p PCI with DES to PDA August 2021 in the setting of STEMI.  Nuclear stress test June 2025 demonstrated no significant ischemia and fixed defect unchanged from previous in 2021.  Patient*** - Continue atorvastatin , carvedilol , aspirin , as needed SL NTG   Chronic systolic heart failure Echo June 2025 showed EF 40 to 45%, normal RV size/function, grade I DD.  Patient denies lower extremity edema, orthopnea or PND. He has chronic shortness of breath that is stable.  Euvolemic and well compensated on exam.*** -Continue carvedilol , losartan , Farxiga .   Palpitations/PSVT 10-day ZIO July 2023 demonstrated HR 50 to 169 bpm, average 64 bpm, 1 run of VT lasting 8 beats max rate 119 bpm, 9 runs of SVT fastest 6 beats max rate 169 bpm, longest 13 beats average rate 144 bpm, rare ectopy.  Patient*** - Continue carvedilol .   Hypertension BP today*** - Continue carvedilol , losartan .   Hyperlipidemia LDL 39 September 2025, at goal. - Continue atorvastatin    Tobacco abuse Patient continues to smoke. He is not interested in quitting.*** - Encouraged cessation.   ROS: All other systems reviewed and are otherwise negative except as noted in History of Present Illness.  EKGs/Labs Reviewed        No results found for requested labs within last 365 days.   No results found for requested labs within last 365 days.   No results found for requested labs within last 365 days.   No results found for  requested labs within last 365 days.  ***  Risk Assessment/Calculations    {Does this patient have ATRIAL FIBRILLATION?:[phone]} No BP recorded.  {Refresh Note OR Click here to enter BP  :1}***        Physical Exam    VS:  There were no vitals taken for this visit. , BMI There is no height or weight on file to calculate BMI.  GEN: Well nourished, well developed, in no acute distress. Neck: No JVD or carotid bruits. Cardiac: *** RRR. *** No murmur. No rubs or gallops.   Respiratory:  Respirations regular and unlabored. Clear to auscultation without rales, wheezing or rhonchi. GI: Soft, nontender, nondistended. Extremities: Radials/DP/PT 2+ and equal bilaterally. No clubbing or cyanosis. No edema ***  Skin: Warm and dry, no rash. Neuro: Strength intact.  Assessment & Plan   ***  Disposition: ***     {Are you ordering a CV Procedure (e.g. stress test, cath, DCCV, TEE, etc)?   Press F2        :[id number]}   Signed, Barnie HERO. Jakaden Ouzts, DNP, NP-C  "

## 2024-04-05 ENCOUNTER — Ambulatory Visit: Payer: Medicare (Managed Care) | Admitting: Student

## 2024-04-05 NOTE — Progress Notes (Unsigned)
 "  Cardiology Clinic Note   Date: 04/08/2024 ID: Alexander, Cobb Apr 13, 1950, MRN [id number]  Primary Cardiologist:  Evalene Lunger, MD  Chief Complaint   Alexander Cobb is a 74 y.o. adult who presents to the clinic today for routine follow up.   Patient Profile   Alexander Cobb is followed by Dr. Gollan for the history outlined below.       Past medical history significant for: CAD. LHC 08/07/2010 (unstable angina): PDA 75%.  Proximal RCA 30%.  EF calculated at 33%. LHC 11/09/2019 (STEMI): Proximal LCx 75%.  Mid LCx 75%.  RPDA 99%.  Proximal RCA 30%.  Mid RCA 30%.  PCI with DES 2.25 x 30 mm to PDA. Nuclear stress 09/02/2023: Large in size, moderate to severe in severity fixed inferior, inferolateral, and inferior septal defect extending from the base to the apex most likely representing combination of scar and diaphragmatic attenuation.  No significant ischemia identified.  Moderately reduced LV systolic function (35 to 45% with inferior hypokinesis).  Inferior perfusion defect similar to prior study September 2021.  Intermediate risk study. Chronic systolic heart failure. Echo 08/27/2023: EF 40 to 45%.  Grade 1 DD.  Normal RV size/function.  No significant valvular abnormalities. Palpitations/PSVT. 10-day ZIO 09/28/2021: HR 50 to 169 bpm, average 64 bpm.  1 run of VT lasting 8 beats max rate 119 bpm.  9 runs of SVT fastest 6 beats max rate 169 bpm, longest 13 beats average rate 144 bpm.  Rare ectopy. PAD. Lower extremity arterial ultrasound/ABI 01/03/2020: Right: Atherosclerosis at the aorta and iliac arteries <50% in the common iliac artery, 50 to 74% in the common femoral artery and proximal SFA.  Three-vessel runoff.  Left: Atherosclerosis of the aorta and iliac arteries > 50% in the distal external iliac artery.  Three-vessel runoff.  ABIs demonstrated no significant lower extremity arterial disease bilaterally. Hypertension. Hyperlipidemia. Lipid panel 11/24/2023: LDL 39, HDL 37, TG 67,  total 89. OSA. T2DM. Tobacco abuse.  In summary, patient has a history of CAD dating back to 2012.  He underwent LHC in May 2012 for NSTEMI which showed RPDA with 75% stenosis and proximal RCA 30% stenosis treated medically.  EF was calculated at 33%.  Follow-up echo October 2012 showed EF 40 to 45%.  In September 2016 he complained of palpitations and event monitor demonstrated runs of SVT.  In March 2020 he was admitted with respiratory failure, CHF, MSSA bacteremia requiring CPR and intubation.  TEE was negative for endocarditis.  EF is 35 to 40%.  In late August 2021 he was admitted with chest pain and STEMI.  He underwent PCI with DES to RPDA.  Echo showed EF 40 to 45% with grade 1 DD.  Stress testing September 2021 showed a fixed defect likely artifact at no ischemia.  He reported increased palpitations in June 2023 and underwent outpatient ZIO monitoring which showed runs of SVT.  Upon follow-up in February 2024 patient was doing well and no medication changes were made.  Patient was last seen in the office by me on 08/13/2023 for routine follow-up.  He reported a 19-month history of central chest pain occurring without exertion with associated dyspnea lasting minutes and resolving spontaneously.  He denied exertional discomfort.  He reported having palpitations all of the time described as fluttering and feeling like my heart is coming out of my chest lasting for a minute and resolving spontaneously.  This was unchanged from previous although he felt it may be happening a  little more frequently.  PCP had decreased carvedilol  secondary to patient feeling off balance with walking.  He denied dizziness unless getting up too quickly.  He reported PCP stopped statin secondary to his cholesterol being good.  Patient also reported spironolactone  was stopped by PCP.  BP was well-controlled at the time of his visit.  Patient was restarted on atorvastatin .  He underwent nuclear stress test for evaluation of  chest pain which was essentially unchanged from stress test in 2021.  Echo showed mildly decreased LV function unchanged from 2021.      History of Present Illness    Today, patient is here alone. He denies lower extremity edema, orthopnea or PND. He reports chronic shortness of breath unchanged from previous. No chest pain, pressure, or tightness. He reports brief palpitations all of the time. He reduced his coffee intake from 6 cups to 3 cups. He drinks diet pepsi throughout the day. He is unsure if it has caffeine. He reports occasional epigastric discomfort relieved with Tums. He shares that he decided to stop all of his medications for about a week and felt great but his wife gave him a hard time and he restarted all of them. Reviewed each medication and why he was taking them. Stressed the importance of not stopping medications.     ROS: All other systems reviewed and are otherwise negative except as noted in History of Present Illness.  EKGs/Labs Reviewed    EKG Interpretation Date/Time:  Thursday April 08 2024 14:33:03 EST Ventricular Rate:  70 PR Interval:  206 QRS Duration:  118 QT Interval:  422 QTC Calculation: 455 R Axis:   81  Text Interpretation: Normal sinus rhythm Left ventricular hypertrophy with QRS widening and repolarization abnormality ( Cornell product ) LBBB ST-t wave abnormality When compared with ECG of 13-Aug-2023 08:17, No significant change was found Confirmed by Loistine Sober [phone]) on 04/08/2024 2:44:53 PM    Physical Exam    VS:  BP (!) 140/66 (BP Location: Left Arm, Patient Position: Sitting)   Pulse 70   Ht 5' 9 (1.753 m)   Wt 179 lb 9.6 oz (81.5 kg)   SpO2 95%   BMI 26.52 kg/m  , BMI Body mass index is 26.52 kg/m.  GEN: Well nourished, well developed, in no acute distress. Neck: No JVD or carotid bruits. Cardiac:  RRR.  No murmur. No rubs or gallops.   Respiratory:  Respirations regular and unlabored. Clear to auscultation without  rales, wheezing or rhonchi. GI: Soft, nontender, nondistended. Extremities: Radials/DP/PT 2+ and equal bilaterally. No clubbing or cyanosis. No edema   Skin: Warm and dry, no rash. Neuro: Strength intact.  Assessment & Plan   CAD S/p PCI with DES to PDA August 2021 in the setting of STEMI.  Nuclear stress test June 2025 demonstrated no significant ischemia and fixed defect unchanged from previous in 2021.  Patient denies chest pain, pressure or tightness. He has occasional epigastric discomfort relieved with Tums. EKG without acute changes.  - Continue atorvastatin , carvedilol , aspirin , as needed SL NTG   Chronic systolic heart failure Echo June 2025 showed EF 40 to 45%, normal RV size/function, grade I DD.  Patient denies lower extremity edema, orthopnea or PND. He has chronic shortness of breath that is stable.  Euvolemic and well compensated on exam. -Continue carvedilol , losartan , Farxiga .   Palpitations/PSVT 10-day ZIO July 2023 demonstrated HR 50 to 169 bpm, average 64 bpm, 1 run of VT lasting 8 beats max rate 119 bpm, 9  runs of SVT fastest 6 beats max rate 169 bpm, longest 13 beats average rate 144 bpm, rare ectopy.  Patient reports brief palpitations all of the time. He reduced his coffee intake from 6 cups to 3 cups but continues to drink diet pepsi throughout the day. He is unsure if it contains caffeine. Recommended reducing caffeine intake further. RRR on exam today. HR 70 bpm.  - Continue carvedilol .   Hypertension BP today 140/66. No report of headaches or dizziness.  - Continue carvedilol , losartan .   Hyperlipidemia LDL 39 September 2025, at goal. - Continue atorvastatin    Tobacco abuse Patient continues to smoke. He is not interested in quitting. - Encouraged cessation.   Disposition: Return in 6 months or sooner as needed.          Signed, Barnie HERO. Reena Borromeo, DNP, NP-C  "

## 2024-04-08 ENCOUNTER — Ambulatory Visit: Payer: Medicare (Managed Care) | Attending: Student | Admitting: Student

## 2024-04-08 ENCOUNTER — Encounter: Payer: Self-pay | Admitting: Student

## 2024-04-08 VITALS — BP 140/66 | HR 70 | Ht 69.0 in | Wt 179.6 lb

## 2024-04-08 DIAGNOSIS — R002 Palpitations: Secondary | ICD-10-CM | POA: Diagnosis not present

## 2024-04-08 DIAGNOSIS — Z720 Tobacco use: Secondary | ICD-10-CM

## 2024-04-08 DIAGNOSIS — I5022 Chronic systolic (congestive) heart failure: Secondary | ICD-10-CM

## 2024-04-08 DIAGNOSIS — I2510 Atherosclerotic heart disease of native coronary artery without angina pectoris: Secondary | ICD-10-CM | POA: Diagnosis not present

## 2024-04-08 DIAGNOSIS — I10 Essential (primary) hypertension: Secondary | ICD-10-CM

## 2024-04-08 DIAGNOSIS — I471 Supraventricular tachycardia, unspecified: Secondary | ICD-10-CM

## 2024-04-08 DIAGNOSIS — E785 Hyperlipidemia, unspecified: Secondary | ICD-10-CM

## 2024-04-08 NOTE — Patient Instructions (Signed)
 Medication Instructions:  Your physician recommends that you continue on your current medications as directed. Please refer to the Current Medication list given to you today.   *If you need a refill on your cardiac medications before your next appointment, please call your pharmacy*  Lab Work: No labs ordered today  If you have labs (blood work) drawn today and your tests are completely normal, you will receive your results only by: MyChart Message (if you have MyChart) OR A paper copy in the mail If you have any lab test that is abnormal or we need to change your treatment, we will call you to review the results.  Testing/Procedures: No test ordered today   Follow-Up: At [facility], you and your health needs are our priority.  As part of our continuing mission to provide you with exceptional heart care, our providers are all part of one team.  This team includes your primary Cardiologist (physician) and Advanced Practice Providers or APPs (Physician Assistants and Nurse Practitioners) who all work together to provide you with the care you need, when you need it.  Your next appointment:   6 month(s)  Provider:   Timothy Gollan, MD    We recommend signing up for the patient portal called MyChart.  Sign up information is provided on this After Visit Summary.  MyChart is used to connect with patients for Virtual Visits (Telemedicine).  Patients are able to view lab/test results, encounter notes, upcoming appointments, etc.  Non-urgent messages can be sent to your provider as well.   To learn more about what you can do with MyChart, go to [email or web address].
